# Patient Record
Sex: Male | Born: 1965 | Race: White | Hispanic: No
Health system: Southern US, Community
[De-identification: ages and names within clinical notes are randomized; demographics above are authoritative.]

## PROBLEM LIST (undated history)

## (undated) DIAGNOSIS — N179 Acute kidney failure, unspecified: Secondary | ICD-10-CM

## (undated) DIAGNOSIS — I10 Essential (primary) hypertension: Secondary | ICD-10-CM

## (undated) DIAGNOSIS — I639 Cerebral infarction, unspecified: Secondary | ICD-10-CM

---

## 1989-06-21 HISTORY — PX: BACK SURGERY: SHX140

## 1999-12-10 ENCOUNTER — Emergency Department (HOSPITAL_COMMUNITY): Admission: EM | Admit: 1999-12-10 | Discharge: 1999-12-10 | Payer: Self-pay | Admitting: Emergency Medicine

## 2000-10-31 ENCOUNTER — Emergency Department (HOSPITAL_COMMUNITY): Admission: EM | Admit: 2000-10-31 | Discharge: 2000-10-31 | Payer: Self-pay | Admitting: Emergency Medicine

## 2002-06-07 ENCOUNTER — Other Ambulatory Visit (HOSPITAL_COMMUNITY): Admission: RE | Admit: 2002-06-07 | Discharge: 2002-06-08 | Payer: Self-pay | Admitting: Psychiatry

## 2002-07-30 ENCOUNTER — Encounter: Payer: Self-pay | Admitting: Emergency Medicine

## 2002-07-30 ENCOUNTER — Emergency Department (HOSPITAL_COMMUNITY): Admission: EM | Admit: 2002-07-30 | Discharge: 2002-07-30 | Payer: Self-pay | Admitting: Emergency Medicine

## 2002-11-27 ENCOUNTER — Emergency Department (HOSPITAL_COMMUNITY): Admission: EM | Admit: 2002-11-27 | Discharge: 2002-11-27 | Payer: Self-pay | Admitting: Emergency Medicine

## 2002-11-27 ENCOUNTER — Encounter: Payer: Self-pay | Admitting: Emergency Medicine

## 2021-07-25 ENCOUNTER — Encounter (HOSPITAL_COMMUNITY): Payer: Self-pay | Admitting: Emergency Medicine

## 2021-07-25 ENCOUNTER — Emergency Department (HOSPITAL_COMMUNITY): Payer: Medicaid Other

## 2021-07-25 ENCOUNTER — Other Ambulatory Visit: Payer: Self-pay

## 2021-07-25 ENCOUNTER — Inpatient Hospital Stay (HOSPITAL_COMMUNITY)
Admission: EM | Admit: 2021-07-25 | Discharge: 2021-09-11 | DRG: 064 | Disposition: A | Discharge status: Skilled Nursing Facility | Payer: Medicaid Other | Attending: Internal Medicine | Admitting: Internal Medicine

## 2021-07-25 DIAGNOSIS — R0902 Hypoxemia: Secondary | ICD-10-CM | POA: Diagnosis not present

## 2021-07-25 DIAGNOSIS — I639 Cerebral infarction, unspecified: Principal | ICD-10-CM | POA: Diagnosis present

## 2021-07-25 DIAGNOSIS — F4323 Adjustment disorder with mixed anxiety and depressed mood: Secondary | ICD-10-CM | POA: Diagnosis not present

## 2021-07-25 DIAGNOSIS — E039 Hypothyroidism, unspecified: Secondary | ICD-10-CM

## 2021-07-25 DIAGNOSIS — E035 Myxedema coma: Secondary | ICD-10-CM | POA: Diagnosis present

## 2021-07-25 DIAGNOSIS — K219 Gastro-esophageal reflux disease without esophagitis: Secondary | ICD-10-CM | POA: Diagnosis present

## 2021-07-25 DIAGNOSIS — R471 Dysarthria and anarthria: Secondary | ICD-10-CM | POA: Diagnosis present

## 2021-07-25 DIAGNOSIS — G8194 Hemiplegia, unspecified affecting left nondominant side: Secondary | ICD-10-CM | POA: Diagnosis present

## 2021-07-25 DIAGNOSIS — I63411 Cerebral infarction due to embolism of right middle cerebral artery: Principal | ICD-10-CM | POA: Diagnosis present

## 2021-07-25 DIAGNOSIS — Z20822 Contact with and (suspected) exposure to covid-19: Secondary | ICD-10-CM | POA: Diagnosis present

## 2021-07-25 DIAGNOSIS — I10 Essential (primary) hypertension: Secondary | ICD-10-CM | POA: Diagnosis present

## 2021-07-25 DIAGNOSIS — E785 Hyperlipidemia, unspecified: Secondary | ICD-10-CM | POA: Diagnosis present

## 2021-07-25 DIAGNOSIS — M62838 Other muscle spasm: Secondary | ICD-10-CM | POA: Diagnosis not present

## 2021-07-25 DIAGNOSIS — I63511 Cerebral infarction due to unspecified occlusion or stenosis of right middle cerebral artery: Secondary | ICD-10-CM

## 2021-07-25 DIAGNOSIS — Z79899 Other long term (current) drug therapy: Secondary | ICD-10-CM

## 2021-07-25 DIAGNOSIS — R29706 NIHSS score 6: Secondary | ICD-10-CM | POA: Diagnosis present

## 2021-07-25 DIAGNOSIS — N179 Acute kidney failure, unspecified: Secondary | ICD-10-CM

## 2021-07-25 DIAGNOSIS — H04123 Dry eye syndrome of bilateral lacrimal glands: Secondary | ICD-10-CM | POA: Diagnosis not present

## 2021-07-25 DIAGNOSIS — R001 Bradycardia, unspecified: Secondary | ICD-10-CM | POA: Diagnosis not present

## 2021-07-25 DIAGNOSIS — Z91041 Radiographic dye allergy status: Secondary | ICD-10-CM

## 2021-07-25 DIAGNOSIS — F17210 Nicotine dependence, cigarettes, uncomplicated: Secondary | ICD-10-CM | POA: Diagnosis present

## 2021-07-25 DIAGNOSIS — R4182 Altered mental status, unspecified: Secondary | ICD-10-CM

## 2021-07-25 DIAGNOSIS — Z9114 Patient's other noncompliance with medication regimen: Secondary | ICD-10-CM

## 2021-07-25 DIAGNOSIS — K5900 Constipation, unspecified: Secondary | ICD-10-CM | POA: Diagnosis present

## 2021-07-25 DIAGNOSIS — M25512 Pain in left shoulder: Secondary | ICD-10-CM | POA: Diagnosis not present

## 2021-07-25 DIAGNOSIS — J309 Allergic rhinitis, unspecified: Secondary | ICD-10-CM | POA: Diagnosis present

## 2021-07-25 DIAGNOSIS — R339 Retention of urine, unspecified: Secondary | ICD-10-CM | POA: Diagnosis not present

## 2021-07-25 DIAGNOSIS — G4733 Obstructive sleep apnea (adult) (pediatric): Secondary | ICD-10-CM | POA: Diagnosis present

## 2021-07-25 DIAGNOSIS — R531 Weakness: Secondary | ICD-10-CM

## 2021-07-25 DIAGNOSIS — Z8249 Family history of ischemic heart disease and other diseases of the circulatory system: Secondary | ICD-10-CM

## 2021-07-25 DIAGNOSIS — Z8673 Personal history of transient ischemic attack (TIA), and cerebral infarction without residual deficits: Secondary | ICD-10-CM

## 2021-07-25 DIAGNOSIS — R29810 Facial weakness: Secondary | ICD-10-CM | POA: Diagnosis present

## 2021-07-25 DIAGNOSIS — T428X5A Adverse effect of antiparkinsonism drugs and other central muscle-tone depressants, initial encounter: Secondary | ICD-10-CM | POA: Diagnosis not present

## 2021-07-25 DIAGNOSIS — R0789 Other chest pain: Secondary | ICD-10-CM

## 2021-07-25 DIAGNOSIS — G928 Other toxic encephalopathy: Secondary | ICD-10-CM | POA: Diagnosis not present

## 2021-07-25 DIAGNOSIS — Q2112 Patent foramen ovale: Secondary | ICD-10-CM

## 2021-07-25 DIAGNOSIS — R4701 Aphasia: Secondary | ICD-10-CM | POA: Diagnosis present

## 2021-07-25 HISTORY — DX: Cerebral infarction, unspecified: I63.9

## 2021-07-25 HISTORY — DX: Essential (primary) hypertension: I10

## 2021-07-25 HISTORY — DX: Acute kidney failure, unspecified: N17.9

## 2021-07-25 LAB — COMPREHENSIVE METABOLIC PANEL
ALT: 27 U/L (ref 0–44)
AST: 32 U/L (ref 15–41)
Albumin: 4.2 g/dL (ref 3.5–5.0)
Alkaline Phosphatase: 62 U/L (ref 38–126)
Anion gap: 12 (ref 5–15)
BUN: 20 mg/dL (ref 6–20)
CO2: 25 mmol/L (ref 22–32)
Calcium: 9.3 mg/dL (ref 8.9–10.3)
Chloride: 103 mmol/L (ref 98–111)
Creatinine, Ser: 1.39 mg/dL — ABNORMAL HIGH (ref 0.61–1.24)
GFR, Estimated: 59 mL/min — ABNORMAL LOW (ref >60)
Glucose, Bld: 138 mg/dL — ABNORMAL HIGH (ref 70–99)
Potassium: 3.8 mmol/L (ref 3.5–5.1)
Sodium: 140 mmol/L (ref 135–145)
Total Bilirubin: 0.6 mg/dL (ref 0.3–1.2)
Total Protein: 7.4 g/dL (ref 6.5–8.1)

## 2021-07-25 LAB — I-STAT CHEM 8, ED
BUN: 24 mg/dL — ABNORMAL HIGH (ref 6–20)
Calcium, Ion: 0.94 mmol/L — ABNORMAL LOW (ref 1.15–1.40)
Chloride: 105 mmol/L (ref 98–111)
Creatinine, Ser: 1.2 mg/dL (ref 0.61–1.24)
Glucose, Bld: 141 mg/dL — ABNORMAL HIGH (ref 70–99)
HCT: 44 % (ref 39.0–52.0)
Hemoglobin: 15 g/dL (ref 13.0–17.0)
Potassium: 3.7 mmol/L (ref 3.5–5.1)
Sodium: 139 mmol/L (ref 135–145)
TCO2: 26 mmol/L (ref 22–32)

## 2021-07-25 LAB — CBC
HCT: 44.2 % (ref 39.0–52.0)
Hemoglobin: 14.2 g/dL (ref 13.0–17.0)
MCH: 31.3 pg (ref 26.0–34.0)
MCHC: 32.1 g/dL (ref 30.0–36.0)
MCV: 97.6 fL (ref 80.0–100.0)
Platelets: 219 10*3/uL (ref 150–400)
RBC: 4.53 MIL/uL (ref 4.22–5.81)
RDW: 14.4 % (ref 11.5–15.5)
WBC: 7.3 10*3/uL (ref 4.0–10.5)
nRBC: 0 % (ref 0.0–0.2)

## 2021-07-25 LAB — RAPID URINE DRUG SCREEN, HOSP PERFORMED
Amphetamines: POSITIVE — AB
Barbiturates: NOT DETECTED
Benzodiazepines: NOT DETECTED
Cocaine: NOT DETECTED
Opiates: NOT DETECTED
Tetrahydrocannabinol: NOT DETECTED

## 2021-07-25 LAB — RESP PANEL BY RT-PCR (FLU A&B, COVID) ARPGX2
Influenza A by PCR: NEGATIVE
Influenza B by PCR: NEGATIVE
SARS Coronavirus 2 by RT PCR: NEGATIVE

## 2021-07-25 LAB — DIFFERENTIAL
Abs Immature Granulocytes: 0.03 10*3/uL (ref 0.00–0.07)
Basophils Absolute: 0.1 10*3/uL (ref 0.0–0.1)
Basophils Relative: 1 %
Eosinophils Absolute: 0.1 10*3/uL (ref 0.0–0.5)
Eosinophils Relative: 2 %
Immature Granulocytes: 0 %
Lymphocytes Relative: 18 %
Lymphs Abs: 1.3 10*3/uL (ref 0.7–4.0)
Monocytes Absolute: 0.4 10*3/uL (ref 0.1–1.0)
Monocytes Relative: 6 %
Neutro Abs: 5.4 10*3/uL (ref 1.7–7.7)
Neutrophils Relative %: 73 %

## 2021-07-25 LAB — HEMOGLOBIN A1C
Hgb A1c MFr Bld: 5.9 % — ABNORMAL HIGH (ref 4.8–5.6)
Mean Plasma Glucose: 122.63 mg/dL

## 2021-07-25 LAB — LIPID PANEL
Cholesterol: 320 mg/dL — ABNORMAL HIGH (ref 0–200)
HDL: 50 mg/dL (ref >40)
LDL Cholesterol: 244 mg/dL — ABNORMAL HIGH (ref 0–99)
Total CHOL/HDL Ratio: 6.4 RATIO
Triglycerides: 132 mg/dL (ref <150)
VLDL: 26 mg/dL (ref 0–40)

## 2021-07-25 LAB — APTT: aPTT: 32 seconds (ref 24–36)

## 2021-07-25 LAB — PROTIME-INR
INR: 1 (ref 0.8–1.2)
Prothrombin Time: 12.8 seconds (ref 11.4–15.2)

## 2021-07-25 LAB — GLUCOSE, CAPILLARY: Glucose-Capillary: 107 mg/dL — ABNORMAL HIGH (ref 70–99)

## 2021-07-25 LAB — CBG MONITORING, ED: Glucose-Capillary: 134 mg/dL — ABNORMAL HIGH (ref 70–99)

## 2021-07-25 LAB — TROPONIN I (HIGH SENSITIVITY): Troponin I (High Sensitivity): 8 ng/L (ref <18)

## 2021-07-25 MED ORDER — ACETAMINOPHEN 650 MG RE SUPP: 650.0000 mg | Freq: Four times a day (QID) | RECTAL | Status: DC | PRN

## 2021-07-25 MED ORDER — ENOXAPARIN SODIUM 40 MG/0.4ML IJ SOSY
40.0000 mg | PREFILLED_SYRINGE | INTRAMUSCULAR | Status: DC
  Administered 2021-07-25: 40 mg via SUBCUTANEOUS
  Filled 2021-07-25: qty 0.4

## 2021-07-25 MED ORDER — ASPIRIN 81 MG PO CHEW: 81.0000 mg | CHEWABLE_TABLET | Freq: Every day | ORAL | Status: DC

## 2021-07-25 MED ORDER — ASPIRIN 81 MG PO CHEW
324.0000 mg | CHEWABLE_TABLET | Freq: Once | ORAL | Status: AC
  Administered 2021-07-25: 324 mg via ORAL

## 2021-07-25 MED ORDER — CLOPIDOGREL BISULFATE 300 MG PO TABS
300.0000 mg | ORAL_TABLET | Freq: Once | ORAL | Status: AC
  Administered 2021-07-25: 300 mg via ORAL
  Filled 2021-07-25: qty 1

## 2021-07-25 MED ORDER — ACETAMINOPHEN 325 MG PO TABS
650.0000 mg | ORAL_TABLET | Freq: Four times a day (QID) | ORAL | Status: DC | PRN
  Administered 2021-07-25 – 2021-08-30 (×24): 650 mg via ORAL
  Filled 2021-07-25 (×24): qty 2

## 2021-07-25 MED ORDER — GABAPENTIN 600 MG PO TABS
300.0000 mg | ORAL_TABLET | Freq: Three times a day (TID) | ORAL | Status: DC
  Filled 2021-07-25 (×2): qty 0.5

## 2021-07-25 MED ORDER — SODIUM CHLORIDE 0.9% FLUSH
3.0000 mL | Freq: Once | INTRAVENOUS | Status: AC
  Administered 2021-07-25: 3 mL via INTRAVENOUS

## 2021-07-25 MED ORDER — CYCLOBENZAPRINE HCL 10 MG PO TABS
5.0000 mg | ORAL_TABLET | Freq: Every day | ORAL | Status: DC | PRN
  Administered 2021-07-25 – 2021-07-27 (×3): 5 mg via ORAL
  Filled 2021-07-25 (×3): qty 1

## 2021-07-25 MED ORDER — ASPIRIN 81 MG PO CHEW
81.0000 mg | CHEWABLE_TABLET | Freq: Every day | ORAL | Status: DC
  Administered 2021-07-26 – 2021-09-11 (×48): 81 mg via ORAL
  Filled 2021-07-25 (×48): qty 1

## 2021-07-25 MED ORDER — CLOPIDOGREL BISULFATE 75 MG PO TABS
75.0000 mg | ORAL_TABLET | Freq: Every day | ORAL | Status: AC
  Administered 2021-07-26 – 2021-08-15 (×21): 75 mg via ORAL
  Filled 2021-07-25 (×21): qty 1

## 2021-07-25 MED ORDER — GABAPENTIN 300 MG PO CAPS
300.0000 mg | ORAL_CAPSULE | Freq: Three times a day (TID) | ORAL | Status: DC
  Administered 2021-07-25 – 2021-08-01 (×22): 300 mg via ORAL
  Filled 2021-07-25 (×22): qty 1

## 2021-07-25 MED ORDER — ATORVASTATIN CALCIUM 80 MG PO TABS
80.0000 mg | ORAL_TABLET | Freq: Every day | ORAL | Status: DC
  Administered 2021-07-26 – 2021-09-11 (×48): 80 mg via ORAL
  Filled 2021-07-25 (×48): qty 1

## 2021-07-25 MED ORDER — ASPIRIN 81 MG PO CHEW: 81.0000 mg | CHEWABLE_TABLET | Freq: Once | ORAL | Status: DC

## 2021-07-25 NOTE — ED Notes (Signed)
The pt just returned from [facility]  admitting doctors met the pt

## 2021-07-25 NOTE — Progress Notes (Signed)
Patient received to room [phone].  Is A&O x 4.  Introduced to Education officer, community and to routine.  Bed in low position.  Brakes on and high fall risk protocol initiated.  Pt states is incontinent of urine.  Male external catheter in place and functioning.

## 2021-07-25 NOTE — Hospital Course (Addendum)
Feels fine today. Still having muscle spasms in his legs.   Says PT thinks he is getting better, but he does not. 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  Patient noted onset of symptoms the evening prior to presentation however did not come to the hospital until left extremity weakness continued to progress.  On presentation neurology was consulted for code stroke.  Patient was outside the thrombolytic window, however was loaded with aspirin and Plavix.  MRI positive for scattered acute infarcts concerning for possible embolic process.  Multiple chronic infarcts and chronic ischemic disease also noted.  No evidence of large vessel occlusion on MRA.  Several areas of intracranial stenosis, most severe in branches of the right and left PCA.  Patient hypertensive on arrival, not treated given need for permissive hypertension.  Heart rate in the 50s to 60s.  EKG sinus rhythm.  On this morning's evaluation blood pressure has normalized without intervention.  Hemoglobin A1c in the prediabetic range at 5.9%.  Lipid panel with elevated cholesterol at 320, LDL elevated at 244, HDL 50.  TSH significantly elevated at 41 with free T4 undetectable.  Creatinine elevated at 1.39 on admission with GFR 59.  Not significantly changed on this morning's labs, however unclear patient's baseline due to limited prior records although suspect this may be patient's baseline given no significant reason for AKI noted.  UDS positive for amphetamines, patient reported taking Adderall several days ago. Started on high intensity statin for hyperlipidemia.  We will hold off on additional blood pressure medication given normal pressures and carefully lowering  pressure in the setting of stroke.  Neuro exam still notable for left facial droop, dysarthria, significant weakness of left upper and lower extremities this morning.  Able to minimally move left upper, no movement noted of the left lower extremity.  PT/OT/speech ordered.  - TEE revealing large PFO with R to L shunting; no LA/LAA thrombus or masses. ROPE score 4, not ideal candidate for closure. Plans for pt to f/u with Dr Excell Seltzer as an OP. Plan to 30 day loop recording.  Denied by [facility] d/t poor support at home.  Did have an incidence of altered mentation including repetitive speech and worsening aphasia from baseline about 1 week post stroke; Pt oriented to person and place but not time. Code stroke called. Repeat CT imaging negative for hemorrhage. MRI brain and MRA head and neck, and EEG ordered. MRI brain showed New small acute cortical infarct at the right temporal operculum, but stroke team not convinced this was cause of acute change. Believe that recent increase in baclofen could by cause of acute toxic encephalopathy given sudden onset of confusion, disorientation, hallucinations, and paranoia. Discontinued baclofen and gabapentin; labs reassuring and CXR negative, and then slowly resumed baclofen given ongoing muscle spasm.  - Aspirin 81mg  daily - Clopidogrel 75mg  daily for 3 weeks - Atorvastatin 80 mg  - PT/OT/SLP Pt discharged to SNF. ***  HTN Difficult to manage HTN after stroke that required up-titration of Lisinopril from 20 to 40, addition and up-titration of amlodipine from 5 to 10, and then addition of HCTZ 12.5 daily. HCTZ was discontinued d/t AKI after starting and substituted with Hydral 10 mg TID. HR did not allow for starting a BB.  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 Tx for possible MC with IV steroids and IV synthroid. Improved lethargy and bradycardia with IV synthroid. Next day AM cortisol elevated, D/C IV steroids.  Free T4 improved to 0.41 with IV Synthroid. Improved lethargy and bradycardia, and transitioned to PO synthroid today.  - PO synthroid 100 mcg starting tmrw  - Recheck TSH and free T4 in 4-6 weeks  Chronic pain  Muscle spasm  Complaining of lower leg muscle spasms since his stroke. Tried Baclofen and Flexeril with minimal improvement. Spoke with stroke team, advised to increase and baclofen dose and to schedule it. Given net onset confusion, baclofen and gabapentin discontinued.

## 2021-07-25 NOTE — ED Notes (Signed)
The pt is c/o an increase of the spasm of his lt leg  the pt reports that  the movement is getting more frequent and it hurts

## 2021-07-25 NOTE — Consult Note (Signed)
Encountered pt's daughter in hall, lost and looking for her father's room. Encouraged her and showed the way. Will ask day chaplain to F/U tomorrow.  Rev. Donnel Saxon Chaplain

## 2021-07-25 NOTE — ED Notes (Signed)
Patient transported to MRI 

## 2021-07-25 NOTE — ED Notes (Signed)
Pt still in MRI at this time

## 2021-07-25 NOTE — ED Notes (Signed)
I have paged x 2 in the past 20-30 minutes  for the pt who is almost crying with cramps in his lt leg and spasms  the pt needs med for this pain

## 2021-07-25 NOTE — Progress Notes (Addendum)
° °  Subjective Placed on 2L O2 by RN due to de-saturation 84-88% at times during apnea overnight. Oxygen saturation remained 92% or higher with 2L of supplemental oxygen. Found to be retaining urine, with bladder scan showing , refused in and out. Agreed to condom cath this AM.   Patient was seen at bedside during rounds today. He is sleepy but awakes to voice. He has noticed no improvement in weakness. He denies any confusion.   Pt is updated on the plan for today, and all questions and concerns are addressed.   Objective:  Vital signs in last 24 hours: Vitals:   07/26/21 0427 07/26/21 0430 07/26/21 0826 07/26/21 0900  BP: (!) 166/83 (!) 166/83 119/78   Pulse: (!) 54 (!) 54 (!) 51   Resp: 12 16 10    Temp:  98.2 F (36.8 C) 98.3 F (36.8 C)   TempSrc:  Axillary Axillary   SpO2: 99% 99% 100% 98%  Weight:      Height:       Constitutional: alert, well-appearing, in NAD  Cardiovascular: RRR, no m/r/g, non-edematous bilateral LE Pulmonary/Chest: normal work of breathing on room air, LCTAB Abdominal: soft, non-tender to palpation, non-distended MSK: normal bulk and tone Skin: warm and dry Neurological: A&O x 3, follows commands Left facial droop, tongue deviates slightly the left, slurred speech. Unable to move left upper and lower extremities and 5/5 in upper and lower right extremities. Good hand grip bilaterally. Sensation intact.  Assessment/Plan:  Principal Problem:   Left-sided weakness Active Problems:   Stroke [facility])   Multifocal ischemic brain infarcts Suspecting embolic stroke Suspect embolic etiology given MRI brain with acute infarcts within the right frontal lobe, right corona radiata/basal ganglia, left corona radiata, medial left temporal lobe/hippocampus and left aspect of the callosal genu. MRA head negative for large vessel occlusion. MRA neck without significant stenosis. DVT study negative. LDL 244 and A1c 5.9. Exam largely unchanged from yesterday. BP  119/78, will hold off on any antihypertensives at this time.  - Neurology following, appreciated assistance - Aspirin 81mg  daily - Clopidogrel 75mg  daily for 3 weeks - Atorvastatin 80 mg  - Allow for permissive HTN first 24 hrs  - Echo pending  - PT/OT/SLP   Uncontrolled hypothyroidism TSH 41, with Free T4 <0.25. Self reports that he previously had thyroid ablation d/t "cancer" and was taking "medication" after that until he could no longer afford it. Uncontrolled hypothyroidism can explain his elevated LDL.  - Started synthroid 75 mcg  - Recheck TSH in 4-6 weeks  Urinary retention Retained 400cc last night, but refused in and out. Pt agreeable to condom cath this AM. Denies abdominal pain.  - Condom cath  Chronic pain  Muscle spasm  - Continue gabapentin 300mg  tid  - Continue Flexeril 5mg  prn    Best Practice: Diet: NPO IVF: None,None VTE: Levenox 40 Code: Full   [facility], MD  Internal Medicine Resident, PGY-1 Pager: [phone] After 5pm on weekdays and 1pm on weekends: On Call pager [phone]

## 2021-07-25 NOTE — ED Triage Notes (Signed)
Patient BIB [facility] from home. Complaint of left facial droop, and left sided weakness that started 1700 last night. VSS. HX TIA, HTN.

## 2021-07-25 NOTE — ED Provider Notes (Signed)
[facility] Provider Note   CSN: [id number] Arrival date & time: 07/25/21  1155  An emergency department physician performed an initial assessment on this suspected stroke patient at 1158.  History  CC: Left sided paresthesias   Jason SaferCharles K Figueroa is a 56 y.o. male who reports a history of TIA, presenting from home by EMS for concern for left-sided paresthesias.  Patient reports around 5 PM yesterday he noted some tingling and numbness on the left half of his face.  It then extended to involve the left arm and the left leg, which he feels are weaker than normal.  His symptoms have persisted overnight.  He denies headache, denies lightheadedness, denies chest pressure.  He arrives as a code stroke  He states that he takes 81 mg of aspirin daily but no blood thinner  He does report iodine "allergies" with "my neck starts to feel hot and my blood pressure shoots up and I start to feel crazy".  HPI     Home Medications Prior to Admission medications   Medication Sig Start Date End Date Taking? Authorizing Provider  Aromatic Inhalants ([employer] NASAL DECONGESTANT IN) Inhale 1 spray into the lungs as needed (congestion).   Yes [provider]  gabapentin (NEURONTIN) 600 MG tablet Take 600 mg by mouth daily.   Yes [provider]  lisinopril (ZESTRIL) 20 MG tablet Take 20 mg by mouth daily.   Yes [provider]  lisinopril (ZESTRIL) 40 MG tablet Take 40 mg by mouth daily.   Yes [provider]      Allergies    Ivp dye [iodinated contrast media]    Review of Systems   Review of Systems  Physical Exam Updated Vital Signs BP (!) 179/96    Pulse (!) 50    Temp 98 F (36.7 C) (Oral)    Resp 11    Ht 5\' 9"  (1.753 m)    Wt 89.3 kg    SpO2 93%    BMI 29.07 kg/m  Physical Exam Constitutional:      General: He is not in acute distress. HENT:     Head: Normocephalic and atraumatic.  Eyes:     Conjunctiva/sclera:  Conjunctivae normal.     Pupils: Pupils are equal, round, and reactive to light.  Cardiovascular:     Rate and Rhythm: Normal rate and regular rhythm.  Pulmonary:     Effort: Pulmonary effort is normal. No respiratory distress.  Abdominal:     General: There is no distension.     Tenderness: There is no abdominal tenderness.  Skin:    General: Skin is warm and dry.  Neurological:     Mental Status: He is alert and oriented to person, place, and time.     Motor: No weakness.     Comments: Patient reporting left-sided face, left arm and leg paresthesia    ED Results / Procedures / Treatments   Labs (all labs ordered are listed, but only abnormal results are displayed) Labs Reviewed  COMPREHENSIVE METABOLIC PANEL - Abnormal; Notable for the following components:      Result Value   Glucose, Bld 138 (*)    Creatinine, Ser 1.39 (*)    GFR, Estimated 59 (*)    All other components within normal limits  I-STAT CHEM 8, ED - Abnormal; Notable for the following components:   BUN 24 (*)    Glucose, Bld 141 (*)    Calcium, Ion 0.94 (*)  All other components within normal limits  CBG MONITORING, ED - Abnormal; Notable for the following components:   Glucose-Capillary 134 (*)    All other components within normal limits  RESP PANEL BY RT-PCR (FLU A&B, COVID) ARPGX2  PROTIME-INR  APTT  CBC  DIFFERENTIAL  RAPID URINE DRUG SCREEN, HOSP PERFORMED  TROPONIN I (HIGH SENSITIVITY)    EKG EKG Interpretation  Date/Time:  Saturday July 25 2021 12:17:49 EST Ventricular Rate:  57 PR Interval:  167 QRS Duration: 103 QT Interval:  437 QTC Calculation: 426 R Axis:   105 Text Interpretation: Sinus rhythm No STEMI, no prior tracing on MUSE for comparison Confirmed by Alvester Chou [phone]) on 07/25/2021 12:35:19 PM  Radiology CT HEAD CODE STROKE WO CONTRAST  Result Date: 07/25/2021 CLINICAL DATA:  Code stroke. Provided history: Neuro deficit, acute, stroke suspected. EXAM: CT HEAD  WITHOUT CONTRAST TECHNIQUE: Contiguous axial images were obtained from the base of the skull through the vertex without intravenous contrast. RADIATION DOSE REDUCTION: This exam was performed according to the departmental dose-optimization program which includes automated exposure control, adjustment of the mA and/or kV according to patient size and/or use of iterative reconstruction technique. COMPARISON:  No pertinent prior exams available for comparison. FINDINGS: Brain: Cerebral volume is normal for age. Patchy and ill-defined hypoattenuation within the cerebral white matter, nonspecific but compatible with chronic small vessel ischemic disease. Small age-indeterminate lacunar infarcts within the bilateral basal ganglia, right thalamus and right pons. There is no acute intracranial hemorrhage. No demarcated cortical infarct. No extra-axial fluid collection. No evidence of an intracranial mass. No midline shift. Vascular: No hyperdense vessel.  Atherosclerotic calcifications. Skull: Normal. Negative for fracture or focal lesion. Sinuses/Orbits: Visualized orbits show no acute finding. Small volume frothy secretions within a posterior right ethmoid air cell. Trace thickening elsewhere within the bilateral ethmoid sinuses. Small volume frothy secretions within the left sphenoid sinus. ASPECTS ([place]berta Stroke Program Early CT Score) - Ganglionic level infarction (caudate, lentiform nuclei, internal capsule, insula, M1-M3 cortex): 6 - Supraganglionic infarction (M4-M6 cortex): 3 Total score (0-10 with 10 being normal): 9 These results were communicated to Dr. Thomasena Edis At 12:14 pmon 2/4/2023by text page via the [facility] messaging system. IMPRESSION: No acute intracranial hemorrhage or acute demarcated cortical infarction. Age-indeterminate lacunar infarcts within the bilateral basal ganglia, right thalamus and right pons. Chronic small vessel ischemic changes within the cerebral white matter. Electronically Signed   By:  Jackey Loge D.O.   On: 07/25/2021 12:14    Procedures Procedures    Medications Ordered in ED Medications  clopidogrel (PLAVIX) tablet 300 mg (has no administration in time range)  sodium chloride flush (NS) 0.9 % injection 3 mL (3 mLs Intravenous Given 07/25/21 1225)  aspirin chewable tablet 324 mg (324 mg Oral Given 07/25/21 1223)    ED Course/ Medical Decision Making/ A&P Clinical Course as of 07/25/21 1445  Sat Jul 25, 2021  1211 Accucheck wnl on arrival [MT]  1235 Neurology is recommending a Plavix on a dose of 200 mg as well as aspirin, admission to the hospitalist for MRI of the brain, MRA of the head and neck (ordered), and stroke workup [MT]  1251 Pt not a candidate for tpa with timing & onset of symptoms [MT]  1347 Will repage as unassigned - no PCP [MT]  1409 Admitted to IM service. [MT]  1410 Aspirin already given on arrival - I cancelled addition 81 mg ordered here [MT]    Clinical Course User Index [MT] Terald Sleeper,  MD                           Medical Decision Making Amount and/or Complexity of Data Reviewed Labs: ordered. Radiology: ordered.  Risk Prescription drug management. Decision regarding hospitalization.   Patient is here with acute left-sided paresthesias of the face, arm and leg.  Differential would include CVA versus TIA versus other.  He has no chest pain or pressure to suggest ACS or aortic dissection.  Neurology was consulted regarding this patient's presentation, recommend CT imaging initially, which did show likely acute infarct consistent with the patient's symptoms.  Neurologist subsequently recommended medical admission for MRI and MRI and further stroke work-up.  I reviewed interpreted the remainder the patient's labs.  COVID and flu are negative.  BMP and CBC largely unremarkable.  Creatinine 1.4.  EKG shows normal sinus rhythm without acute ischemic findings per my interpretation.  CT scan of the brain reviewed and interpreted and  agree with radiology interpretation, with age-indeterminate lacunar infarcts.  Patient was admitted to the medical team.  Transition of care discussed with the internal medicine service         Final Clinical Impression(s) / ED Diagnoses Final diagnoses:  Cerebrovascular accident (CVA), unspecified mechanism [facility])    Rx / DC Orders ED Discharge Orders     None         Chauncey Sciulli, Kermit Balo, MD 07/25/21 1445

## 2021-07-25 NOTE — ED Notes (Signed)
I received  a call back from the admitting doctor.. med given now  pts cramps have not subsided

## 2021-07-25 NOTE — Consult Note (Addendum)
Neurology Consult H&P  Jason Figueroa MR# [id number] 07/25/2021   CC: 56 year old male patient with history of HTN , previous stroke and recent TIA who was brought in to [facility] ED as a Code Stroke via EMS for complaint of tingling and numbness on left side of his face that progressed to left facial droop since yesterday at 5pm. These symptoms worsened over the night and this am he noted Left arm and leg weakness today. He had a TIA some months ago and unsure if these are similar symptoms. His leg weakness had resolved by the time he was in CT scan. He denies headache, denies lightheadedness, denies chest pressure  History is obtained from: Patient, ED physician, EMS and chart.  HPI: Jason Figueroa is a 56 y.o. male PMHx of HTN, previous stroke and recent TIA. I was unable to find any further hx of his stroke ( other than confirmed old lacunar strokes on today's [facility]) or TIA in care everywhere. Neurology is consulted for code stroke.   Met patient at ED doors with team. NIHSS- 6 with left sided deficits. Patient alert and oriented x3. BP 196/108 at that time. He says he took ASA 77m last night and not on AC.  Of note he says he has an iodinated contrast allergy or intolerance that caused him flushing of the neck, HTN and he "started to feel crazy".    LKW: 500pm 07/24/21 tNK given: No  IR Thrombectomy No, not indicated Modified Rankin Scale: 4-Needs assistance to walk and tend to bodily needs NIHSS: 6 Interval: Initial (02/04 1217) Level of Consciousness (1a.)   : Alert, keenly responsive (02/04 1217) LOC Questions (1b. )   +: Answers both questions correctly (02/04 1217) LOC Commands (1c. )   + : Performs both tasks correctly (02/04 1217) Best Gaze (2. )  +: Normal (02/04 1217) Visual (3. )  +: No visual loss (02/04 1217) Facial Palsy (4. )    : Minor paralysis (02/04 1217) Motor Arm, Left (5a. )   +: No effort against gravity (02/04 1217) Motor Arm, Right (5b. )   +: No drift (02/04  1217) Motor Leg, Left (6a. )   +: Some effort against gravity (02/04 1217) Motor Leg, Right (6b. )   +: No drift (02/04 1217) Limb Ataxia (7. ): Absent (02/04 1217) Sensory (8. )   +: Normal, no sensory loss (02/04 1217) Best Language (9. )   +: No aphasia (02/04 1217) Dysarthria (10. ): Normal (02/04 1217) Extinction/Inattention (11.)   +: No Abnormality (02/04 1217) Modified SS Total  +: 5 (02/04 1217) Complete NIHSS TOTAL: 6 (02/04 1217)  ROS: A complete ROS was performed and is negative except as noted in the HPI.    Past Medical History:  Diagnosis Date   Hypertension    Stroke ([facility]      No family history on file.  Social History:  reports that he has been smoking cigarettes. He has never used smokeless tobacco. No history on file for alcohol use and drug use.   Prior to Admission medications   Not on File    Exam: Current vital signs: Ht _0  (1.753 m)    Wt 89.3 kg    BMI 29.07 kg/m   Physical Exam  Constitutional: Appears well-developed and well-nourished.  Psych: Affect appropriate to situation Eyes: No scleral injection HENT: No OP obstruction. Head: Normocephalic.  Cardiovascular: Normal rate and regular rhythm.  Respiratory: Effort normal, symmetric excursions bilaterally,  no audible wheezing. GI: Soft.  No distension. There is no tenderness.  Skin: WDI  Neuro: Mental Status: Patient is awake, alert, oriented to person, place, month, year, and situation. Patient is able to give a clear and coherent history. Speech is fluent, intact comprehension and repetition. No signs of aphasia or neglect. Visual Fields are full. Pupils are equal, round, and reactive to light. EOMI without ptosis or diploplia.  Facial sensation is asymmetric to temperature Facial movement is asymmetric.  Hearing is intact to voice. Uvula midline and palate elevates symmetrically. Shoulder shrug is symmetric. Tongue is midline without atrophy or fasciculations.  Tone is  normal. Bulk is normal. 5/5 strength was present in all four extremities except left arm.  Sensation is symmetric to light touch and temperature in the arms and legs. Toes are downgoing bilaterally. FNF and HKS are intact bilaterally. Gait - Deferred  I have reviewed labs in epic and the pertinent results are: Creatinine 1.39, BUN 24, GFR 59.  I have reviewed the images obtained: NCT head showed No acute intracranial hemorrhage or acute demarcated cortical infarction.Age-indeterminate lacunar infarcts within the bilateral basal ganglia, right thalamus and right pons.Chronic small vessel ischemic changes within the cerebral white matter.  Assessment: Jason Figueroa is a 56 y.o. male PMHx of HTN, prior lacunar strokes and recent TIA, brought in to [facility] ED as a Code Stroke via EMS for complaint of tingling and numbness on left side of his face that progressed to left facial droop since yesterday at 5pm. These symptoms worsened over the night and this am he noted Left arm and leg weakness today. He had a TIA some months ago and unsure if these are similar symptoms. His leg weakness had resolved by the time he was in CT scan. He denied headache,lightheadedness or chest pressure.   Impression:  Right MCA territory stroke likely small vessel disease   Plan: - MRI brain without contrast. - Vascular imaging with MRA head and neck  - Recommend TTE. - Recommend labs: HbA1c, lipid panel, TSH. - Recommend Statin if LDL > 70 - Recommended load with aspirin 351m and Plavix 3091mnow. - Aspirin 8110maily. - Clopidogrel 87m79mily for 3 weeks. - Permissive hypertension first 24 h < 220/110.  - Telemetry monitoring for arrhythmia. - Recommend bedside Swallow screen. - Recommend Stroke education. - Recommend PT/OT/SLP consult.  This patient is critically ill and at significant risk of neurological worsening, death and care requires constant monitoring of vital signs, hemodynamics,respiratory and  cardiac monitoring, neurological assessment, discussion with family, other specialists and medical decision making of high complexity. I spent 70 minutes of neurocritical care time  in the care of  this patient. This was time spent independent of any time provided by nurse practitioner or PA.  Electronically signed by:  HuntLynnae Sandhoff Page: [id number]/2023, 12:17 PM NP co-rounder: JessParke Poisson  If 7pm- 7am, please page neurology on call as [place] [facility]

## 2021-07-25 NOTE — H&P (Addendum)
Date: 07/25/2021               Patient Name:  Jason Figueroa MRN: [id number]  DOB: 11/17/1965 Age / Sex: 56 y.o., male   PCP: Pcp, No         Medical Service: Internal Medicine Teaching Service         Attending Physician: Dr. Saverio Danker     First Contact: Lajean Manes, MD Pager: [phone]  Second Contact: Rick Duff, MD Pager: [phone]       After Hours (After 5p/  First Contact Pager: [phone]  weekends / holidays): Second Contact Pager: [phone]    Chief Complaint: Weakness   History of Present Illness:  Jason Figueroa is a 56 year old male patient with pertinent PMHx of HTN, previous stroke,  and recent TIA who was brought in to [facility] ED as a Code Stroke via EMS for complaint of tingling and numbness on left side of his face that progressed to left facial droop since yesterday at 5pm. He reports that these symptoms continued to progress throughout the night, and this morning he noticed left arm and leg weakness. Neurology was consulted for code stroke.   Initial ED workup with BP 196/108. CTH showing no acute intracranial hemorrhage or acute demarcated cortical infarction; age-indeterminate lacunar infarcts within the bilateral basal ganglia, right thalamus and right pons; chronic small vessel ischemic changes within the cerebral white matter. ASA and Plavix given. IMTS called for admission for further stroke workup.   He denies headaches, vision changes, lightheadedness, chest pain, SHOB, abdominal pain, nausea and vomiting.   He has a hx of previous strokes and TIA, but unable to find any further hx of his stroke or TIA via Care Everywhere, but CT imaging does confirm old lacunar strokes. Per chart review, after his TIA, he was instructed to continue taking ASA, but he has not been doing this.   Meds:  Poor medication compliance; reports taking his mother's Lisinopril 20 or 40 from "time to time"  No current facility-administered medications on file prior to encounter.    Current Outpatient Medications on File Prior to Encounter  Medication Sig Dispense Refill   Aromatic Inhalants ([employer] NASAL DECONGESTANT IN) Inhale 1 spray into the lungs as needed (congestion).     gabapentin (NEURONTIN) 600 MG tablet Take 600 mg by mouth daily.     lisinopril (ZESTRIL) 20 MG tablet Take 20 mg by mouth daily.     lisinopril (ZESTRIL) 40 MG tablet Take 40 mg by mouth daily.       Allergies: Allergies as of 07/25/2021 - Review Complete 07/25/2021  Allergen Reaction Noted   Ivp dye [iodinated contrast media] Other (See Comments) 07/25/2021   Past Medical History:  Diagnosis Date   Hypertension    Stroke [facility])     Family History:  History reviewed. No pertinent family history.  Mother: HTN Father: unknown  Social History:   Smokes 1ppd x 20 years  Denies alcohol use Reports taking adderall 3 days ago. Previously used cocaine, last use "years ago" IADLs/ADLs- can person independently at baseline   Review of Systems: A complete ROS was negative except as per HPI.    Physical Exam: Blood pressure (!) 183/117, pulse (!) 45, temperature 98 F (36.7 C), temperature source Oral, resp. rate (!) 9, height 5\' 9"  (1.753 m), weight 89.3 kg, SpO2 96 %.  Constitutional: alert, well-appearing, in NAD  Cardiovascular: RRR, no m/r/g, non-edematous bilateral LE Pulmonary/Chest: normal work of breathing  on room air, LCTAB Abdominal: soft, non-tender to palpation, non-distended MSK: normal bulk and tone Skin: warm and dry Neurological: A&O x 3, follows commands Pupils equal and reactive. Left facial droop, tongue deviates to the left, slurred speech. Unabel to move left upper and lower extremities, and 5/5 in upper and lower right extremities. Good hand grip bilaterally. Sensation intact.    EKG: SR  CTH: No acute intracranial hemorrhage or acute demarcated cortical infarction. Age-indeterminate lacunar infarcts within the bilateral basal ganglia, right thalamus and  right pons. Chronic small vessel ischemic changes within the cerebral white matter.  Assessment & Plan by Problem: Principal Problem:   Left-sided weakness Active Problems:   Stroke ([place])  Acute ischemic brain infarction   Hx of previous CVA Hx of HTN  Hx of medication non-adherence    Hx of HTN and prior stroke/TIA, and medication noncompliance. Reports initial left-sided facial tingling and numbness that progressed to left facial droop since 5 pm on 07/24/21, with noticeable left arm and leg weakness the next morning. UDS positive for amphetamines. CTH consistent with age-indeterminate lacunar infarcts within the bilateral basal ganglia, right thalamus and right pons. Outside the window for thrombolytics, and exam was not suggestive of large vessel occlusion. Suspect 2/2 medication non-compliance and drug use. Loaded with aspirin 325mg  and Plavix 300mg .  - Neurology following, appreciated assistance  - Allow for permissive HTN first 24 h < 220/110 - MRI brain without contrast ordered  - MRA head and neck ordered  - Echo pending  - A1c pending  - Lipid panel pending  - TSH pending  - Aspirin 81mg  daily - Clopidogrel 75mg  daily for 3 weeks - PT/OT/SLP   Best Practice: Diet: NPO IVF: None,None VTE: Levenox 40 Code: Full   Lajean Manes, MD  Internal Medicine Resident, PGY-1 Pager: [phone] 4:47 PM, 07/25/2021

## 2021-07-25 NOTE — ED Notes (Signed)
EDP, Trifan, and RN, Thurmond Butts, aware of pts heart rate.

## 2021-07-26 ENCOUNTER — Inpatient Hospital Stay (HOSPITAL_COMMUNITY): Payer: Medicaid Other

## 2021-07-26 ENCOUNTER — Observation Stay (HOSPITAL_COMMUNITY): Payer: Medicaid Other

## 2021-07-26 DIAGNOSIS — G928 Other toxic encephalopathy: Secondary | ICD-10-CM | POA: Diagnosis not present

## 2021-07-26 DIAGNOSIS — K219 Gastro-esophageal reflux disease without esophagitis: Secondary | ICD-10-CM | POA: Diagnosis present

## 2021-07-26 DIAGNOSIS — F17210 Nicotine dependence, cigarettes, uncomplicated: Secondary | ICD-10-CM | POA: Diagnosis not present

## 2021-07-26 DIAGNOSIS — J309 Allergic rhinitis, unspecified: Secondary | ICD-10-CM | POA: Diagnosis present

## 2021-07-26 DIAGNOSIS — K5900 Constipation, unspecified: Secondary | ICD-10-CM | POA: Diagnosis present

## 2021-07-26 DIAGNOSIS — R0902 Hypoxemia: Secondary | ICD-10-CM | POA: Diagnosis not present

## 2021-07-26 DIAGNOSIS — E7800 Pure hypercholesterolemia, unspecified: Secondary | ICD-10-CM

## 2021-07-26 DIAGNOSIS — N179 Acute kidney failure, unspecified: Secondary | ICD-10-CM | POA: Diagnosis not present

## 2021-07-26 DIAGNOSIS — I639 Cerebral infarction, unspecified: Secondary | ICD-10-CM | POA: Diagnosis present

## 2021-07-26 DIAGNOSIS — I10 Essential (primary) hypertension: Secondary | ICD-10-CM

## 2021-07-26 DIAGNOSIS — R4701 Aphasia: Secondary | ICD-10-CM | POA: Diagnosis not present

## 2021-07-26 DIAGNOSIS — T428X5A Adverse effect of antiparkinsonism drugs and other central muscle-tone depressants, initial encounter: Secondary | ICD-10-CM | POA: Diagnosis not present

## 2021-07-26 DIAGNOSIS — R339 Retention of urine, unspecified: Secondary | ICD-10-CM | POA: Diagnosis not present

## 2021-07-26 DIAGNOSIS — Z79899 Other long term (current) drug therapy: Secondary | ICD-10-CM | POA: Diagnosis not present

## 2021-07-26 DIAGNOSIS — Q2112 Patent foramen ovale: Secondary | ICD-10-CM | POA: Diagnosis not present

## 2021-07-26 DIAGNOSIS — I63411 Cerebral infarction due to embolism of right middle cerebral artery: Secondary | ICD-10-CM | POA: Diagnosis not present

## 2021-07-26 DIAGNOSIS — G4733 Obstructive sleep apnea (adult) (pediatric): Secondary | ICD-10-CM | POA: Diagnosis present

## 2021-07-26 DIAGNOSIS — R471 Dysarthria and anarthria: Secondary | ICD-10-CM | POA: Diagnosis present

## 2021-07-26 DIAGNOSIS — R29706 NIHSS score 6: Secondary | ICD-10-CM | POA: Diagnosis present

## 2021-07-26 DIAGNOSIS — E035 Myxedema coma: Secondary | ICD-10-CM | POA: Diagnosis not present

## 2021-07-26 DIAGNOSIS — F4323 Adjustment disorder with mixed anxiety and depressed mood: Secondary | ICD-10-CM | POA: Diagnosis not present

## 2021-07-26 DIAGNOSIS — I6340 Cerebral infarction due to embolism of unspecified cerebral artery: Secondary | ICD-10-CM

## 2021-07-26 DIAGNOSIS — Z91041 Radiographic dye allergy status: Secondary | ICD-10-CM | POA: Diagnosis not present

## 2021-07-26 DIAGNOSIS — Z20822 Contact with and (suspected) exposure to covid-19: Secondary | ICD-10-CM | POA: Diagnosis not present

## 2021-07-26 DIAGNOSIS — G8194 Hemiplegia, unspecified affecting left nondominant side: Secondary | ICD-10-CM | POA: Diagnosis not present

## 2021-07-26 DIAGNOSIS — E785 Hyperlipidemia, unspecified: Secondary | ICD-10-CM | POA: Diagnosis present

## 2021-07-26 DIAGNOSIS — E039 Hypothyroidism, unspecified: Secondary | ICD-10-CM | POA: Diagnosis not present

## 2021-07-26 DIAGNOSIS — I6389 Other cerebral infarction: Secondary | ICD-10-CM

## 2021-07-26 DIAGNOSIS — M62838 Other muscle spasm: Secondary | ICD-10-CM

## 2021-07-26 DIAGNOSIS — F17200 Nicotine dependence, unspecified, uncomplicated: Secondary | ICD-10-CM

## 2021-07-26 DIAGNOSIS — G8929 Other chronic pain: Secondary | ICD-10-CM

## 2021-07-26 LAB — ECHOCARDIOGRAM COMPLETE
AR max vel: 2.57 cm2
AV Area VTI: 2.26 cm2
AV Area mean vel: 2.33 cm2
AV Mean grad: 3 mmHg
AV Peak grad: 6.6 mmHg
Ao pk vel: 1.28 m/s
Area-P 1/2: 3.54 cm2
Height: 69 in
S' Lateral: 2.9 cm
Weight: 3111.13 oz

## 2021-07-26 LAB — BASIC METABOLIC PANEL
Anion gap: 10 (ref 5–15)
Anion gap: 7 (ref 5–15)
BUN: 19 mg/dL (ref 6–20)
BUN: 23 mg/dL — ABNORMAL HIGH (ref 6–20)
CO2: 27 mmol/L (ref 22–32)
CO2: 26 mmol/L (ref 22–32)
Calcium: 9.5 mg/dL (ref 8.9–10.3)
Calcium: 9.1 mg/dL (ref 8.9–10.3)
Chloride: 105 mmol/L (ref 98–111)
Chloride: 106 mmol/L (ref 98–111)
Creatinine, Ser: 1.38 mg/dL — ABNORMAL HIGH (ref 0.61–1.24)
Creatinine, Ser: 1.3 mg/dL — ABNORMAL HIGH (ref 0.61–1.24)
GFR, Estimated: 60 mL/min — ABNORMAL LOW (ref >60)
GFR, Estimated: >60 mL/min (ref >60)
Glucose, Bld: 103 mg/dL — ABNORMAL HIGH (ref 70–99)
Glucose, Bld: 107 mg/dL — ABNORMAL HIGH (ref 70–99)
Potassium: 3.7 mmol/L (ref 3.5–5.1)
Potassium: 4 mmol/L (ref 3.5–5.1)
Sodium: 142 mmol/L (ref 135–145)
Sodium: 139 mmol/L (ref 135–145)

## 2021-07-26 LAB — TSH: TSH: 41.179 u[IU]/mL — ABNORMAL HIGH (ref 0.350–4.500)

## 2021-07-26 LAB — T4, FREE: Free T4: <0.25 ng/dL — ABNORMAL LOW (ref 0.61–1.12)

## 2021-07-26 LAB — HIV ANTIBODY (ROUTINE TESTING W REFLEX): HIV Screen 4th Generation wRfx: NONREACTIVE

## 2021-07-26 MED ORDER — ENOXAPARIN SODIUM 40 MG/0.4ML IJ SOSY
40.0000 mg | PREFILLED_SYRINGE | INTRAMUSCULAR | Status: DC
  Administered 2021-07-26 – 2021-09-11 (×48): 40 mg via SUBCUTANEOUS
  Filled 2021-07-26 (×48): qty 0.4

## 2021-07-26 MED ORDER — LACTATED RINGERS IV BOLUS
500.0000 mL | Freq: Once | INTRAVENOUS | Status: AC
  Administered 2021-07-26: 500 mL via INTRAVENOUS

## 2021-07-26 MED ORDER — LEVOTHYROXINE SODIUM 75 MCG PO TABS
75.0000 ug | ORAL_TABLET | Freq: Every day | ORAL | Status: DC
  Administered 2021-07-27: 75 ug via ORAL
  Filled 2021-07-26: qty 1

## 2021-07-26 NOTE — Progress Notes (Addendum)
STROKE TEAM PROGRESS NOTE   SUBJECTIVE (INTERVAL HISTORY) He is seen alone at the bedside.  Overall his condition is unchanged. Patient reports that he is visiting from [facility], where he had a stroke 3 months ago (Seen at [facility]). He reports acute onset numbness and tingling of his L face 2-3 days ago which progressed to dysarthria and L sided weakness. This morning, he reports no improvements in his sx. CTH with no acute ischemic or hemorrhagic changes but age-indeterminate lacunar infarcts and chronic small vessel ischemic changes. MRI with acute infarcts of R frontal lobe, bilateral corona radiata, L temporal lobe/hippocampus, and L callosal genu. 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   OBJECTIVE CBC:  Recent Labs  Lab 07/25/21 1159 07/25/21 1205  WBC 7.3  --   NEUTROABS 5.4  --   HGB 14.2 15.0  HCT 44.2 44.0  MCV 97.6  --   PLT 219  --    Basic Metabolic Panel:  Recent Labs  Lab 07/25/21 1159 07/25/21 1205 07/26/21 0039  NA 140 139 142  K 3.8 3.7 3.7  CL 103 105 105  CO2 25  --  27  GLUCOSE 138* 141* 103*  BUN 20 24* 19  CREATININE 1.39* 1.20 1.38*  CALCIUM 9.3  --  9.5   Lipid Panel:  Recent Labs  Lab 07/25/21 1159  CHOL 320*  TRIG 132  HDL 50  CHOLHDL 6.4  VLDL 26  LDLCALC 340*   HgbA1c:  Recent Labs  Lab 07/25/21 1159  HGBA1C 5.9*   Urine Drug Screen:  Recent Labs  Lab 07/25/21 1456  LABOPIA NONE DETECTED  COCAINSCRNUR NONE DETECTED  LABBENZ NONE DETECTED  AMPHETMU POSITIVE*  THCU NONE DETECTED  LABBARB NONE DETECTED    Alcohol Level No results for input(s): ETH in the last 168 hours.  IMAGING past 24 hours MR ANGIO HEAD WO CONTRAST  Result Date: 07/25/2021 CLINICAL DATA:  Neuro deficit, acute, stroke suspected. EXAM: MRI HEAD WITHOUT CONTRAST MRA HEAD WITHOUT CONTRAST MRA NECK WITHOUT CONTRAST TECHNIQUE: Multiplanar, multi-echo pulse sequences of the brain and surrounding structures were acquired without  intravenous contrast. Angiographic images of the Circle of Willis were acquired using MRA technique without intravenous contrast. Angiographic images of the neck were acquired using MRA technique without intravenous contrast. Carotid stenosis measurements (when applicable) are obtained utilizing NASCET criteria, using the distal internal carotid diameter as the denominator. COMPARISON:  Noncontrast head CT performed earlier today 07/25/2021. FINDINGS: MRI HEAD FINDINGS Brain: Cerebral volume is normal. There is a small acute infarct within the right aspect of the callosal genu. There is a patchy acute infarct within the right corona radiata/basal ganglia, measuring up to 2.7 x 1.4 cm in transaxial dimensions. There is a 10 mm acute infarct within the left corona radiata. There is a punctate acute infarct within the high posterior right frontal lobe. There is a punctate acute infarct within the anteromedial left temporal lobe/hippocampus. Small chronic cortical/subcortical infarct within the mid left frontal lobe. Background moderate multifocal T2 FLAIR hyperintense signal abnormality within the cerebral white matter, nonspecific but compatible with chronic small vessel ischemic disease. This includes chronic lacunar infarcts within the bilateral cerebral hemispheric white matter. Chronic lacunar infarcts within the bilateral deep gray nuclei and right pons. A small chronic infarct is questioned within the inferior right cerebellar hemisphere. No evidence of an intracranial mass. No chronic intracranial blood products. No extra-axial fluid collection. No midline shift. Vascular: Reported below. Skull  and upper cervical spine: No focal suspicious marrow lesion. Sinuses/Orbits: Visualized orbits show no acute finding. Mild scattered paranasal sinus mucosal thickening. Small volume fluid within the left sphenoid sinus. MRA HEAD FINDINGS Anterior circulation: The intracranial internal carotid arteries are patent. The M1  middle cerebral arteries are patent. No M2 proximal branch occlusion or high-grade proximal stenosis is identified. The anterior cerebral arteries are patent. The right A1 segment is developmentally absent. No intracranial aneurysm is identified. Posterior circulation: The intracranial vertebral arteries are patent. The basilar artery is patent with mild atherosclerotic irregularity. The posterior cerebral arteries are patent. Apparent severe stenosis within the right PCA at the P2 segment level. Apparent moderate/severe stenosis within the left PCA P2/P3 junction. Anatomic variants: As described. MRA NECK FINDINGS Common origin of the innominate left common carotid arteries. The common carotid and internal carotid arteries are patent within the neck without hemodynamically significant stenosis (50% or greater). No more than mild atherosclerotic irregularity about the carotid bifurcations, bilaterally. Motion degradation and noncontrast technique limits evaluation of the origins of the vertebral arteries. Within this limitation, the vertebral arteries are patent within the neck without appreciable significant stenosis. IMPRESSION: MRI brain: 1. Acute infarcts within the high right frontal lobe, right corona radiata/basal ganglia, left corona radiata, anteromedial left temporal lobe/hippocampus and left aspect of the callosal genu. Involvement of multiple vascular territories raises the possibility of an embolic process. 2. Background generalized parenchymal atrophy, chronic ischemic disease and multiple chronic infarcts, as detailed. MRA head: 1. No intracranial large vessel occlusion is identified. 2. Intracranial atherosclerotic disease with multifocal stenoses, most notably as follows. 3. Apparent severe stenosis within the P2 right PCA. 4. Apparent moderate/severe stenosis within the left PCA at the P2/P3 junction. MRA neck: 1. The common carotid and internal carotid arteries are patent within the neck without  hemodynamically significant stenosis. No more than mild atherosclerotic irregularity about the carotid bifurcations. 2. Motion degradation and non-contrast technique limits evaluation of the origins of the vertebral arteries. Within this limitation, the vertebral arteries are patent within the neck without appreciable significant stenosis. Electronically Signed   By: Jackey Loge D.O.   On: 07/25/2021 16:52   MR ANGIO NECK WO CONTRAST  Result Date: 07/25/2021 CLINICAL DATA:  Neuro deficit, acute, stroke suspected. EXAM: MRI HEAD WITHOUT CONTRAST MRA HEAD WITHOUT CONTRAST MRA NECK WITHOUT CONTRAST TECHNIQUE: Multiplanar, multi-echo pulse sequences of the brain and surrounding structures were acquired without intravenous contrast. Angiographic images of the Circle of Willis were acquired using MRA technique without intravenous contrast. Angiographic images of the neck were acquired using MRA technique without intravenous contrast. Carotid stenosis measurements (when applicable) are obtained utilizing NASCET criteria, using the distal internal carotid diameter as the denominator. COMPARISON:  Noncontrast head CT performed earlier today 07/25/2021. FINDINGS: MRI HEAD FINDINGS Brain: Cerebral volume is normal. There is a small acute infarct within the right aspect of the callosal genu. There is a patchy acute infarct within the right corona radiata/basal ganglia, measuring up to 2.7 x 1.4 cm in transaxial dimensions. There is a 10 mm acute infarct within the left corona radiata. There is a punctate acute infarct within the high posterior right frontal lobe. There is a punctate acute infarct within the anteromedial left temporal lobe/hippocampus. Small chronic cortical/subcortical infarct within the mid left frontal lobe. Background moderate multifocal T2 FLAIR hyperintense signal abnormality within the cerebral white matter, nonspecific but compatible with chronic small vessel ischemic disease. This includes chronic  lacunar infarcts within the bilateral cerebral hemispheric white  matter. Chronic lacunar infarcts within the bilateral deep gray nuclei and right pons. A small chronic infarct is questioned within the inferior right cerebellar hemisphere. No evidence of an intracranial mass. No chronic intracranial blood products. No extra-axial fluid collection. No midline shift. Vascular: Reported below. Skull and upper cervical spine: No focal suspicious marrow lesion. Sinuses/Orbits: Visualized orbits show no acute finding. Mild scattered paranasal sinus mucosal thickening. Small volume fluid within the left sphenoid sinus. MRA HEAD FINDINGS Anterior circulation: The intracranial internal carotid arteries are patent. The M1 middle cerebral arteries are patent. No M2 proximal branch occlusion or high-grade proximal stenosis is identified. The anterior cerebral arteries are patent. The right A1 segment is developmentally absent. No intracranial aneurysm is identified. Posterior circulation: The intracranial vertebral arteries are patent. The basilar artery is patent with mild atherosclerotic irregularity. The posterior cerebral arteries are patent. Apparent severe stenosis within the right PCA at the P2 segment level. Apparent moderate/severe stenosis within the left PCA P2/P3 junction. Anatomic variants: As described. MRA NECK FINDINGS Common origin of the innominate left common carotid arteries. The common carotid and internal carotid arteries are patent within the neck without hemodynamically significant stenosis (50% or greater). No more than mild atherosclerotic irregularity about the carotid bifurcations, bilaterally. Motion degradation and noncontrast technique limits evaluation of the origins of the vertebral arteries. Within this limitation, the vertebral arteries are patent within the neck without appreciable significant stenosis. IMPRESSION: MRI brain: 1. Acute infarcts within the high right frontal lobe, right corona  radiata/basal ganglia, left corona radiata, anteromedial left temporal lobe/hippocampus and left aspect of the callosal genu. Involvement of multiple vascular territories raises the possibility of an embolic process. 2. Background generalized parenchymal atrophy, chronic ischemic disease and multiple chronic infarcts, as detailed. MRA head: 1. No intracranial large vessel occlusion is identified. 2. Intracranial atherosclerotic disease with multifocal stenoses, most notably as follows. 3. Apparent severe stenosis within the P2 right PCA. 4. Apparent moderate/severe stenosis within the left PCA at the P2/P3 junction. MRA neck: 1. The common carotid and internal carotid arteries are patent within the neck without hemodynamically significant stenosis. No more than mild atherosclerotic irregularity about the carotid bifurcations. 2. Motion degradation and non-contrast technique limits evaluation of the origins of the vertebral arteries. Within this limitation, the vertebral arteries are patent within the neck without appreciable significant stenosis. Electronically Signed   By: Jackey LogeKyle  Golden D.O.   On: 07/25/2021 16:52   MR BRAIN WO CONTRAST  Result Date: 07/25/2021 CLINICAL DATA:  Neuro deficit, acute, stroke suspected. EXAM: MRI HEAD WITHOUT CONTRAST MRA HEAD WITHOUT CONTRAST MRA NECK WITHOUT CONTRAST TECHNIQUE: Multiplanar, multi-echo pulse sequences of the brain and surrounding structures were acquired without intravenous contrast. Angiographic images of the Circle of Willis were acquired using MRA technique without intravenous contrast. Angiographic images of the neck were acquired using MRA technique without intravenous contrast. Carotid stenosis measurements (when applicable) are obtained utilizing NASCET criteria, using the distal internal carotid diameter as the denominator. COMPARISON:  Noncontrast head CT performed earlier today 07/25/2021. FINDINGS: MRI HEAD FINDINGS Brain: Cerebral volume is normal. There  is a small acute infarct within the right aspect of the callosal genu. There is a patchy acute infarct within the right corona radiata/basal ganglia, measuring up to 2.7 x 1.4 cm in transaxial dimensions. There is a 10 mm acute infarct within the left corona radiata. There is a punctate acute infarct within the high posterior right frontal lobe. There is a punctate acute infarct within the anteromedial left  temporal lobe/hippocampus. Small chronic cortical/subcortical infarct within the mid left frontal lobe. Background moderate multifocal T2 FLAIR hyperintense signal abnormality within the cerebral white matter, nonspecific but compatible with chronic small vessel ischemic disease. This includes chronic lacunar infarcts within the bilateral cerebral hemispheric white matter. Chronic lacunar infarcts within the bilateral deep gray nuclei and right pons. A small chronic infarct is questioned within the inferior right cerebellar hemisphere. No evidence of an intracranial mass. No chronic intracranial blood products. No extra-axial fluid collection. No midline shift. Vascular: Reported below. Skull and upper cervical spine: No focal suspicious marrow lesion. Sinuses/Orbits: Visualized orbits show no acute finding. Mild scattered paranasal sinus mucosal thickening. Small volume fluid within the left sphenoid sinus. MRA HEAD FINDINGS Anterior circulation: The intracranial internal carotid arteries are patent. The M1 middle cerebral arteries are patent. No M2 proximal branch occlusion or high-grade proximal stenosis is identified. The anterior cerebral arteries are patent. The right A1 segment is developmentally absent. No intracranial aneurysm is identified. Posterior circulation: The intracranial vertebral arteries are patent. The basilar artery is patent with mild atherosclerotic irregularity. The posterior cerebral arteries are patent. Apparent severe stenosis within the right PCA at the P2 segment level. Apparent  moderate/severe stenosis within the left PCA P2/P3 junction. Anatomic variants: As described. MRA NECK FINDINGS Common origin of the innominate left common carotid arteries. The common carotid and internal carotid arteries are patent within the neck without hemodynamically significant stenosis (50% or greater). No more than mild atherosclerotic irregularity about the carotid bifurcations, bilaterally. Motion degradation and noncontrast technique limits evaluation of the origins of the vertebral arteries. Within this limitation, the vertebral arteries are patent within the neck without appreciable significant stenosis. IMPRESSION: MRI brain: 1. Acute infarcts within the high right frontal lobe, right corona radiata/basal ganglia, left corona radiata, anteromedial left temporal lobe/hippocampus and left aspect of the callosal genu. Involvement of multiple vascular territories raises the possibility of an embolic process. 2. Background generalized parenchymal atrophy, chronic ischemic disease and multiple chronic infarcts, as detailed. MRA head: 1. No intracranial large vessel occlusion is identified. 2. Intracranial atherosclerotic disease with multifocal stenoses, most notably as follows. 3. Apparent severe stenosis within the P2 right PCA. 4. Apparent moderate/severe stenosis within the left PCA at the P2/P3 junction. MRA neck: 1. The common carotid and internal carotid arteries are patent within the neck without hemodynamically significant stenosis. No more than mild atherosclerotic irregularity about the carotid bifurcations. 2. Motion degradation and non-contrast technique limits evaluation of the origins of the vertebral arteries. Within this limitation, the vertebral arteries are patent within the neck without appreciable significant stenosis. Electronically Signed   By: Jackey Loge D.O.   On: 07/25/2021 16:52   CT HEAD CODE STROKE WO CONTRAST  Result Date: 07/25/2021 CLINICAL DATA:  Code stroke. Provided  history: Neuro deficit, acute, stroke suspected. EXAM: CT HEAD WITHOUT CONTRAST TECHNIQUE: Contiguous axial images were obtained from the base of the skull through the vertex without intravenous contrast. RADIATION DOSE REDUCTION: This exam was performed according to the departmental dose-optimization program which includes automated exposure control, adjustment of the mA and/or kV according to patient size and/or use of iterative reconstruction technique. COMPARISON:  No pertinent prior exams available for comparison. FINDINGS: Brain: Cerebral volume is normal for age. Patchy and ill-defined hypoattenuation within the cerebral white matter, nonspecific but compatible with chronic small vessel ischemic disease. Small age-indeterminate lacunar infarcts within the bilateral basal ganglia, right thalamus and right pons. There is no acute intracranial hemorrhage. No demarcated  cortical infarct. No extra-axial fluid collection. No evidence of an intracranial mass. No midline shift. Vascular: No hyperdense vessel.  Atherosclerotic calcifications. Skull: Normal. Negative for fracture or focal lesion. Sinuses/Orbits: Visualized orbits show no acute finding. Small volume frothy secretions within a posterior right ethmoid air cell. Trace thickening elsewhere within the bilateral ethmoid sinuses. Small volume frothy secretions within the left sphenoid sinus. ASPECTS ([place] Stroke Program Early CT Score) - Ganglionic level infarction (caudate, lentiform nuclei, internal capsule, insula, M1-M3 cortex): 6 - Supraganglionic infarction (M4-M6 cortex): 3 Total score (0-10 with 10 being normal): 9 These results were communicated to Dr. Thomasena Edisollins At 12:14 pmon 2/4/2023by text page via the [facility] messaging system. IMPRESSION: No acute intracranial hemorrhage or acute demarcated cortical infarction. Age-indeterminate lacunar infarcts within the bilateral basal ganglia, right thalamus and right pons. Chronic small vessel ischemic changes  within the cerebral white matter. Electronically Signed   By: Jackey LogeKyle  Golden D.O.   On: 07/25/2021 12:14     PHYSICAL EXAM Temp:  [97.8 F (36.6 C)-98.8 F (37.1 C)] 98.2 F (36.8 C) (02/05 0430) Pulse Rate:  [45-70] 54 (02/05 0430) Resp:  [9-20] 16 (02/05 0430) BP: (115-194)/(75-129) 166/83 (02/05 0430) SpO2:  [88 %-100 %] 99 % (02/05 0430) Weight:  [88.2 kg-89.3 kg] 88.2 kg (02/04 1832)  General - Well nourished, well developed, in no apparent distress.  Cardiovascular - Regular rhythm and rate.  Mental Status -  Level of arousal and orientation to time, place, and person were intact. Language including expression, naming, repetition, comprehension was assessed and found with dysarthria and increased latency of responses. Attention span and concentration were normal. Fund of Knowledge was assessed and was intact.  Cranial Nerves II - XII - II - Visual field intact OU. III, IV, VI - Extraocular movements intact with bilateral ptosis, R>L V - Facial sensation intact bilaterally. VII - Facial movement with L facial droop. But able to raise eyebrows symmetrically VIII - Hearing & vestibular intact bilaterally. XII - Tongue protrusion intact.  Motor Strength - The patients strength was 4/5 in RUE and RLE and pronator drift was absent. Strength was absent in LUE and appeared 3/5 in LLE (but patient reported those were muscle spasms and that he could not move spontaneously).  Bulk was normal and fasciculations were absent. Patient did not withdraw LUE to pain, but withdrew LLE. Motor Tone - Muscle tone was assessed at the neck and appendages and was normal except flaccid LUE.  Sensory - Light touch, pain were assessed and were symmetrical bilaterally.    Coordination - The patient had normal movements in the RUE and RLE with no ataxia or dysmetria.  Tremor was absent.  Gait and Station - deferred.    ASSESSMENT/Figueroa Jason Figueroa is a 56 y.o. male with history of prior  stroke, recent TIA, and HTN presenting as a Code Stroke with L facial numbness and tingling. Progressing to dysarthria and LUE and LLE weakness.  Stroke:  multifocal infarct, embolic pattern, secondary unclear source CT head no acute ischemic or hemorrhagic changes but age-indeterminate lacunar infarcts and chronic small vessel ischemic changes.  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 Bilateral LE Doppler: No evidence of DVT 2D Echo LVEF 60-65%, mild LVH, tricuspid aortic valve TEE pending; awaiting scheduling. Consider 30 day monitoring vs. Loop recorder if TEE unrevealing LDL 244 HgbA1c  5.9 VTE prophylaxis - SCDs No antithrombotic prior to admission, now on aspirin 81 mg daily and clopidogrel 75 mg daily.  Therapy recommendations:  Pending PT/OT  Disposition:   Pending PT/OT   Hypertension Home meds:  Lisinopril Stable Permissive hypertension (OK if < 220/110) but gradually normalize in 5-7 days Long-term BP goal normotensive  Hyperlipidemia Home meds:  N/A LDL 244, goal < 70 Continue Atorvastatin 80 mg  Continue statin at discharge  Tobacco abuse Current smoker Smoking cessation counseling provided Pt is willing to quit  Hx of substance abuse Pt admitted hx of cocaine use but not recently Admitted using THC intermittently Admitted using meth intermittently Takes adderall prescribed by his doctor in [facility] UDS positive for Amphetamines  Patient educated to stop using due to stroke risk.  Other Stroke Risk Factors Hx stroke/TIA - last year with syncope, was treated in [facility] Suspected OSA: Per primary team - "Placed on 2L O2 by RN due to de-saturation 84-88% at times during apnea overnight. Oxygen saturation remained 92% or higher with 2L of supplemental oxygen."- outpt sleep study recommended  Other Active Problems   Hospital  day # 0   Jason Sprinkles, MD Stroke Neurology- Neuro Psych Resident 07/26/2021 7:59 AM  ATTENDING NOTE: I reviewed above note and agree with the assessment and Figueroa. Pt was seen and examined.   55 year old male with history of hypertension, stroke in [facility] (no details), smoker, substance abuse admitted for left facial droop, left arm and leg weakness.  BP was high on presentation.  CT no acute abnormality but old lacunar infarct at the bilateral BG, right thalamus, right pontine.  MRI showed multifocal infarcts including right CR/BG, right ACA, left CR, left hippocampal region.  MRA right P2 and left P2/P3 stenosis.  DVT negative.  EF 60 to 65%.  LDL 244, A1c 5.9.  UDS positive for amphetamine.  Creatinine 1.20.  On exam, patient drowsy sleepy, but orientated to place, time and age.  No aphasia but moderate dysarthria, follows all simple commands, able to name and repeat.  Visual field testing not corporative but no clear hemianopsia.  No gaze palsy, left facial droop.  Tongue midline.  Left upper extremity flaccid, left lower extremity increased muscle tone, 2+/5 on pain stimulation.  Right upper and lower extremity 5/5.  Sensation subjectively symmetrical.  Right finger-to-nose intact.  Etiology for patient's stroke not quite clear, stroke pattern concerning for embolic, however patient does have multiple uncontrolled risk factors including hypertension, hyperlipidemia, smoker and substance abuse.  Now on aspirin 81 and Plavix 75 DAPT.  Lipitor 80.  Recommend TEE to rule out cardiac embolic source.  May consider 30-day cardiac event monitoring or loop recorder if TEE unrevealing.  PT/OT pending, aggressive risk factor modification.  Smoking and substance cessation education provided.  Will follow.  For detailed assessment and Figueroa, please refer to above as I have made changes wherever appropriate.   Jason Plan, MD PhD Stroke Neurology 07/26/2021 2:12 PM    To contact Stroke Continuity  provider, please refer to [email or web address]. After hours, contact General Neurology

## 2021-07-26 NOTE — Progress Notes (Signed)
Patient voided 450 mls of urine

## 2021-07-26 NOTE — Progress Notes (Signed)
VASCULAR LAB    Bilateral lower extremity venous duplex has been performed.  See CV proc for preliminary results.   Zohal Reny, RVT 07/26/2021, 8:28 AM

## 2021-07-26 NOTE — Progress Notes (Signed)
° °  Subjective Patient was seen at bedside during rounds today. He is sleepy but awakes to voice. He has noticed no improvement in weakness. He denies any confusion.   Pt is updated on the plan for today, and all questions and concerns are addressed.   Objective:  Vital signs in last 24 hours: Vitals:   07/26/21 0427 07/26/21 0430 07/26/21 0826 07/26/21 0900  BP: (!) 166/83 (!) 166/83 119/78   Pulse: (!) 54 (!) 54 (!) 51   Resp: 12 16 10    Temp:  98.2 F (36.8 C) 98.3 F (36.8 C)   TempSrc:  Axillary Axillary   SpO2: 99% 99% 100% 98%  Weight:      Height:       Constitutional: alert, well-appearing, in NAD  Cardiovascular: RRR, no m/r/g, non-edematous bilateral LE Pulmonary/Chest: normal work of breathing on room air, LCTAB Abdominal: soft, non-tender to palpation, non-distended MSK: normal bulk and tone Skin: warm and dry Neurological: A&O x 3, follows commands Left facial droop, tongue deviates slightly the left, slurred speech. Unable to move left upper and lower extremities and 5/5 in upper and lower right extremities. Good hand grip bilaterally R>L. Sensation intact.  Assessment/Plan:  Principal Problem:   Left-sided weakness Active Problems:   Stroke [facility])   Multifocal ischemic brain infarcts, embolic pattern; unclear source  TTE largely unremarkable (no bubble study done), TEE tmrw. Exam largely unchanged from admission. Normotensive BP, will hold off on antihypertensives at this time.  - Neurology following, appreciated assistance - Aspirin 81mg  daily - Clopidogrel 75mg  daily for 3 weeks - Atorvastatin 80 mg  - Allow for permissive HTN first 24 hrs - Tele monitoring  - PT recommending CIR; screened by CIR, is good candidate  - OT - Outpatient sleep study recommended   Uncontrolled hypothyroidism TSH 41, with Free T4 <0.25. Thyroid [facility] not concerning. Bradycardic, but remains HDS.  - Started synthroid 75 mcg  - Recheck TSH in 4-6 weeks  Urinary retention   Renal neg. Retained 325cc this AM, but not agreeable to in and out. Has condom cath, and produced >1L yesterday. Denies abdominal pain.   Chronic pain  Muscle spasm  - Continue gabapentin 300mg  tid  - Continue Flexeril 5mg  prn    Best Practice: Diet: NPO IVF: None,None VTE: Levenox 40 Code: Full   , MD  Internal Medicine Resident, PGY-1 Pager: [phone] After 5pm on weekdays and 1pm on weekends: On Call pager [phone]

## 2021-07-26 NOTE — Progress Notes (Signed)
°  Echocardiogram 2D Echocardiogram has been performed.  Jason Figueroa F 07/26/2021, 8:46 AM

## 2021-07-26 NOTE — TOC CAGE-AID Note (Addendum)
Transition of Care [facility]) - CAGE-AID Screening   Patient Details  Name: Jason Figueroa MRN: [id number] Date of Birth: 1965-12-14  Transition of Care [facility]) CM/SW Contact:    Ralene Bathe, LCSWA Phone Number: 07/26/2021, 11:58 AM   Clinical Narrative: CSW unable to complete CAGE aid as patient would only  wake for a few seconds and then fall back asleep.     CAGE-AID Screening: Substance Abuse Screening unable to be completed due to: : Patient unable to participate

## 2021-07-26 NOTE — Progress Notes (Signed)
PT Cancellation Note  Patient Details Name: Jason Figueroa MRN: [id number] DOB: March 07, 1966   Cancelled Treatment:    Reason Eval/Treat Not Completed: Fatigue/lethargy limiting ability to participate. Pt asleep upon arrival. Attempted to wake pt with him opening eyes and responding briefly, stating "you picked a wrong time" and requested PT come back tomorrow instead. Will plan to follow-up tomorrow as able.   Raymond Gurney, PT, DPT Acute Rehabilitation Services  Pager: [phone] Office: [phone]    Jewel Baize 07/26/2021, 4:26 PM

## 2021-07-26 NOTE — Progress Notes (Signed)
Patient and nurse called daughter and provided an update on her father. Daughter's questions addressed and all concerns as well. Father able to update mother and daughter while nurse present.

## 2021-07-26 NOTE — Progress Notes (Signed)
During sleep patient HR will decrease from 40-60. Patient has sleep apnea, so 2 L oxygen on to prevent desaturation. Patient denies pain. Patient alert and oriented. Patient able to be aroused. Patient denies needing anything at the moment.    07/26/21 2028  Assess: MEWS Score  BP (!) 164/91  Pulse Rate (!) 48  ECG Heart Rate (!) 49  Resp 10  SpO2 97 %  O2 Device Nasal Cannula  Patient Activity (if Appropriate) In bed  O2 Flow Rate (L/min) 2 L/min  Assess: MEWS Score  MEWS Temp 0  MEWS Systolic 0  MEWS Pulse 1  MEWS RR 1  MEWS LOC 0  MEWS Score 2  MEWS Score Color Yellow  Assess: if the MEWS score is Yellow or Red  Were vital signs taken at a resting state? Yes  Focused Assessment No change from prior assessment  Early Detection of Sepsis Score *See Row Information* Low  MEWS guidelines implemented *See Row Information* No, previously yellow, continue vital signs every 4 hours  Treat  MEWS Interventions Administered scheduled meds/treatments;Administered prn meds/treatments  Pain Scale 0-10  Pain Score 0  Take Vital Signs  Increase Vital Sign Frequency   (Yellow mews before, VS every 4 hours)  Escalate  MEWS: Escalate  (Yellow mews before, VS every 4 hours)  Document  Patient Outcome Other (Comment) (Stable no s/s of distress or complaints of any kind)  Progress note created (see row info) Yes

## 2021-07-26 NOTE — Progress Notes (Signed)
Patient has not urinated today. Bladder scan showed 433 mls of urine. Patient not experiencing any discomfort, but unable to urinate despite trying to. RN paged MD. Patient stated that he will not allow [place] to straight cath him.

## 2021-07-26 NOTE — Progress Notes (Signed)
Due to desaturation while sleeping and apnea, patient was placed on 2L of oxygen as his saturation was 84-88% at times during apnea. Patient showed no distress and slept soundly through the night. Oxygen saturation remained 92% or higher with 2L of supplemental oxygen.

## 2021-07-26 NOTE — Progress Notes (Signed)
Patient has slept soundly during the night as he was given Gabapentin prior to bed. Patient has not urinated and was bladder scanned this morning, containing . Patient in and out of sleep and refusing to be catheterized but is not trying to urinate naturally. Patient coached and advised to try his best to urinate. Will continue to monitor.

## 2021-07-27 DIAGNOSIS — E782 Mixed hyperlipidemia: Secondary | ICD-10-CM

## 2021-07-27 LAB — BASIC METABOLIC PANEL
Anion gap: 9 (ref 5–15)
BUN: 23 mg/dL — ABNORMAL HIGH (ref 6–20)
CO2: 27 mmol/L (ref 22–32)
Calcium: 9.4 mg/dL (ref 8.9–10.3)
Chloride: 103 mmol/L (ref 98–111)
Creatinine, Ser: 1.39 mg/dL — ABNORMAL HIGH (ref 0.61–1.24)
GFR, Estimated: 59 mL/min — ABNORMAL LOW (ref >60)
Glucose, Bld: 97 mg/dL (ref 70–99)
Potassium: 3.9 mmol/L (ref 3.5–5.1)
Sodium: 139 mmol/L (ref 135–145)

## 2021-07-27 LAB — CORTISOL-AM, BLOOD: Cortisol - AM: 9.7 ug/dL (ref 6.7–22.6)

## 2021-07-27 MED ORDER — LEVOTHYROXINE SODIUM 100 MCG/5ML IV SOLN
50.0000 ug | Freq: Every day | INTRAVENOUS | Status: DC
  Administered 2021-07-28: 50 ug via INTRAVENOUS
  Filled 2021-07-27 (×2): qty 5

## 2021-07-27 MED ORDER — HYDROCORTISONE SOD SUC (PF) 100 MG IJ SOLR
100.0000 mg | Freq: Three times a day (TID) | INTRAMUSCULAR | Status: DC
  Administered 2021-07-27 – 2021-07-28 (×3): 100 mg via INTRAVENOUS
  Filled 2021-07-27 (×3): qty 2

## 2021-07-27 MED ORDER — LEVOTHYROXINE SODIUM 100 MCG/5ML IV SOLN
300.0000 ug | Freq: Once | INTRAVENOUS | Status: AC
Start: 2021-07-27 — End: 2021-07-27
  Administered 2021-07-27: 300 ug via INTRAVENOUS
  Filled 2021-07-27: qty 15

## 2021-07-27 NOTE — Progress Notes (Signed)
Patient HR is 40-50s while asleep, rhythm is SB. Patient has sleep apnea as well, so he has periods of desaturation, but is wearing 2L of oxygen supplemental. Patient is not in distress and does not report needing assistance of any kind. Will continue to monitor.   07/27/21 0228  Assess: MEWS Score  BP (!) 159/71  Pulse Rate (!) 44  ECG Heart Rate (!) 44  Resp 12  SpO2 99 %  O2 Device Nasal Cannula  O2 Flow Rate (L/min) 2 L/min  Assess: MEWS Score  MEWS Temp 0  MEWS Systolic 0  MEWS Pulse 1  MEWS RR 1  MEWS LOC 0  MEWS Score 2  MEWS Score Color Yellow  Assess: if the MEWS score is Yellow or Red  Were vital signs taken at a resting state? Yes  Focused Assessment No change from prior assessment  Early Detection of Sepsis Score *See Row Information* Low  MEWS guidelines implemented *See Row Information* No, previously yellow, continue vital signs every 4 hours  Treat  MEWS Interventions Other (Comment) (assessed patient. no s/s of distress. SB and apnea when asleep)  Take Vital Signs  Increase Vital Sign Frequency   (VS every 4 hours as patient has had yellow mews in the past)  Escalate  MEWS: Escalate  (VS every 4 hours as patient has had yellow mews in the past)  Document  Patient Outcome Other (Comment) (stable, no s/s of disress or need for assistance noted)  Progress note created (see row info) Yes

## 2021-07-27 NOTE — Progress Notes (Signed)
No distress noted. VS every 4 hours. Sb and apnea during sleep  07/27/21 0400  Assess: if the MEWS score is Yellow or Red  Were vital signs taken at a resting state? Yes  Focused Assessment No change from prior assessment  Early Detection of Sepsis Score *See Row Information* Low  MEWS guidelines implemented *See Row Information* No, previously yellow, continue vital signs every 4 hours  Treat  MEWS Interventions Other (Comment) (assessed patient. no s/s of distress. SB and apnea when asleep)  Take Vital Signs  Increase Vital Sign Frequency   (assessed patient. no s/s of distress. SB and apnea when asleep. VS q4hr)  Escalate  MEWS: Escalate  (assessed patient. no s/s of distress. SB and apnea when asleep)  Document  Patient Outcome Other (Comment) (VS every 4, stable no s/s of distress)  Progress note created (see row info) Yes

## 2021-07-27 NOTE — Progress Notes (Addendum)
STROKE TEAM PROGRESS NOTE   SUBJECTIVE (INTERVAL HISTORY) He is seen alone at the bedside.  Overall his condition is unchanged. Patient clarifies that he is now living in [facility], no longer in [place]. This morning, he reports no improvements in his sx.   CTH with no acute ischemic or hemorrhagic changes but age-indeterminate lacunar infarcts and chronic small vessel ischemic changes. MRI with acute infarcts of R frontal lobe, bilateral corona radiata, L temporal lobe/hippocampus, and L callosal genu. 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   OBJECTIVE CBC:  Recent Labs  Lab 07/25/21 1159 07/25/21 1205  WBC 7.3  --   NEUTROABS 5.4  --   HGB 14.2 15.0  HCT 44.2 44.0  MCV 97.6  --   PLT 219  --     Basic Metabolic Panel:  Recent Labs  Lab 07/26/21 1141 07/27/21 0135  NA 139 139  K 4.0 3.9  CL 106 103  CO2 26 27  GLUCOSE 107* 97  BUN 23* 23*  CREATININE 1.30* 1.39*  CALCIUM 9.1 9.4    Lipid Panel:  Recent Labs  Lab 07/25/21 1159  CHOL 320*  TRIG 132  HDL 50  CHOLHDL 6.4  VLDL 26  LDLCALC 945*    HgbA1c:  Recent Labs  Lab 07/25/21 1159  HGBA1C 5.9*    Urine Drug Screen:  Recent Labs  Lab 07/25/21 1456  LABOPIA NONE DETECTED  COCAINSCRNUR NONE DETECTED  LABBENZ NONE DETECTED  AMPHETMU POSITIVE*  THCU NONE DETECTED  LABBARB NONE DETECTED     Alcohol Level No results for input(s): ETH in the last 168 hours.  IMAGING past 24 hours US RENAL  Result Date: 07/26/2021 CLINICAL DATA:  Acute kidney injury EXAM: RENAL / URINARY TRACT ULTRASOUND COMPLETE COMPARISON:  None. FINDINGS: Right Kidney: Renal measurements: 10.8 x 5.1 x 5.3 cm = volume: 150.94 mL. Echogenicity within normal limits. No mass or hydronephrosis visualized. Left Kidney: Renal measurements: 11.6 x 5.2 x 5.9 cm = volume: 185.03 mL. Echogenicity within normal limits. No mass or hydronephrosis visualized. Bladder: Appears normal for degree of bladder  distention. Other: None. IMPRESSION: 1. Normal renal ultrasound. Electronically Signed   By: Elige Ko M.D.   On: 07/26/2021 14:04   US THYROID  Result Date: 07/26/2021 CLINICAL DATA:  Hypothyroid. History of previous radioactive thyroid ablation. EXAM: THYROID ULTRASOUND TECHNIQUE: Ultrasound examination of the thyroid gland and adjacent soft tissues was performed. COMPARISON:  None. FINDINGS: Parenchymal Echotexture: Normal - no evidence of glandular hyperemia. Isthmus: No significant isthmic parenchyma is identified Right lobe: Atrophic measuring 3.0 x 0.9 x 0.8 cm Left lobe: Atrophic measuring 1.5 x 0.6 x 0.4 cm _________________________________________________________ Estimated total number of nodules >/= 1 cm: 0 Number of spongiform nodules >/=  2 cm not described below (TR1): 0 Number of mixed cystic and solid nodules >/= 1.5 cm not described below (TR2): 0 _________________________________________________________ No discrete nodules are seen within the thyroid gland. No regional cervical lymphadenopathy. IMPRESSION: Expectedly atrophic thyroid parenchyma following previous radioactive thyroid ablation without discrete thyroid nodule or mass. Electronically Signed   By: Simonne Come M.D.   On: 07/26/2021 14:15     PHYSICAL EXAM Temp:  [97.9 F (36.6 C)-98.2 F (36.8 C)] 97.9 F (36.6 C) (02/06 1123) Pulse Rate:  [44-73] 61 (02/06 1123) Resp:  [10-20] 20 (02/06 1123) BP: (121-186)/(71-91) 186/86 (02/06 1123) SpO2:  [92 %-99 %] 97 % (02/06 1123)  General - Well nourished, well  developed, in no apparent distress.  Cardiovascular - Regular rhythm and rate.  Mental Status -  Level of arousal and orientation to time, place, and person were intact. Language including expression, naming, repetition, comprehension was assessed and again found with dysarthria but less instances of increased latency of responses. Attention span and concentration were normal. Fund of Knowledge was assessed and  was intact.  Cranial Nerves II - XII - II - Visual field intact OU. III, IV, VI - Extraocular movements intact with bilateral ptosis, R>L V - Facial sensation intact bilaterally. VII - Facial movement with L facial droop. But able to raise eyebrows symmetrically VIII - Hearing & vestibular intact bilaterally. XII - Tongue protrusion intact.  Motor Strength - The patients strength was 4/5 in RUE and RLE and pronator drift was absent. Strength was 1-/5 LUE (Able to slightly move hand on bed) and appeared 2/5 in LLE (but patient reported those were muscle spasms and that he could not move spontaneously).  Bulk was normal and fasciculations were absent. Motor Tone - Muscle tone was assessed at the neck and appendages and was normal except flaccid LUE.  Sensory - Light touch was assessed and symmetrical bilaterally.    Coordination - The patient had normal movements in the RUE and RLE with no ataxia or dysmetria.  Tremor was absent.  Gait and Station - deferred.    ASSESSMENT/PLAN [name] is a 56 y.o. male with history of prior stroke, recent TIA, and HTN presenting as a Code Stroke with L facial numbness and tingling. Progressing to dysarthria and LUE and LLE weakness. Patient with minimal improvements today.   Stroke:  multifocal infarct, embolic pattern, secondary unclear source CT head no acute ischemic or hemorrhagic changes but age-indeterminate lacunar infarcts and chronic small vessel ischemic changes.  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 Bilateral LE Doppler: No evidence of DVT 2D Echo LVEF 60-65%, mild LVH, tricuspid aortic valve TEE pending; scheduled for tomorrow, 2/7. Patient to be NPO at midnight. Consider Loop recorder if TEE unrevealing LDL 244 HgbA1c 5.9 VTE prophylaxis - SCDs No antithrombotic prior to  admission, now on aspirin 81 mg daily and clopidogrel 75 mg daily for 3 weeks and then ASA alone.  Therapy recommendations:  CIR Disposition:   Pending CIR consultation  Hypertension Home meds:  Lisinopril Stable Gradually normalize BP in 2-3 days Long-term BP goal normotensive  Hyperlipidemia Home meds:  N/A LDL 244, goal < 70 Continue Atorvastatin 80 mg  Continue statin at discharge  Tobacco abuse Current smoker Smoking cessation counseling provided Pt is willing to quit  Hx of substance abuse Pt admitted hx of remote cocaine use but not recently Admitted using THC intermittently Takes adderall prescribed by his doctor in [facility] for concentration, UDS positive for Amphetamines   Other Stroke Risk Factors Hx stroke/TIA - last year with syncope, was treated in [facility] Suspected OSA: Per primary team - "Placed on 2L O2 by RN due to de-saturation 84-88% at times during apnea overnight. Oxygen saturation remained 92% or higher with 2L of supplemental oxygen."- outpt sleep study recommended  Other Active Problems   Hospital day # 1   Jason Sprinkles, MD Stroke Neurology- Neuro Psych Resident 07/27/2021 1:02 PM  ATTENDING NOTE: I reviewed above note and agree with the assessment and plan. Pt was seen and examined.  No family at bedside.  PT is working with him and he as well as left hemiplegia, PT recommend CIR.  Patient overnight no acute event, neuro stable, still has left facial droop, dysarthria and left hemiplegia and increased left lower extremity muscle tone.  Planning for TEE tomorrow and if unrevealing, may consider loop recorder placement.  Continue aspirin Plavix and Lipitor.  Aggressive risk factor modification and PT/OT therapy.  We will follow.  For detailed assessment and plan, please refer to above as I have made changes wherever appropriate.   Marvel Plan, MD PhD Stroke Neurology 07/27/2021 3:51 PM    To contact Stroke Continuity provider, please refer to  [email or web address]. After hours, contact General Neurology

## 2021-07-27 NOTE — Progress Notes (Signed)
° ° °  [facility] has been requested to perform a transesophageal echocardiogram on Jason Figueroa for CVA.  After careful review of history and examination, the risks and benefits of transesophageal echocardiogram have been explained including risks of esophageal damage, perforation (1:10,000 risk), bleeding, pharyngeal hematoma as well as other potential complications associated with conscious sedation including aspiration, arrhythmia, respiratory failure and death. Alternatives to treatment were discussed, questions were answered. Patient had altered mental status and was unable to fully participate in the conversation. Patient did state "do whatever you have to do." Discussed procedure, risks with mother, family at bedside who agreed to the procedure. Signed consent form in patient's paper chart at nursing station.   Jonita Albee, PA-C 07/27/2021 12:50 PM

## 2021-07-27 NOTE — Evaluation (Signed)
Physical Therapy Evaluation Patient Details Name: Jason Figueroa MRN: [id number] DOB: 06-18-1966 Today's Date: 07/27/2021  History of Present Illness  56 y.o. male presents to [facility] on 07/25/2021 with numbness and tingling on left side of face along with L sided weakness. 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 PMH inlcudes HTN, previous stroke,  and recent TIA.  Clinical Impression  Pt presents to PT with deficits in strength, power, endurance, tone, balance, functional mobility, and gait. Pt with profound L side weakness, flaccid LUE and minimal LLE activation noted at this time. Pt often losing balance to left side at this time in sitting. Pt will benefit from acute PT services to reduce falls risk and improve L sided strength.       Recommendations for follow up therapy are one component of a multi-disciplinary discharge planning process, led by the attending physician.  Recommendations may be updated based on patient status, additional functional criteria and insurance authorization.  Follow Up Recommendations Acute inpatient rehab (3hours/day)    Assistance Recommended at Discharge Frequent or constant Supervision/Assistance  Patient can return home with the following  A lot of help with walking and/or transfers;A lot of help with bathing/dressing/bathroom;Assistance with cooking/housework;Assistance with feeding;Direct supervision/assist for medications management;Direct supervision/assist for financial management;Assist for transportation;Help with stairs or ramp for entrance    Equipment Recommendations Wheelchair (measurements PT);Wheelchair cushion (measurements PT);Hospital bed  Recommendations for Other Services  Rehab consult    Functional Status Assessment Patient has had a recent decline in their functional status and demonstrates the ability to make significant improvements in  function in a reasonable and predictable amount of time.     Precautions / Restrictions Precautions Precautions: Fall Precaution Comments: L hemiparesis Restrictions Weight Bearing Restrictions: No      Mobility  Bed Mobility Overal bed mobility: Needs Assistance Bed Mobility: Supine to Sit     Supine to sit: Mod assist     General bed mobility comments: assist with LLE, hand hold to aide in pulling trunk into sitting    Transfers Overall transfer level: Needs assistance Equipment used: 1 person hand held assist Transfers: Sit to/from Stand, Bed to chair/wheelchair/BSC Sit to Stand: Mod assist Stand pivot transfers: Mod assist         General transfer comment: PT provides BUE support and L knee block, L knee buckling noted during pivot transfer    Ambulation/Gait Ambulation/Gait assistance:  (deferred 2/2 high falls risk)                Stairs            Wheelchair Mobility    Modified Rankin (Stroke Patients Only)       Balance Overall balance assessment: Needs assistance Sitting-balance support: Single extremity supported, Feet supported Sitting balance-[name] Scale: Poor Sitting balance - Comments: min-modA with UE support of railing Postural control: Left lateral lean                                   Pertinent Vitals/Pain Pain Assessment Pain Assessment: No/denies pain    Home Living Family/patient expects to be discharged to:: Private residence Living Arrangements: Parent Available Help at Discharge: Family;Available PRN/intermittently Type of Home: Mobile home Home Access: Ramped entrance       Home Layout: One level Home Equipment: Agricultural consultant (2 wheels)      Prior  Function Prior Level of Function : Independent/Modified Independent             Mobility Comments: pt reports ambulating without use of a device, recently moved from [facility]       Hand Dominance         Extremity/Trunk Assessment   Upper Extremity Assessment Upper Extremity Assessment: LUE deficits/detail LUE Deficits / Details: PROM WFL, flaccid    Lower Extremity Assessment Lower Extremity Assessment: LLE deficits/detail LLE Deficits / Details: PROM WFL, some hip flexion activation noted, pt with flexor spasticity in LLE. profound weakness       Communication   Communication: Other (comment) (dysarthria)  Cognition Arousal/Alertness: Awake/alert Behavior During Therapy: WFL for tasks assessed/performed Overall Cognitive Status: Within Functional Limits for tasks assessed                                          General Comments General comments (skin integrity, edema, etc.): VSS on RA    Exercises     Assessment/Plan    PT Assessment Patient needs continued PT services  PT Problem List Decreased strength;Decreased activity tolerance;Decreased balance;Decreased mobility;Decreased coordination;Impaired tone       PT Treatment Interventions DME instruction;Gait training;Functional mobility training;Therapeutic activities;Therapeutic exercise;Balance training;Neuromuscular re-education;Patient/family education    PT Goals (Current goals can be found in the Care Plan section)  Acute Rehab PT Goals Patient Stated Goal: to regain strength and independence PT Goal Formulation: With patient Time For Goal Achievement: 08/10/21 Potential to Achieve Goals: Good    Frequency Min 4X/week     Co-evaluation               AM-PAC PT "6 Clicks" Mobility  Outcome Measure Help needed turning from your back to your side while in a flat bed without using bedrails?: A Lot Help needed moving from lying on your back to sitting on the side of a flat bed without using bedrails?: A Lot Help needed moving to and from a bed to a chair (including a wheelchair)?: A Lot Help needed standing up from a chair using your arms (e.g., wheelchair or bedside chair)?: A  Lot Help needed to walk in hospital room?: Total Help needed climbing 3-5 steps with a railing? : Total 6 Click Score: 10    End of Session   Activity Tolerance: Patient tolerated treatment well Patient left: in chair;with call bell/phone within reach;with chair alarm set Nurse Communication: Mobility status;Need for lift equipment (STEDY with +2 assist) PT Visit Diagnosis: Other abnormalities of gait and mobility (R26.89);Muscle weakness (generalized) (M62.81);Other symptoms and signs involving the nervous system (U88.916)    Time: 9450-3888 PT Time Calculation (min) (ACUTE ONLY): 24 min   Charges:   PT Evaluation $PT Eval Moderate Complexity: 1 Mod          Arlyss Gandy, PT, DPT Acute Rehabilitation Pager: [phone] Office [phone]   Arlyss Gandy 07/27/2021, 9:12 AM

## 2021-07-27 NOTE — Progress Notes (Addendum)
Patient has slept soundly during the night as he was given Gabapentin prior to bed. Patient has not urinated and was bladder scanned this morning, containing . Patient alert and states "give me a chance" when nurse in room coaching him to urinate and the importance of urinating. Patient continued to drift off to sleep and refusing to be catheterized and states "that doctor is not giving me a catheter". Patient coached and advised to try his best to urinate. Will continue to monitor.

## 2021-07-27 NOTE — Evaluation (Signed)
Occupational Therapy Evaluation Patient Details Name: Jason Figueroa MRN: [id number] DOB: August 11, 1965 Today's Date: 07/27/2021   History of Present Illness 56 y.o. male presents to [facility] hospital on 07/25/2021 with numbness and tingling on left side of face along with L sided weakness. 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 PMH inlcudes HTN, previous stroke,  and recent TIA.   Clinical Impression    [name] was evaluated s/p the above CVA, he recently moved from [facility] to [place] to live with is mother and assist her as needed. They live in a 1 level home, ramped entrance. Upon evaluation pt required max A for transfers and up to max A +2 for ADLs. Pt limited by lethargy, L extremity weakness/flaccidity and poor balance. He will benefit from OT acutely to address the deficits listed below. Recommend intensive therapies at the CIR level.       Recommendations for follow up therapy are one component of a multi-disciplinary discharge planning process, led by the attending physician.  Recommendations may be updated based on patient status, additional functional criteria and insurance authorization.   Follow Up Recommendations  Acute inpatient rehab (3hours/day)    Assistance Recommended at Discharge Frequent or constant Supervision/Assistance  Patient can return home with the following A lot of help with walking and/or transfers;A lot of help with bathing/dressing/bathroom;Assistance with cooking/housework;Direct supervision/assist for medications management;Assist for transportation;Help with stairs or ramp for entrance    Functional Status Assessment  Patient has had a recent decline in their functional status and demonstrates the ability to make significant improvements in function in a reasonable and predictable amount of time.  Equipment Recommendations  BSC/3in1;Wheelchair cushion (measurements OT);Wheelchair  (measurements OT);Tub/shower bench    Recommendations for Other Services Rehab consult     Precautions / Restrictions Precautions Precautions: Fall Precaution Comments: L hemiparesis Restrictions Weight Bearing Restrictions: No      Mobility Bed Mobility Overal bed mobility: Needs Assistance Bed Mobility: Sit to Supine       Sit to supine: Mod assist   General bed mobility comments: assist with LLE, verbal cues for positioning    Transfers Overall transfer level: Needs assistance Equipment used: 1 person hand held assist Transfers: Sit to/from Stand, Bed to chair/wheelchair/BSC Sit to Stand: Max assist Stand pivot transfers: Max assist         General transfer comment: Pt fatigued and lethargic this session and required incrased assist. verbal cues for R sided positioning, to power into standing, block LLE and manage L extremeties.      Balance Overall balance assessment: Needs assistance Sitting-balance support: Single extremity supported, Feet supported Sitting balance-[name] Scale: Poor Sitting balance - Comments: min-modA with UE support of railing Postural control: Left lateral lean                                 ADL either performed or assessed with clinical judgement   ADL Overall ADL's : Needs assistance/impaired Eating/Feeding: NPO   Grooming: Moderate assistance;Sitting   Upper Body Bathing: Moderate assistance;Sitting   Lower Body Bathing: Maximal assistance;+2 for physical assistance;+2 for safety/equipment;Sit to/from stand   Upper Body Dressing : Moderate assistance;Sitting   Lower Body Dressing: +2 for physical assistance;+2 for safety/equipment;Sit to/from stand   Toilet Transfer: Maximal assistance;+2 for physical assistance;+2 for safety/equipment;Stand-pivot;BSC/3in1   Toileting- Clothing Manipulation and Hygiene: Maximal assistance;+2 for physical assistance;+2 for safety/equipment;Sit to/from  stand       Functional  mobility during ADLs: Maximal assistance General ADL Comments: L hemi, lethargy, poor sitting balance     Vision Baseline Vision/History: 1 Wears glasses Ability to See in Adequate Light: 0 Adequate Patient Visual Report: No change from baseline Vision Assessment?: No apparent visual deficits Additional Comments: limited assessment due to lethargy     Perception     Praxis      Pertinent Vitals/Pain Pain Assessment Pain Assessment: No/denies pain     Hand Dominance Right   Extremity/Trunk Assessment Upper Extremity Assessment Upper Extremity Assessment: LUE deficits/detail LUE Deficits / Details: PROM WFL, flaccid - small trace movement at hand. Able to adduct extremety slightly when supine LUE Sensation: WNL;decreased proprioception LUE Coordination: decreased fine motor;decreased gross motor   Lower Extremity Assessment Lower Extremity Assessment: Defer to PT evaluation LLE Deficits / Details: PROM WFL, some hip flexion activation noted, pt with flexor spasticity in LLE. profound weakness   Cervical / Trunk Assessment Cervical / Trunk Assessment: Normal   Communication Communication Communication: Expressive difficulties (slurred speech)   Cognition Arousal/Alertness: Lethargic Behavior During Therapy: WFL for tasks assessed/performed Overall Cognitive Status: Within Functional Limits for tasks assessed                                 General Comments: follows commands with increased time. lethargic throughout with eyes closed 50% of the time.     General Comments  VSS on RA    Exercises     Shoulder Instructions      Home Living Family/patient expects to be discharged to:: Private residence Living Arrangements: Parent Available Help at Discharge: Family;Available PRN/intermittently Type of Home: Mobile home Home Access: Ramped entrance     Home Layout: One level     Bathroom Shower/Tub: Chief Strategy Officer: Standard      Home Equipment: Agricultural consultant (2 wheels)   Additional Comments: pt moved back to [place] to assist his mother, she unable to assist him      Prior Functioning/Environment Prior Level of Function : Independent/Modified Independent             Mobility Comments: pt reports ambulating without use of a device, recently moved from [facility] ADLs Comments: indep, drives, does not work        OT Problem List: Decreased range of motion;Decreased strength;Decreased activity tolerance;Impaired balance (sitting and/or standing);Decreased cognition;Decreased safety awareness;Decreased knowledge of use of DME or AE;Decreased knowledge of precautions;Impaired UE functional use      OT Treatment/Interventions: Self-care/ADL training;Therapeutic exercise;Balance training;Patient/family education;Therapeutic activities;DME and/or AE instruction    OT Goals(Current goals can be found in the care plan section) Acute Rehab OT Goals Patient Stated Goal: to eat OT Goal Formulation: With patient Time For Goal Achievement: 08/10/21 Potential to Achieve Goals: Good ADL Goals Pt Will Perform Grooming: with set-up;sitting Pt Will Perform Upper Body Dressing: with set-up;sitting Pt Will Perform Lower Body Dressing: with min guard assist;sit to/from stand Pt Will Transfer to Toilet: with min guard assist;stand pivot transfer;bedside commode Additional ADL Goal #1: pt will independently complete bed mobility as a precursor to ADLs  OT Frequency: Min 2X/week    Co-evaluation              AM-PAC OT "6 Clicks" Daily Activity     Outcome Measure Help from another person eating meals?: Total Help from another person taking care of  personal grooming?: A Little Help from another person toileting, which includes using toliet, bedpan, or urinal?: A Lot Help from another person bathing (including washing, rinsing, drying)?: A Lot Help from another person to put on and taking off regular upper  body clothing?: A Lot Help from another person to put on and taking off regular lower body clothing?: A Lot 6 Click Score: 12   End of Session Equipment Utilized During Treatment: Gait belt Nurse Communication: Mobility status  Activity Tolerance: Patient limited by lethargy Patient left: in bed;with bed alarm set;with call bell/phone within reach  OT Visit Diagnosis: Unsteadiness on feet (R26.81);Other abnormalities of gait and mobility (R26.89);Muscle weakness (generalized) (M62.81);Pain;Hemiplegia and hemiparesis;Feeding difficulties (R63.3) Hemiplegia - Right/Left: Left Hemiplegia - dominant/non-dominant: Non-Dominant Hemiplegia - caused by: Cerebral infarction                Time: 9379-0240 OT Time Calculation (min): 23 min Charges:  OT General Charges $OT Visit: 1 Visit OT Evaluation $OT Eval Moderate Complexity: 1 Mod OT Treatments $Therapeutic Activity: 8-22 mins   Areeba Sulser A Shailynn Fong 07/27/2021, 12:35 PM

## 2021-07-27 NOTE — Progress Notes (Signed)
° °  Subjective Patient was seen at bedside after TEE procedure. He is sleepy but awakes to voice. He has noticed no improvement in weakness. He denies any confusion.   Pt is updated on the plan for today, and all questions and concerns are addressed.   Objective:  Vital signs in last 24 hours: Vitals:   07/27/21 0337 07/27/21 0400 07/27/21 0737 07/27/21 1123  BP: 121/81  (!) 141/91 (!) 186/86  Pulse: (!) 51 (!) 45 73 61  Resp: 10 11 15 20   Temp: 98.2 F (36.8 C)  98.2 F (36.8 C) 97.9 F (36.6 C)  TempSrc: Axillary  Oral Axillary  SpO2: 99% 92% 98% 97%  Weight:      Height:       Constitutional: alert, well-appearing, in NAD  Cardiovascular: RRR, no m/r/g, non-edematous bilateral LE Pulmonary/Chest: normal work of breathing on room air, LCTAB Abdominal: soft, non-tender to palpation, non-distended MSK: normal bulk and tone Skin: warm and dry Neurological: A&O x 3, follows commands. Ongoing left facial droop, dysarthria and left hemiplegia, with some improved L leg weakness.   Assessment/Plan:  Principal Problem:   Left-sided weakness Active Problems:   Stroke [facility])   Multifocal ischemic brain infarcts, embolic pattern; unclear source  TEE revealing large PFO with R to L shunting; no LA/LAA thrombus or masses. ROPE score 4, not ideal candidate for closure. Plans for pt to f/u with [facility] as an OP. Plan to 30 day loop recording. Some improvement in left LE strength today. BP elevated today, will start home Lisinopril 40.   - Neurology following, appreciated assistance - Not candidate for PFO closure; OP follow up w/ Dr Excell Seltzer.  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 Improved lethargy and bradycardia with IV synthroid. AM cortisol elevated, will D/C IV steroids.  - Continue IV synthroid 50 mcg daily  - Discontinue IV steroids  - Follow up free T4/T3 tmrw  - Recheck TSH in 4-6 weeks  Urinary retention  Renal Excell Seltzer neg. Continues to produce urine.   Chronic pain  Muscle spasm  - Continue gabapentin 300mg  tid  - Spasm note controlled with Flexeril 5mg  prn, will change to Baclofen    Best Practice: Diet: NPO IVF: None,None VTE: Levenox 40 Code: Full   , MD  Internal Medicine Resident, PGY-1 Pager: [phone] After 5pm on weekdays and 1pm on weekends: On Call pager [phone]

## 2021-07-27 NOTE — Progress Notes (Signed)
°  Transition of Care [facility]) Screening Note   Patient Details  Name: Jason Figueroa Date of Birth: Jan 12, 1966   Transition of Care [facility]) CM/SW Contact:    Kermit Balo, RN Phone Number: 07/27/2021, 2:11 PM    Transition of Care Department [facility]) has reviewed patient. We will continue to monitor patient advancement through interdisciplinary progression rounds. If new patient transition needs arise, please place a TOC consult.

## 2021-07-27 NOTE — Progress Notes (Signed)
Inpatient Rehab Admissions Coordinator:   Attempted to meet with patient at the bedside to discuss CIR.  He requested I return later this afternoon since he was eating.    Estill Dooms, PT, DPT Admissions Coordinator [phone] 07/27/21  11:55 AM

## 2021-07-27 NOTE — Progress Notes (Signed)
Inpatient Rehab Admissions Coordinator:  ? ?Per therapy recommendations,  patient was screened for CIR candidacy by John Vasconcelos, MS, CCC-SLP. At this time, Pt. Appears to be a a potential candidate for CIR. I will place   order for rehab consult per protocol for full assessment. Please contact me any with questions. ? ?Braydon Kullman, MS, CCC-SLP ?Rehab Admissions Coordinator  ?[phone] (celll) ?[phone] (office) ? ?

## 2021-07-28 ENCOUNTER — Encounter (HOSPITAL_COMMUNITY)
Admission: EM | Disposition: A | Discharge status: Skilled Nursing Facility | Payer: Self-pay | Source: Home / Self Care | Attending: Internal Medicine

## 2021-07-28 ENCOUNTER — Inpatient Hospital Stay (HOSPITAL_COMMUNITY): Payer: Medicaid Other | Admitting: Certified Registered"

## 2021-07-28 ENCOUNTER — Encounter (HOSPITAL_COMMUNITY): Payer: Self-pay | Admitting: Internal Medicine

## 2021-07-28 ENCOUNTER — Inpatient Hospital Stay (HOSPITAL_COMMUNITY): Payer: Medicaid Other

## 2021-07-28 DIAGNOSIS — Q2112 Patent foramen ovale: Secondary | ICD-10-CM

## 2021-07-28 DIAGNOSIS — I639 Cerebral infarction, unspecified: Secondary | ICD-10-CM

## 2021-07-28 DIAGNOSIS — E039 Hypothyroidism, unspecified: Secondary | ICD-10-CM

## 2021-07-28 HISTORY — PX: TEE WITHOUT CARDIOVERSION: SHX5443

## 2021-07-28 HISTORY — PX: BUBBLE STUDY: SHX6837

## 2021-07-28 LAB — BASIC METABOLIC PANEL
Anion gap: 9 (ref 5–15)
BUN: 27 mg/dL — ABNORMAL HIGH (ref 6–20)
CO2: 27 mmol/L (ref 22–32)
Calcium: 9.5 mg/dL (ref 8.9–10.3)
Chloride: 103 mmol/L (ref 98–111)
Creatinine, Ser: 1.31 mg/dL — ABNORMAL HIGH (ref 0.61–1.24)
GFR, Estimated: >60 mL/min (ref >60)
Glucose, Bld: 141 mg/dL — ABNORMAL HIGH (ref 70–99)
Potassium: 3.8 mmol/L (ref 3.5–5.1)
Sodium: 139 mmol/L (ref 135–145)

## 2021-07-28 LAB — CORTISOL-AM, BLOOD: Cortisol - AM: 78.7 ug/dL — ABNORMAL HIGH (ref 6.7–22.6)

## 2021-07-28 LAB — THYROID PEROXIDASE ANTIBODY: Thyroperoxidase Ab SerPl-aCnc: 13 IU/mL (ref 0–34)

## 2021-07-28 SURGERY — ECHOCARDIOGRAM, TRANSESOPHAGEAL
Anesthesia: Monitor Anesthesia Care

## 2021-07-28 MED ORDER — SODIUM CHLORIDE 0.9 % IV SOLN: INTRAVENOUS | Status: DC

## 2021-07-28 MED ORDER — LISINOPRIL 20 MG PO TABS
20.0000 mg | ORAL_TABLET | Freq: Every day | ORAL | Status: DC
  Administered 2021-07-28: 20 mg via ORAL
  Filled 2021-07-28: qty 1

## 2021-07-28 MED ORDER — PROPOFOL 500 MG/50ML IV EMUL
INTRAVENOUS | Status: DC | PRN
  Administered 2021-07-28: 125 ug/kg/min via INTRAVENOUS

## 2021-07-28 MED ORDER — BACLOFEN 10 MG PO TABS
20.0000 mg | ORAL_TABLET | Freq: Three times a day (TID) | ORAL | Status: DC | PRN
  Administered 2021-07-28 – 2021-07-30 (×4): 20 mg via ORAL
  Filled 2021-07-28 (×4): qty 2

## 2021-07-28 MED ORDER — LISINOPRIL 20 MG PO TABS
20.0000 mg | ORAL_TABLET | Freq: Once | ORAL | Status: AC
  Administered 2021-07-28: 20 mg via ORAL
  Filled 2021-07-28: qty 1

## 2021-07-28 MED ORDER — LISINOPRIL 20 MG PO TABS
40.0000 mg | ORAL_TABLET | Freq: Every day | ORAL | Status: DC
  Administered 2021-07-29 – 2021-09-11 (×45): 40 mg via ORAL
  Filled 2021-07-28 (×46): qty 2

## 2021-07-28 NOTE — Progress Notes (Addendum)
STROKE TEAM PROGRESS NOTE   SUBJECTIVE (INTERVAL HISTORY) He is seen alone at the bedside.  Overall his condition is unchanged. NAEON. This morning, he reports no improvements nor worsening of his sx. Still with L facial droop, L hemiplegia.   CTH with no acute ischemic or hemorrhagic changes but age-indeterminate lacunar infarcts and chronic small vessel ischemic changes. MRI with acute infarcts of R frontal lobe, bilateral corona radiata, L temporal lobe/hippocampus, and L callosal genu. 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   OBJECTIVE CBC:  Recent Labs  Lab 07/25/21 1159 07/25/21 1205  WBC 7.3  --   NEUTROABS 5.4  --   HGB 14.2 15.0  HCT 44.2 44.0  MCV 97.6  --   PLT 219  --     Basic Metabolic Panel:  Recent Labs  Lab 07/27/21 0135 07/28/21 0158  NA 139 139  K 3.9 3.8  CL 103 103  CO2 27 27  GLUCOSE 97 141*  BUN 23* 27*  CREATININE 1.39* 1.31*  CALCIUM 9.4 9.5    Lipid Panel:  Recent Labs  Lab 07/25/21 1159  CHOL 320*  TRIG 132  HDL 50  CHOLHDL 6.4  VLDL 26  LDLCALC 244*    HgbA1c:  Recent Labs  Lab 07/25/21 1159  HGBA1C 5.9*    Urine Drug Screen:  Recent Labs  Lab 07/25/21 1456  LABOPIA NONE DETECTED  COCAINSCRNUR NONE DETECTED  LABBENZ NONE DETECTED  AMPHETMU POSITIVE*  THCU NONE DETECTED  LABBARB NONE DETECTED     Alcohol Level No results for input(s): ETH in the last 168 hours.  IMAGING past 24 hours No results found.   PHYSICAL EXAM Temp:  [97.7 F (36.5 C)-98.4 F (36.9 C)] 98.4 F (36.9 C) (02/07 1143) Pulse Rate:  [52-76] 65 (02/07 1203) Resp:  [10-20] 15 (02/07 1203) BP: (133-208)/(76-123) 183/80 (02/07 1203) SpO2:  [95 %-100 %] 96 % (02/07 1203) FiO2 (%):  [21 %] 21 % (02/07 0700) Weight:  [88.2 kg] 88.2 kg (02/07 1052)  General - Well nourished, well developed, male, in no apparent distress.  Cardiovascular - Regular rhythm and rate.  Mental Status -  Level of arousal and  orientation to time, place, and person were intact. Language including expression, naming, repetition, comprehension was assessed and again found with mild improvements to dysarthria Attention span and concentration were normal. Fund of Knowledge was assessed and was intact.  Cranial Nerves II - XII - II - Visual field intact OU. III, IV, VI - Extraocular movements intact with bilateral ptosis, R>L VII - Facial movement with L facial droop. VIII - Hearing & vestibular intact bilaterally. XII - Tongue protrusion intact.  Motor Strength - The patients strength was 4/5 in RUE and RLE and pronator drift was absent. Strength was 0/5 LUE and appeared 1/5 in LLE (patient barely moves).  Bulk was normal and fasciculations were absent. Motor Tone - Muscle tone was assessed at the neck and appendages and was normal except flaccid LUE.  Coordination - The patient had normal movements in the RUE and RLE with no ataxia or dysmetria.  Tremor was absent.  Gait and Station - deferred.    ASSESSMENT/PLAN [name] is a 56 y.o. male with history of prior stroke, recent TIA, and HTN presenting as a Code Stroke with L facial numbness and tingling. Progressing to dysarthria and LUE and LLE weakness. Patient with minimal improvements today.   Stroke:  multifocal infarct,  embolic pattern, secondary unclear source CT head no acute ischemic or hemorrhagic changes but age-indeterminate lacunar infarcts and chronic small vessel ischemic changes.  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 Bilateral LE Doppler: No evidence of DVT 2D Echo LVEF 60-65%, mild LVH, tricuspid aortic valve TEE Large PFO w/ R to L shunting Recommend 30-day cardiac event monitoring as outpatient to rule out A-fib LDL 244 HgbA1c 5.9 VTE prophylaxis - Lovenox No antithrombotic prior  to admission, now on aspirin 81 mg daily and clopidogrel 75 mg daily for 3 weeks and then ASA alone.  Therapy recommendations:  CIR Disposition:   CIR  PFO TEE showed large PFO with right-to-left shunting ROPE score 4, patient not ideal candidate for PFOclosure. will follow-up outpatient with Dr. Burt Knack  Hypertension Home meds:  Lisinopril Stable Gradually normalize BP in 2-3 days Long-term BP goal normotensive  Hyperlipidemia Home meds:  N/A LDL 244, goal < 70 Continue Atorvastatin 80 mg  Continue statin at discharge  Tobacco abuse Current smoker Smoking cessation counseling provided Pt is willing to quit  Hx of substance abuse Pt admitted hx of remote cocaine use but not recently Admitted using THC intermittently Takes adderall prescribed by his doctor in [facility] for concentration, UDS positive for Amphetamines   Other Stroke Risk Factors Hx stroke/TIA - last year with syncope, was treated in [facility] Suspected OSA: Per primary team - "Placed on 2L O2 by RN due to de-saturation 84-88% at times during apnea overnight. Oxygen saturation remained 92% or higher with 2L of supplemental oxygen."- outpt sleep study recommended  Other [facility] day # 2   Rosezetta Schlatter, MD Stroke Neurology- Neuro Psych Resident 07/28/2021 12:14 PM  ATTENDING NOTE: I reviewed above note and agree with the assessment and plan.    No acute event overnight, no significant neuro changes, still has left facial droop and left hemiplegia.  TEE showed large PFO, no LV thrombus or vegetation.  Given patient risk factors and history of stroke, ROPE score = 4, not a good candidate for PFO closure.  However will refer to Dr. Burt Knack to follow-up as outpatient.  Recommend 30-day CardioNet monitoring as outpatient to rule out A-fib.  PT/OT recommended CIR.  Continue aspirin and Plavix 75 DAPT for 3 weeks and then aspirin alone.  Continue Lipitor 80.  Referred to outpatient sleep study and quit  smoking.  Aggressive risk factor modification.  For detailed assessment and plan, please refer to above as I have made changes wherever appropriate.   Neurology will sign off. Please call with questions. Pt will follow up with stroke clinic Dr. Leonie Man at [facility] in about 4 weeks. Thanks for the consult.   Rosalin Hawking, MD PhD Stroke Neurology 07/28/2021 6:32 PM   To contact Stroke Continuity provider, please refer to [email or web address] After hours, contact General Neurology

## 2021-07-28 NOTE — Progress Notes (Addendum)
° °  Subjective ON: SBP elevated 180. Amlodipine 5 mg given with better control of pressure.   Patient was seen at bedside during rounds. He is sitting in bed enjoying his breakfast. He has noticed minimal improvement in weakness.   Pt is updated on the plan for today, and all questions and concerns are addressed.   Objective:  Vital signs in last 24 hours: Vitals:   07/28/21 1143 07/28/21 1153 07/28/21 1203 07/28/21 1233  BP: (!) 168/80 (!) 176/91 (!) 183/80 (!) 186/79  Pulse: 76 75 65 61  Resp: 11 11 15 20   Temp: 98.4 F (36.9 C)   (!) 97.4 F (36.3 C)  TempSrc: Temporal   Oral  SpO2: 100% 100% 96% 98%  Weight:      Height:       Constitutional: alert, well-appearing, in NAD  Cardiovascular: RRR, no m/r/g, non-edematous bilateral LE Pulmonary/Chest: normal work of breathing on room air, LCTAB Abdominal: soft, non-tender to palpation, non-distended MSK: normal bulk and tone Skin: warm and dry Neurological: A&O x 3, follows commands. Ongoing left facial droop, dysarthria and left hemiplegia, with some improved L leg weakness.   Assessment/Plan:  Principal Problem:   Left-sided weakness Active Problems:   Stroke [facility])   Multifocal embolic ischemic brain infarcts 2/2 large PFO  ROPE score 4, not ideal candidate for closure; pan to f/u with [facility] as an OP. 30 day cardiac event monitoring at D/C. Some improvement in left LE strength today. BP elevated last night with SBP 180, will add amlodipine.  - Neurology following, appreciated assistance - Not candidate for PFO closure; OP follow up w/ Dr Excell Seltzer.  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 Free T4 improved to 0.41 2 days post IV Synthroid. Improved lethargy and bradycardia. Will transition to PO synthroid starting tmrw.  - Continue IV synthroid 50 mcg today  - PO synthroid 100 mcg starting tmrw  - Recheck TSH and free T4 in 4-6 weeks  Urinary retention  Renal Excell Seltzer neg. Continues to produce >1L of urine.   Chronic pain  Muscle spasm  - Continue gabapentin 300mg  tid  - Continue Baclofen    Best Practice: Diet: NPO IVF: None,None VTE: Levenox 40 Code: Full   , MD  Internal Medicine Resident, PGY-1 Pager: [phone] After 5pm on weekdays and 1pm on weekends: On Call pager [phone]

## 2021-07-28 NOTE — Anesthesia Preprocedure Evaluation (Addendum)
Anesthesia Evaluation  Patient identified by MRN, date of birth, ID band Patient awake    Reviewed: Allergy & Precautions, NPO status , Patient's Chart, lab work & pertinent test results  History of Anesthesia Complications Negative for: history of anesthetic complications  Airway Mallampati: III  TM Distance: >3 FB Neck ROM: Full    Dental  (+) Poor Dentition, Dental Advisory Given   Pulmonary Current Smoker and Patient abstained from smoking.,    Pulmonary exam normal        Cardiovascular hypertension, Pt. on medications  Rhythm:Regular Rate:Normal  Noncompliant  IMPRESSIONS    1. Left ventricular ejection fraction, by estimation, is 60 to 65%. The  left ventricle has normal function. The left ventricle has no regional  wall motion abnormalities. There is mild left ventricular hypertrophy.  Left ventricular diastolic parameters  were normal.  2. Right ventricular systolic function is normal. The right ventricular  size is normal.  3. The mitral valve is normal in structure. No evidence of mitral valve  regurgitation. No evidence of mitral stenosis.  4. The aortic valve is tricuspid. Aortic valve regurgitation is not  visualized. No aortic stenosis is present.  5. The inferior vena cava is normal in size with greater than 50%  respiratory variability, suggesting right atrial pressure of 3 mmHg.    Neuro/Psych TIACVA    GI/Hepatic negative GI ROS, Neg liver ROS,   Endo/Other  negative endocrine ROS  Renal/GU negative Renal ROS     Musculoskeletal negative musculoskeletal ROS (+)   Abdominal   Peds  Hematology negative hematology ROS (+)   Anesthesia Other Findings   Reproductive/Obstetrics                            Anesthesia Physical Anesthesia Plan  ASA: 3  Anesthesia Plan: MAC   Post-op Pain Management:    Induction:   PONV Risk Score and Plan: Propofol  infusion  Airway Management Planned: Natural Airway  Additional Equipment:   Intra-op Plan:   Post-operative Plan:   Informed Consent: I have reviewed the patients History and Physical, chart, labs and discussed the procedure including the risks, benefits and alternatives for the proposed anesthesia with the patient or authorized representative who has indicated his/her understanding and acceptance.     Dental advisory given  Plan Discussed with: Anesthesiologist and CRNA  Anesthesia Plan Comments:        Anesthesia Quick Evaluation

## 2021-07-28 NOTE — PMR Pre-admission (Shared)
PMR Admission Coordinator Pre-Admission Assessment  Patient: Jason Figueroa is an 56 y.o., male MRN: [id number] DOB: [birth year] Height: _0  (175.3 cm) Weight: 88.2 kg  Insurance Information HMO:     PPO:      PCP:      IPA:      80/20:      OTHER:  PRIMARY: uninsured. Provided estimate verbally 2/7 and on paper at admit.       Policy#:       Subscriber:  CM Name:       Phone#:      Fax#:  Pre-Cert#:       Employer:  Benefits:  Phone #:      Name:  Eff. Date:      Deduct:       Out of Pocket Max:       Life Max:  CIR:       SNF:  Outpatient:      Co-Pay:  Home Health:       Co-Pay:  DME:      Co-Pay:  Providers:  SECONDARY:       Policy#:      Phone#:   Development worker, community:       Phone#:   The Actuary for patients in Inpatient Rehabilitation Facilities with attached Privacy Act [place] Records was provided and verbally reviewed with: n/a  Emergency Contact Information Contact Information     Name Relation Home Work Mobile   Clarissa Mother   [phone]   [facility] Brother [phone]     Clayton,Heather Daughter   [phone]       Current Medical History  Patient Admitting Diagnosis: CVA multifocal embolic pattern  History of Present Illness: Pt is a 56 y/o male with PMH Of CVA, recent TIA, and HTN presented to [facility] on 2/4 as a Code Stroke with L facial numbness and tingling as well as falling with L hemiparesis.  Pt outside tPA window but was laoded with aspirin and plavix.  MRI showed scattered acute infarcts concerning for embolic process.  Multiple chronic infarcts also visualized and chronic ischemic disease noted.  NO evidence of large vessel occlusion on MRA.  Several areas of intracranial stenosis, most severe in branches of the right and left PCA.  Pt was HTN on arrival, but allowing permissive HTN.  EKG normal.  Hbg A1C 5.9.  Lipids 320, LDL 244, HDL 50.  UDS positive for amphetamines, reports taking  Adderall several days prior to admit.  Pt started on high intensity statin.  TEE on 2/7 showed ***.  Therapy evaluations were completed and pt was recommended for CIR.   Complete NIHSS TOTAL: 10  [facility]'s [facility] record [facility] has been reviewed by the rehabilitation admission coordinator and physician.  Past Medical History  Past Medical History:  Diagnosis Date   Hypertension    Stroke [facility])     Has the patient had major surgery during 100 days prior to admission? ***  Family History   family history is not on file.  Current Medications  Current Facility-Administered Medications:    acetaminophen (TYLENOL) tablet 650 mg, 650 mg, Oral, Q6H PRN, 650 mg at 07/25/21 1702 **OR** acetaminophen (TYLENOL) suppository 650 mg, 650 mg, Rectal, Q6H PRN, Skeet Latch, MD   aspirin chewable tablet 81 mg, 81 mg, Oral, Daily, Skeet Latch, MD, 81 mg at 07/28/21 0954   atorvastatin (LIPITOR) tablet 80 mg, 80 mg, Oral, Daily, Skeet Latch, MD, 80 mg at 07/28/21  0951   clopidogrel (PLAVIX) tablet 75 mg, 75 mg, Oral, Daily, Skeet Latch, MD, 75 mg at 07/28/21 0951   cyclobenzaprine (FLEXERIL) tablet 5 mg, 5 mg, Oral, Daily PRN, Skeet Latch, MD, 5 mg at 07/27/21 2227   enoxaparin (LOVENOX) injection 40 mg, 40 mg, Subcutaneous, Q24H, Skeet Latch, MD, 40 mg at 07/27/21 1504   gabapentin (NEURONTIN) capsule 300 mg, 300 mg, Oral, TID, Skeet Latch, MD, 300 mg at 07/28/21 0950   hydrocortisone sodium succinate (SOLU-CORTEF) 100 MG injection 100 mg, 100 mg, Intravenous, Q8H, Skeet Latch, MD, 100 mg at 07/28/21 0531   levothyroxine (SYNTHROID, LEVOTHROID) injection 50 mcg, 50 mcg, Intravenous, Daily, Skeet Latch, MD, 50 mcg at 07/28/21 0949   lisinopril (ZESTRIL) tablet 20 mg, 20 mg, Oral, Once, Lajean Manes, MD   Derrill Memo ON 07/29/2021] lisinopril (ZESTRIL) tablet 40 mg, 40 mg, Oral, Daily, Lajean Manes, MD  Patients Current Diet:  Diet Order              Diet regular Room service appropriate? Yes; Fluid consistency: Thin  Diet effective now                   Precautions / Restrictions Precautions Precautions: Fall Precaution Comments: L hemiparesis Restrictions Weight Bearing Restrictions: No   Has the patient had 2 or more falls or a fall with injury in the past year? Yes  Prior Activity Level Community (5-7x/wk): caring for his mother, driving to all her appointments, no DME used  Prior Functional Level Self Care: Did the patient need help bathing, dressing, using the toilet or eating? Independent  Indoor Mobility: Did the patient need assistance with walking from room to room (with or without device)? Independent  Stairs: Did the patient need assistance with internal or external stairs (with or without device)? Independent  Functional Cognition: Did the patient need help planning regular tasks such as shopping or remembering to take medications? Independent  Patient Information Are you of Hispanic, Latino/a,or Spanish origin?: A. No, not of Hispanic, Latino/a, or Spanish origin What is your race?: A. White Do you need or want an interpreter to communicate with a doctor or health care staff?: 0. No  Patient's Response To:  Health Literacy and Transportation Is the patient able to respond to health literacy and transportation needs?: Yes Health Literacy - How often do you need to have someone help you when you read instructions, pamphlets, or other written material from your doctor or pharmacy?: Never In the past 12 months, has lack of transportation kept you from medical appointments or from getting medications?: No In the past 12 months, has lack of transportation kept you from meetings, work, or from getting things needed for daily living?: No  [place] / [place] Devices/Equipment: Blood pressure cuff, Eyeglasses, Environmental consultant (specify type), Wheelchair, Radio producer (specify quad or straight) Home  Equipment: Conservation officer, nature (2 wheels)  Prior Device Use: Indicate devices/aids used by the patient prior to current illness, exacerbation or injury? None of the above  Current Functional Level Cognition  Overall Cognitive Status: Within Functional Limits for tasks assessed Orientation Level: Oriented X4 General Comments: follows commands with increased time. lethargic throughout with eyes closed 50% of the time.    Extremity Assessment (includes Sensation/Coordination)  Upper Extremity Assessment: LUE deficits/detail LUE Deficits / Details: PROM WFL, flaccid - small trace movement at hand. Able to adduct extremety slightly when supine LUE Sensation: WNL, decreased proprioception LUE Coordination: decreased fine motor, decreased gross motor  Lower Extremity  Assessment: Defer to PT evaluation LLE Deficits / Details: PROM WFL, some hip flexion activation noted, pt with flexor spasticity in LLE. profound weakness    ADLs  Overall ADL's : Needs assistance/impaired Eating/Feeding: NPO Grooming: Moderate assistance, Sitting Upper Body Bathing: Moderate assistance, Sitting Lower Body Bathing: Maximal assistance, +2 for physical assistance, +2 for safety/equipment, Sit to/from stand Upper Body Dressing : Moderate assistance, Sitting Lower Body Dressing: +2 for physical assistance, +2 for safety/equipment, Sit to/from stand Toilet Transfer: Maximal assistance, +2 for physical assistance, +2 for safety/equipment, Stand-pivot, BSC/3in1 Toileting- Clothing Manipulation and Hygiene: Maximal assistance, +2 for physical assistance, +2 for safety/equipment, Sit to/from stand Functional mobility during ADLs: Maximal assistance General ADL Comments: L hemi, lethargy, poor sitting balance    Mobility  Overal bed mobility: Needs Assistance Bed Mobility: Sit to Supine Supine to sit: Mod assist Sit to supine: Mod assist General bed mobility comments: assist with LLE, verbal cues for positioning     Transfers  Overall transfer level: Needs assistance Equipment used: 1 person hand held assist Transfers: Sit to/from Stand, Bed to chair/wheelchair/BSC Sit to Stand: Max assist Bed to/from chair/wheelchair/BSC transfer type:: Stand pivot Stand pivot transfers: Max assist General transfer comment: Pt fatigued and lethargic this session and required incrased assist. verbal cues for R sided positioning, to power into standing, block LLE and manage L extremeties.    Ambulation / Gait / Stairs / Wheelchair Mobility  Ambulation/Gait Ambulation/Gait assistance:  (deferred 2/2 high falls risk)    Posture / Balance Dynamic Sitting Balance Sitting balance - Comments: min-modA with UE support of railing Balance Overall balance assessment: Needs assistance Sitting-balance support: Single extremity supported, Feet supported Sitting balance-[name] Scale: Poor Sitting balance - Comments: min-modA with UE support of railing Postural control: Left lateral lean    Special needs/care consideration Diabetic management yes, prediabetic   Previous Home Environment (from acute therapy documentation) Living Arrangements: Parent Available Help at Discharge: Family, Available PRN/intermittently Type of Home: Mobile home Home Layout: One level Home Access: Ramped entrance Bathroom Shower/Tub: Chiropodist: Standard Home Care Services: No Additional Comments: pt moved back to [place] to assist his mother, she unable to assist him  Discharge Living Setting Plans for Discharge Living Setting: Lives with (comment) (mother) Type of Home at Discharge: Mobile home Discharge Home Layout: One level Discharge Home Access: Ramped entrance Discharge Bathroom Shower/Tub: Tub/shower unit, Walk-in shower Discharge Bathroom Toilet: Standard Discharge Bathroom Accessibility: No Does the patient have any problems obtaining your medications?: Yes (Describe) (uninsured)  Social/Family/Support  Systems Patient Roles: Caregiver Anticipated Caregiver: daughter, Tyreece Gelles Anticipated Caregiver's Contact Information: [phone] Ability/Limitations of Caregiver: *** Caregiver Availability: 24/7 Does Caregiver/Family have Issues with Lodging/Transportation while Pt is in Rehab?: No  Goals Patient/Family Goal for Rehab: PT/OT supervision, SLP mod I Expected length of stay: 12-14 days Pt/Family Agrees to Admission and willing to participate: Yes Program Orientation Provided & Reviewed with Pt/Caregiver Including Roles  & Responsibilities: Yes  Barriers to Discharge: Decreased caregiver support, Insurance for SNF coverage, Home environment access/layout  Decrease burden of Care through IP rehab admission: n/a  Possible need for SNF placement upon discharge: not anticipated.   Patient Condition: I have reviewed medical records from [facility], spoken with CM, and patient. I met with patient at the bedside for inpatient rehabilitation assessment, and discussed with daughter over the phone.  Patient will benefit from ongoing PT, OT, and SLP, can actively participate in 3 hours of therapy a day 5 days of the week,  and can make measurable gains during the admission.  Patient will also benefit from the coordinated team approach during an Inpatient Acute Rehabilitation admission.  The patient will receive intensive therapy as well as Rehabilitation physician, nursing, social worker, and care management interventions.  Due to bladder management, bowel management, safety, skin/wound care, disease management, medication administration, pain management, and patient education the patient requires 24 hour a day rehabilitation nursing.  The patient is currently mod assist with mobility and basic ADLs.  Discharge setting and therapy post discharge at  home  is anticipated.  Patient has agreed to participate in the Acute Inpatient Rehabilitation Program and will admit ***.  Preadmission Screen  Completed By: Shann Medal, PT, DPT and updates by Michel Santee, 07/28/2021 1:45 PM ______________________________________________________________________   Discussed status with Dr. Marland Kitchen on *** at *** and received approval for admission today.  Admission Coordinator: Shann Medal, PT, DPT;  Michel Santee, PT, time Marland KitchenSudie Grumbling ***   Assessment/Plan: Diagnosis: Does the need for close, 24 hr/day Medical supervision in concert with the patient's rehab needs make it unreasonable for this patient to be served in a less intensive setting? {yes_no_potentially:3041433} Co-Morbidities requiring supervision/potential complications: *** Due to {due VI:1537943}, does the patient require 24 hr/day rehab nursing? {yes_no_potentially:3041433} Does the patient require coordinated care of a physician, rehab nurse, PT, OT, and SLP to address physical and functional deficits in the context of the above medical diagnosis(es)? {yes_no_potentially:3041433} Addressing deficits in the following areas: {deficits:3041436} Can the patient actively participate in an intensive therapy program of at least 3 hrs of therapy 5 days a week? {yes_no_potentially:3041433} The potential for patient to make measurable gains while on inpatient rehab is {potential:3041437} Anticipated functional outcomes upon discharge from inpatient rehab: {functional outcomes:304600100} PT, {functional outcomes:304600100} OT, {functional outcomes:304600100} SLP Estimated rehab length of stay to reach the above functional goals is: *** Anticipated discharge destination: {anticipated dc setting:21604} 10. Overall Rehab/Functional Prognosis: {potential:3041437}   MD Signature: ***

## 2021-07-28 NOTE — Anesthesia Procedure Notes (Signed)
Procedure Name: MAC Date/Time: 07/28/2021 11:15 AM Performed by: Griffin Dakin, CRNA Pre-anesthesia Checklist: Patient identified, Emergency Drugs available, Suction available, Patient being monitored and Timeout performed Patient Re-evaluated:Patient Re-evaluated prior to induction Oxygen Delivery Method: Simple face mask Induction Type: IV induction Airway Equipment and Method: Patient positioned with wedge pillow Placement Confirmation: positive ETCO2 and breath sounds checked- equal and bilateral Dental Injury: Teeth and Oropharynx as per pre-operative assessment

## 2021-07-28 NOTE — Progress Notes (Signed)
Physical Therapy Treatment Patient Details Name: Jason Figueroa MRN: [id number] DOB: [birth year] Today's Date: 07/28/2021   History of Present Illness 56 y.o. male presents to [facility] hospital on 07/25/2021 with numbness and tingling on left side of face along with L sided weakness. 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 PMH inlcudes HTN, previous stroke,  and recent TIA.    PT Comments    Focus of session was bed mobility and transfers, pt overall requiring mod A x2 Pt. Requires trunk support and help managing hemiparetic side. He was unable to balance without trunk support and L LE blocking during stance and pre-gait weight shifting was not tolerated well because of the L LE paresis. Pt perfromed squat pivot transfer to chair mod A x2 with min difficulty. He followed simple one step commands throughout the session but remained impulsive. Plan to continue to address functional mobility, standing, and gait as tolerated to maximize function. PT to follow up acutely as able.    Recommendations for follow up therapy are one component of a multi-disciplinary discharge planning process, led by the attending physician.  Recommendations may be updated based on patient status, additional functional criteria and insurance authorization.  Follow Up Recommendations  Acute inpatient rehab (3hours/day)     Assistance Recommended at Discharge Frequent or constant Supervision/Assistance  Patient can return home with the following A lot of help with walking and/or transfers;A lot of help with bathing/dressing/bathroom;Assistance with cooking/housework;Assistance with feeding;Direct supervision/assist for medications management;Direct supervision/assist for financial management;Assist for transportation;Help with stairs or ramp for entrance   Equipment Recommendations  Wheelchair (measurements PT);Wheelchair cushion  (measurements PT);Hospital bed    Recommendations for Other Services Rehab consult     Precautions / Restrictions Precautions Precautions: Fall Precaution Comments: L hemiparesis     Mobility  Bed Mobility Overal bed mobility: Needs Assistance Bed Mobility: Sit to Supine     Supine to sit: Mod assist, +2 for physical assistance     General bed mobility comments: assist with LLE, verbal cues for directions, trunk support, LE management    Transfers Overall transfer level: Needs assistance Equipment used: 1 person hand held assist Transfers: Sit to/from Stand, Bed to chair/wheelchair/BSC Sit to Stand: Mod assist, +2 physical assistance     Squat pivot transfers: Mod assist, +2 physical assistance     General transfer comment: Pt. impulsive and only follows one step commands with increased time to process. Cuing for positioning and support for power up. Blocked LLE during stand and support from the right side.    Ambulation/Gait             Pre-gait activities: Attempted to use LLE pt. unable to lift or lock knee suring stance or weight shifting     Stairs             Wheelchair Mobility    Modified Rankin (Stroke Patients Only) Modified Rankin (Stroke Patients Only) Pre-Morbid Rankin Score: No symptoms Modified Rankin: Severe disability     Balance Overall balance assessment: Needs assistance Sitting-balance support: Single extremity supported, Feet supported Sitting balance-[name] Scale: Poor Sitting balance - Comments: mod A with UE support Postural control: Left lateral lean Standing balance support: Bilateral upper extremity supported, Reliant on assistive device for balance   Standing balance comment: Able to come to stand with mod A x2, requires LLE blocking, trunk and R UE support needed  Cognition Arousal/Alertness:  (Labile) Behavior During Therapy: WFL for tasks assessed/performed Overall  Cognitive Status: Impaired/Different from baseline Area of Impairment: Following commands                       Following Commands: Follows one step commands with increased time       General Comments: follows simple commands, impulsive, increased time to process        Exercises      General Comments        Pertinent Vitals/Pain      Home Living                          Prior Function            PT Goals (current goals can now be found in the care plan section) Acute Rehab PT Goals Patient Stated Goal: to regain strength and independence PT Goal Formulation: With patient Time For Goal Achievement: 08/10/21 Potential to Achieve Goals: Good Progress towards PT goals: Progressing toward goals    Frequency    Min 4X/week      PT Plan      Co-evaluation              AM-PAC PT "6 Clicks" Mobility   Outcome Measure  Help needed turning from your back to your side while in a flat bed without using bedrails?: A Lot Help needed moving from lying on your back to sitting on the side of a flat bed without using bedrails?: A Lot Help needed moving to and from a bed to a chair (including a wheelchair)?: A Lot Help needed standing up from a chair using your arms (e.g., wheelchair or bedside chair)?: A Lot Help needed to walk in hospital room?: Total Help needed climbing 3-5 steps with a railing? : Total 6 Click Score: 10    End of Session   Activity Tolerance: Patient tolerated treatment well Patient left: in chair;with call bell/phone within reach;with chair alarm set Nurse Communication: Mobility status;Need for lift equipment PT Visit Diagnosis: Other abnormalities of gait and mobility (R26.89);Muscle weakness (generalized) (M62.81);Other symptoms and signs involving the nervous system (R29.898)     Time: 4270-6237 PT Time Calculation (min) (ACUTE ONLY): 19 min  Charges:  $Therapeutic Activity: 8-22 mins                      Lorie Apley, SPT Acute Rehab Services    Lorie Apley 07/28/2021, 4:18 PM

## 2021-07-28 NOTE — Anesthesia Postprocedure Evaluation (Signed)
Anesthesia Post Note  Patient: Jason Figueroa  Procedure(s) Performed: TRANSESOPHAGEAL ECHOCARDIOGRAM (TEE) BUBBLE STUDY     Patient location during evaluation: Endoscopy Anesthesia Type: MAC Level of consciousness: awake and alert Pain management: pain level controlled Vital Signs Assessment: post-procedure vital signs reviewed and stable Respiratory status: spontaneous breathing and respiratory function stable Cardiovascular status: stable Postop Assessment: no apparent nausea or vomiting Anesthetic complications: no   No notable events documented.  Last Vitals:  Vitals:   07/28/21 1153 07/28/21 1203  BP: (!) 176/91 (!) 183/80  Pulse: 75 65  Resp: 11 15  Temp:    SpO2: 100% 96%    Last Pain:  Vitals:   07/28/21 1203  TempSrc:   PainSc: 0-No pain                 Senica Crall DANIEL

## 2021-07-28 NOTE — Progress Notes (Signed)
Inpatient Rehab Admissions Coordinator:   Met with patient at the bedside to discuss CIR recommendations and goals/expectations of CIR stay.  Reviewed 3 hrs/day of therapy, average length of stay 2 weeks, and physician follow up.  He states he lives with his mother and his daughter can stay with him at discharge.  I left her a message to confirm support and answer her questions.  I reviewed cost of care and will ask to be screened for medicaid.  He is agreeable to CIR.  Will follow.   Shann Medal, PT, DPT Admissions Coordinator [phone] 07/28/21  1:42 PM

## 2021-07-28 NOTE — CV Procedure (Signed)
Brief TEE Note  LVEF 60-65% Trivial MR, trivial PR, trivial TR No LA/LAA thrombus or masses +Large PFO with right to left shunting  For additional details see full report.  Kaysee Hergert C. Duke Salvia, MD, [facility] 07/28/2021 11:37 AM

## 2021-07-28 NOTE — Transfer of Care (Signed)
Immediate Anesthesia Transfer of Care Note  Patient: JAREB RADONCIC  Procedure(s) Performed: TRANSESOPHAGEAL ECHOCARDIOGRAM (TEE) BUBBLE STUDY  Patient Location: Endoscopy Unit  Anesthesia Type:MAC  Level of Consciousness: awake, alert  and oriented  Airway & Oxygen Therapy: Patient Spontanous Breathing and Patient connected to face mask oxygen  Post-op Assessment: Report given to RN and Post -op Vital signs reviewed and stable  Post vital signs: Reviewed and stable  Last Vitals:  Vitals Value Taken Time  BP 176/91 07/28/21 1151  Temp 36.9 C 07/28/21 1143  Pulse 73 07/28/21 1153  Resp 16 07/28/21 1153  SpO2 100 % 07/28/21 1153  Vitals shown include unvalidated device data.  Last Pain:  Vitals:   07/28/21 1143  TempSrc: Temporal  PainSc: Asleep      Patients Stated Pain Goal: 0 (31/49/70 2637)  Complications: No notable events documented.

## 2021-07-28 NOTE — H&P (Signed)
°  Jason Figueroa is a 56 y.o. male who has presented today for surgery, with the diagnosis of stroke.  The various methods of treatment have been discussed with the patient and family. After consideration of risks, benefits and other options for treatment, the patient has consented to  Procedure(s): TRANSESOPHAGEAL ECHOCARDIOGRAM (TEE) (N/A) as a surgical intervention .  The patient's history has been reviewed, patient examined, no change in status, stable for surgery.  I have reviewed the patient's chart and labs.  Questions were answered to the patient's satisfaction.    Victorious Cosio C. Duke Salvia, MD, [facility]  07/28/2021 11:04 AM

## 2021-07-29 ENCOUNTER — Encounter (HOSPITAL_COMMUNITY): Payer: Self-pay | Admitting: Internal Medicine

## 2021-07-29 DIAGNOSIS — R531 Weakness: Secondary | ICD-10-CM

## 2021-07-29 LAB — BASIC METABOLIC PANEL
Anion gap: 9 (ref 5–15)
BUN: 24 mg/dL — ABNORMAL HIGH (ref 6–20)
CO2: 25 mmol/L (ref 22–32)
Calcium: 8.7 mg/dL — ABNORMAL LOW (ref 8.9–10.3)
Chloride: 107 mmol/L (ref 98–111)
Creatinine, Ser: 1.19 mg/dL (ref 0.61–1.24)
GFR, Estimated: >60 mL/min (ref >60)
Glucose, Bld: 107 mg/dL — ABNORMAL HIGH (ref 70–99)
Potassium: 3.8 mmol/L (ref 3.5–5.1)
Sodium: 141 mmol/L (ref 135–145)

## 2021-07-29 LAB — T4, FREE: Free T4: 0.41 ng/dL — ABNORMAL LOW (ref 0.61–1.12)

## 2021-07-29 MED ORDER — CYCLOBENZAPRINE HCL 10 MG PO TABS
5.0000 mg | ORAL_TABLET | Freq: Once | ORAL | Status: AC
  Administered 2021-07-29: 5 mg via ORAL
  Filled 2021-07-29: qty 1

## 2021-07-29 MED ORDER — POLYETHYLENE GLYCOL 3350 17 G PO PACK
17.0000 g | PACK | Freq: Every day | ORAL | Status: DC
  Administered 2021-07-29 – 2021-09-10 (×38): 17 g via ORAL
  Filled 2021-07-29 (×43): qty 1

## 2021-07-29 MED ORDER — LEVOTHYROXINE SODIUM 100 MCG/5ML IV SOLN
50.0000 ug | Freq: Once | INTRAVENOUS | Status: AC
  Administered 2021-07-29: 50 ug via INTRAVENOUS
  Filled 2021-07-29: qty 5

## 2021-07-29 MED ORDER — LEVOTHYROXINE SODIUM 100 MCG PO TABS
100.0000 ug | ORAL_TABLET | Freq: Every day | ORAL | Status: DC
  Administered 2021-07-30 – 2021-09-11 (×42): 100 ug via ORAL
  Filled 2021-07-29 (×45): qty 1

## 2021-07-29 MED ORDER — SENNOSIDES-DOCUSATE SODIUM 8.6-50 MG PO TABS
1.0000 | ORAL_TABLET | Freq: Every evening | ORAL | Status: DC | PRN
  Administered 2021-07-29 – 2021-08-12 (×3): 1 via ORAL
  Filled 2021-07-29 (×3): qty 1

## 2021-07-29 MED ORDER — AMLODIPINE BESYLATE 5 MG PO TABS
5.0000 mg | ORAL_TABLET | Freq: Every day | ORAL | Status: DC
  Administered 2021-07-29: 5 mg via ORAL
  Filled 2021-07-29: qty 1

## 2021-07-29 NOTE — H&P (Incomplete)
Physical Medicine and Rehabilitation Admission H&P    Chief Complaint  Patient presents with   Functional deficits due to stroke.     HPI: Jason Figueroa is a 56 year old male with history of HTN, chronic pain, prior stroke, TIA few months ago; who was admitted with numbness/tingling of face that started the day before and progressed to LUE/LLE weakness on 07/25/21. UDS positive for amphetamines. CT head showed lacunar infarcts and chronic small vessel disease. MRI/MRA brain done showing acute infarcts high right frontal lobe, right corona radiata/basal ganglia, left corona radiata, left temporal lobe/hippocampus and left callosal genu concerning for embolic process and severe stenosis P2 right PCA and moderate/severe stenosis L-PCA at P2/P3 junction. Dr. Roda Shutters recommended TEE with loop recorder to rule out cardiac source of embolism as well as DAPT X 3 weeks followed by ASA alone.  Patient with history of substance abuse--cocaine in the past and THC intermittently. TEE done revealing EF 60-65% and large PFO with right to left shunting. BLE dopplers done for follow up and were negative for DVT.   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   He was started on IV synthroid as well as solu cortef with improvement and transitioned to po levothyroxine today. He developed AKI with rise in BUN/SCr to  23/1.39 due to urinary retention but refused I/O cath. Renal ultrasound showed normal echogenicity.  He was noted to have desaturation with apnea per reports and outpatient sleep study recommended as well as 30 day event monitor to rule out Afib. He is to follow up with Dr. Excell Seltzer for input on PFO closure (not felt to be an ideal candidate). Blood pressure have been labile and medications titrated for better control. Lethargy has resolved and steroids discontinued. He  continues to be limited by left hemiplegia with balance deficits, poor safety awareness with impulsivity and delayed in processing with increased time needed for simple one step commands. CIR recommended due to  functional decline.     Review of Systems  Constitutional:  Negative for chills and fever.  HENT:  Negative for hearing loss.   Eyes:  Negative for blurred vision and double vision.  Respiratory:  Negative for cough.   Cardiovascular:  Negative for chest pain and leg swelling.  Gastrointestinal:  Positive for constipation. Negative for abdominal pain and vomiting.  Genitourinary:  Negative for dysuria, frequency and urgency.  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  Skin:  Negative for rash.  Neurological:  Positive for speech change and weakness. Negative for headaches.  Psychiatric/Behavioral:  The patient has insomnia (due to RLS and now muscle spasms.).     Past Medical History:  Diagnosis Date   Hypertension    Stroke [facility])     Past Surgical History:  Procedure Laterality Date   BACK SURGERY  1991   lumbar surgery   BUBBLE STUDY  07/28/2021   Procedure: BUBBLE STUDY;  Surgeon: Chilton Si, MD;  Location: [facility] ENDOSCOPY;  Service: Cardiovascular;;   TEE WITHOUT CARDIOVERSION N/A 07/28/2021   Procedure: TRANSESOPHAGEAL ECHOCARDIOGRAM (TEE);  Surgeon: Chilton Si, MD;  Location: [facility] ENDOSCOPY;  Service: Cardiovascular;  Laterality: N/A;     Family History  Problem Relation Age of Onset   Diverticulitis Mother    Alcoholism Father    Alcoholism Sister  Social History:  From [place] to [place] to live/assist his mother  week prior to admission.   He  reports that he has been smoking cigarettes. He has never used smokeless tobacco. No history on file for alcohol use and drug use.   Allergies  Allergen Reactions   Ivp Dye [Iodinated Contrast Media] Other (See Comments)    Redness around neck   Medications Prior to Admission   Medication Sig Dispense Refill   Aromatic Inhalants ([employer] NASAL DECONGESTANT IN) Inhale 1 spray into the lungs as needed (congestion).     gabapentin (NEURONTIN) 600 MG tablet Take 600 mg by mouth daily.     lisinopril (ZESTRIL) 20 MG tablet Take 20 mg by mouth daily.     lisinopril (ZESTRIL) 40 MG tablet Take 40 mg by mouth daily.        Home: Home Living Family/patient expects to be discharged to:: Private residence Living Arrangements: Parent Available Help at Discharge: Family, Available PRN/intermittently Type of Home: Mobile home Home Access: Ramped entrance Home Layout: One level Bathroom Shower/Tub: Engineer, manufacturing systems: Standard Home Equipment: Agricultural consultant (2 wheels) Additional Comments: pt moved back to [place] to assist his mother, she unable to assist him   Functional History: Prior Function Prior Level of Function : Independent/Modified Independent Mobility Comments: pt reports ambulating without use of a device, recently moved from [facility] ADLs Comments: indep, drives, does not work  Functional Status:  Mobility: Bed Mobility Overal bed mobility: Needs Assistance Bed Mobility: Rolling, Sidelying to Sit Rolling: Mod assist Sidelying to sit: Mod assist, HOB elevated Supine to sit: Mod assist Sit to supine: Mod assist General bed mobility comments: with moderate cuing for pt to execute, including using RLE to assist LLE Transfers Overall transfer level: Needs assistance Equipment used:  (chair back) Transfers: Sit to/from Stand Sit to Stand: Min assist Bed to/from chair/wheelchair/BSC transfer type:: Squat pivot Stand pivot transfers: Mod assist Squat pivot transfers: Mod assist (x4) Transfer via Lift Equipment: Stedy General transfer comment: x3 cues for technique, balanace and safety Ambulation/Gait Ambulation/Gait assistance:  (deferred 2/2 high falls risk) General Gait Details: not able Pre-gait activities: wt shift in stedy,  pt unable to extend at L knee or maintain without buckling with shift to L    ADL: ADL Overall ADL's : Needs assistance/impaired Eating/Feeding: Set up Eating/Feeding Details (indicate cue type and reason): assist for packaging Grooming: Wash/dry hands, Wash/dry face, Oral care, Minimal assistance, Sitting Grooming Details (indicate cue type and reason): performed seated in recliner with education on one handed technique for applying toothpaste Upper Body Bathing: Min guard, Sitting Upper Body Bathing Details (indicate cue type and reason): close min guard, some LOB but able to self correct Lower Body Bathing: Moderate assistance, Sit to/from stand, Cueing for safety, Cueing for sequencing, Cueing for compensatory techniques Lower Body Bathing Details (indicate cue type and reason): bathed peri area in standing with LLE blocked and mod A fro balance Upper Body Dressing : Minimal assistance, Sitting Upper Body Dressing Details (indicate cue type and reason): cues for hemi arm in first Lower Body Dressing: +2 for physical assistance, +2 for safety/equipment, Sit to/from stand Toilet Transfer: Stand-pivot, BSC/3in1, Moderate assistance Toilet Transfer Details (indicate cue type and reason): simulated. bed>chair. assist for cues, LLE blocking Toileting- Clothing Manipulation and Hygiene: Maximal assistance, +2 for physical assistance, +2 for safety/equipment, Sit to/from stand Functional mobility during ADLs: Moderate assistance, Minimal assistance General ADL Comments: total body bathing this session while sitting EOB and  standing at EOB, followed cues well  Cognition: Cognition Overall Cognitive Status: Impaired/Different from baseline Orientation Level: Oriented X4 Cognition Arousal/Alertness: Awake/alert Behavior During Therapy: WFL for tasks assessed/performed Overall Cognitive Status: Impaired/Different from baseline Area of Impairment: Awareness Current Attention Level:  Selective Following Commands: Follows one step commands consistently Safety/Judgement: Decreased awareness of safety, Decreased awareness of deficits Awareness: Emergent Problem Solving: Slow processing, Requires verbal cues, Requires tactile cues General Comments: concerned lack of progress per his expectations, "i'm losing motivation"   Blood pressure 129/81, pulse (!) 52, temperature 97.9 F (36.6 C), temperature source Oral, resp. rate 16, height 5\' 9"  (1.753 m), weight 88.2 kg, SpO2 97 %. Physical Exam Vitals and nursing note reviewed.  Constitutional:      Appearance: Normal appearance.  Neurological:     Mental Status: He is alert and oriented to person, place, and time.     Comments: Eating lunch with spaghetti hanging out of mouth on the left. Left facial weakness with mild dysarthria. Able to follow simple one step commands once engaged and able to focus on tasks. Left hemiplegia with emerging flexor tone.     No results found for this or any previous visit (from the past 48 hour(s)).  No results found.    Blood pressure 129/81, pulse (!) 52, temperature 97.9 F (36.6 C), temperature source Oral, resp. rate 16, height 5\' 9"  (1.753 m), weight 88.2 kg, SpO2 97 %.  Medical Problem List and Plan: 1. Functional deficits secondary to ***  -patient may *** shower  -ELOS/Goals: *** 2.  Antithrombotics: -DVT/anticoagulation:  Pharmaceutical: Lovenox  -antiplatelet therapy: DAPT X 3 weeks followed by ASA alone on 08/17/21 3. Pain Management: Used to be on Sobaxone, klonpin and xanax in the past. Has history of opioid abuse.  --Continue gabapentin 300 mg TID.  4. Mood: LCSW to follow for evaluation and support.   -antipsychotic agents: NA 5. Neuropsych: This patient may be intermittently capable of making decisions on his own behalf. 6. Skin/Wound Care: Routine pressure relief measures.  7. Fluids/Electrolytes/Nutrition: Monitor I/O. Check CMET in am.  8. HTN: Monitor BP  TID--continue Norvasc. Lisinopril added 02/07  --monitor renal status with serial checks. 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  --Repeat T4- 0.41 on 02/08. T# level pending.  --Recheck thyroid studies in a month. 10. Urinary retention?: Will monitor voiding with PVR checks.  --toilet patient every 3-4 hours. 11. CKD?: SCr 1.39 at admission. Pre-renal azotemia noted with BUN up to 23 --encourage fluid intake.  12. Acute renal failure: Recheck labs in am.  13. Impaired fasting glucose: Hgb A1c-5.9 14. Muscle spasms: Will schedule baclofen 5 mg TID with prn doses --has history of RLE and has used klonpin in the past.  --Monitor for sedative SE.    ***  04/07, PA-C 08/25/2021

## 2021-07-29 NOTE — Progress Notes (Signed)
Inpatient Rehab Admissions Coordinator:  Attempted to get in touch with pt's daughter, Herbert Seta to confirm dispo.  Have not received a return call. Will continue to follow.   Wolfgang Phoenix, MS, CCC-SLP Admissions Coordinator [phone]

## 2021-07-29 NOTE — Progress Notes (Signed)
Occupational Therapy Treatment Patient Details Name: Jason Figueroa MRN: [id number] DOB: [birth year] Today's Date: 07/29/2021   History of present illness 56 y.o. male presents to [facility] hospital on 07/25/2021 with numbness and tingling on left side of face along with L sided weakness. 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 PMH inlcudes HTN, previous stroke,  and recent TIA.   OT comments  Timothy is progressing incrementally. He continues to require close min guard for static sitting with RUE supported and mod A for any dynamic task in sitting due to L lateral/posterior lean. He lacks insight/problem solving and is impulsive with RUE movement which throws him off balance, unable to self correct. Mod-max A for sit<>stand and stand pivots. Pt motivated and thankful for therapy this session. D/c remains appropriate, OT to continue to follow acutely.    Recommendations for follow up therapy are one component of a multi-disciplinary discharge planning process, led by the attending physician.  Recommendations may be updated based on patient status, additional functional criteria and insurance authorization.    Follow Up Recommendations  Acute inpatient rehab (3hours/day)    Assistance Recommended at Discharge Frequent or constant Supervision/Assistance  Patient can return home with the following  A lot of help with walking and/or transfers;A lot of help with bathing/dressing/bathroom;Assistance with cooking/housework;Direct supervision/assist for medications management;Assist for transportation;Help with stairs or ramp for entrance   Equipment Recommendations  BSC/3in1;Wheelchair cushion (measurements OT);Wheelchair (measurements OT);Tub/shower bench       Precautions / Restrictions Precautions Precautions: Fall Precaution Comments: L hemiparesis Restrictions Weight Bearing Restrictions: No       Mobility  Bed Mobility Overal bed mobility: Needs Assistance Bed Mobility: Sit to Supine     Supine to sit: Min assist     General bed mobility comments: assist for L side    Transfers Overall transfer level: Needs assistance Equipment used: 1 person hand held assist Transfers: Sit to/from Stand, Bed to chair/wheelchair/BSC Sit to Stand: Mod assist Stand pivot transfers: Max assist         General transfer comment: continues to be impulsive with movement with limited insight/problem solving. requires LLE blocked and step by step cues     Balance Overall balance assessment: Needs assistance Sitting-balance support: Single extremity supported, Feet supported Sitting balance-[name] Scale: Poor Sitting balance - Comments: mod A with UE support Postural control: Posterior lean, Left lateral lean Standing balance support: Bilateral upper extremity supported, Reliant on assistive device for balance Standing balance-[name] Scale: Poor               ADL either performed or assessed with clinical judgement   ADL Overall ADL's : Needs assistance/impaired     Grooming: Moderate assistance;Sitting;Oral care Grooming Details (indicate cue type and reason): assist for trunk balance and management of packaging. pt unable to use LUE as a functional assist this date                 Toilet Transfer: Maximal assistance;Stand-pivot;BSC/3in1 Toilet Transfer Details (indicate cue type and reason): simulated.         Functional mobility during ADLs: Maximal assistance General ADL Comments: L hemi, lethargy, poor sitting balance. working on sitting balance, trunk control and sit<>stand with pivoting to the R    Extremity/Trunk Assessment Upper Extremity Assessment Upper Extremity Assessment: LUE deficits/detail LUE Deficits / Details: PROM WFL, flaccid - small trace movement at hand. Able to adduct slightly when supine LUE Sensation: decreased  proprioception LUE Coordination: decreased  fine motor;decreased gross motor   Lower Extremity Assessment Lower Extremity Assessment: Defer to PT evaluation        Vision   Vision Assessment?: No apparent visual deficits   Perception Perception Perception: Not tested   Praxis Praxis Praxis: Not tested    Cognition Arousal/Alertness: Awake/alert Behavior During Therapy: WFL for tasks assessed/performed, Impulsive Overall Cognitive Status: Impaired/Different from baseline Area of Impairment: Safety/judgement         Safety/Judgement: Decreased awareness of deficits, Decreased awareness of safety     General Comments: follows simple commands, impulsive, increased time to process, limited insight              General Comments VSS on RA family present in the beginning    Pertinent Vitals/ Pain       Pain Assessment Pain Assessment: No/denies pain   Frequency  Min 2X/week        Progress Toward Goals  OT Goals(current goals can now be found in the care plan section)  Progress towards OT goals: Progressing toward goals  Acute Rehab OT Goals Patient Stated Goal: to get stronger OT Goal Formulation: With patient Time For Goal Achievement: 08/10/21 Potential to Achieve Goals: Good ADL Goals Pt Will Perform Grooming: with set-up;sitting Pt Will Perform Upper Body Dressing: with set-up;sitting Pt Will Perform Lower Body Dressing: with min guard assist;sit to/from stand Pt Will Transfer to Toilet: with min guard assist;stand pivot transfer;bedside commode Additional ADL Goal #1: pt will independently complete bed mobility as a precursor to ADLs  Plan Discharge plan remains appropriate       AM-PAC OT "6 Clicks" Daily Activity     Outcome Measure   Help from another person eating meals?: A Little Help from another person taking care of personal grooming?: A Little Help from another person toileting, which includes using toliet, bedpan, or urinal?: A Lot Help from another person bathing (including  washing, rinsing, drying)?: A Lot Help from another person to put on and taking off regular upper body clothing?: A Lot Help from another person to put on and taking off regular lower body clothing?: A Lot 6 Click Score: 14    End of Session Equipment Utilized During Treatment: Gait belt  OT Visit Diagnosis: Unsteadiness on feet (R26.81);Other abnormalities of gait and mobility (R26.89);Muscle weakness (generalized) (M62.81);Pain;Hemiplegia and hemiparesis;Feeding difficulties (R63.3) Hemiplegia - Right/Left: Left Hemiplegia - dominant/non-dominant: Non-Dominant Hemiplegia - caused by: Cerebral infarction   Activity Tolerance Patient tolerated treatment well   Patient Left in chair;with call bell/phone within reach   Nurse Communication Mobility status        Time: [id number] OT Time Calculation (min): 21 min  Charges: OT General Charges $OT Visit: 1 Visit OT Treatments $Self Care/Home Management : 8-22 mins   Keauna Brasel A Brette Cast 07/29/2021, 12:54 PM

## 2021-07-29 NOTE — Progress Notes (Signed)
PT Cancellation Note  Patient Details Name: Jason Figueroa MRN: [id number] DOB: 26-Aug-1965   Cancelled Treatment:    Reason Eval/Treat Not Completed: Patient declined, no reason specified. PT attempted to see pt twice on this date, initially pt declined due to fatigue, sleeping in bed upon PT arrival. Upon 2nd attempt pt eating while on bedpan. PT will attempt to follow up as time allows.   Arlyss Gandy 07/29/2021, 5:08 PM

## 2021-07-29 NOTE — Progress Notes (Signed)
° °  Subjective ON: had constipation relieved with miralax. Has muscle spasm that improved with flexeril.   Patient was seen at bedside during rounds. He is eating his breakfast. He has noticed minimal improvement in weakness.   Pt is updated on the plan for today, and all questions and concerns are addressed.   Objective:  Vital signs in last 24 hours: Vitals:   07/28/21 2328 07/29/21 0100 07/29/21 0208 07/29/21 0307  BP: (!) 189/88 (!) 189/101 (!) 143/85 (!) 163/83  Pulse: 69 63 (!) 51 (!) 50  Resp: 14 14 11 10   Temp: 97.9 F (36.6 C)   98.5 F (36.9 C)  TempSrc: Oral   Oral  SpO2: 96% 99% 98% 97%  Weight:      Height:       Constitutional: alert, well-appearing, in NAD  Cardiovascular: RRR, no m/r/g, non-edematous bilateral LE Pulmonary/Chest: normal work of breathing on room air, LCTAB Abdominal: soft, non-tender to palpation, non-distended MSK: normal bulk and tone, R>L Neurological: A&O x 3, follows commands. Ongoing left facial droop, dysarthria and left hemiplegia, with some improved L leg weakness.   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 30 day cardiac event monitoring at D/C. Some improvement in left LE strength today. BP still elevated 150/90. Stable for D/C pending placement.  - Neurology signed off; pt to see Dr Excell Seltzer in clinic in 4 weeks  - Not candidate for PFO closure; OP follow up w/ Dr Pearlean Brownie.  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 Free T4 improved to 0.41 wiht IV Synthroid. Improved lethargy and bradycardia. Transitioning to PO synthroid today.  - PO synthroid 100 mcg starting tmrw  - Recheck TSH and free T4 in 4-6 weeks  Urinary retention  Renal neg. Continues to produce >1L of urine.   Chronic pain  Muscle spasm  - Continue gabapentin 300mg  tid  - Continue Baclofen    Best Practice: Diet: NPO IVF: None,None VTE: Levenox 40 Code: Full   , MD  Internal Medicine Resident, PGY-1 Pager: [phone] After 5pm on weekdays and 1pm on weekends: On Call pager [phone]

## 2021-07-29 NOTE — Plan of Care (Signed)
Pt is alert oriented x 4. RA, pt has left sided weakness. Pt repositioned in bed. Pt has condom cath in place. Pt c/o muscle spams prn medications given per order.    Problem: Education: Goal: Knowledge of disease or condition will improve Outcome: Progressing Goal: Knowledge of secondary prevention will improve (SELECT ALL) Outcome: Progressing Goal: Knowledge of patient specific risk factors will improve (INDIVIDUALIZE FOR PATIENT) Outcome: Progressing Goal: Individualized Educational Video(s) Outcome: Progressing   Problem: Coping: Goal: Will verbalize positive feelings about self Outcome: Progressing   Problem: Coping: Goal: Will verbalize positive feelings about self Outcome: Progressing Goal: Will identify appropriate support needs Outcome: Progressing   Problem: Health Behavior/Discharge Planning: Goal: Ability to manage health-related needs will improve Outcome: Progressing   Problem: Self-Care: Goal: Ability to participate in self-care as condition permits will improve Outcome: Progressing Goal: Verbalization of feelings and concerns over difficulty with self-care will improve Outcome: Progressing Goal: Ability to communicate needs accurately will improve Outcome: Progressing   Problem: Nutrition: Goal: Risk of aspiration will decrease Outcome: Progressing   Problem: Ischemic Stroke/TIA Tissue Perfusion: Goal: Complications of ischemic stroke/TIA will be minimized Outcome: Progressing   Problem: Education: Goal: Knowledge of General Education information will improve Description: Including pain rating scale, medication(s)/side effects and non-pharmacologic comfort measures Outcome: Progressing   Problem: Clinical Measurements: Goal: Ability to maintain clinical measurements within normal limits will improve Outcome: Progressing Goal: Will remain free from infection Outcome: Progressing Goal: Diagnostic test results will improve Outcome:  Progressing Goal: Respiratory complications will improve Outcome: Progressing Goal: Cardiovascular complication will be avoided Outcome: Progressing   Problem: Health Behavior/Discharge Planning: Goal: Ability to manage health-related needs will improve Outcome: Progressing   Problem: Activity: Goal: Risk for activity intolerance will decrease Outcome: Progressing   Problem: Coping: Goal: Level of anxiety will decrease Outcome: Progressing   Problem: Elimination: Goal: Will not experience complications related to bowel motility Outcome: Progressing Goal: Will not experience complications related to urinary retention Outcome: Progressing   Problem: Pain Managment: Goal: General experience of comfort will improve Outcome: Progressing   Problem: Safety: Goal: Ability to remain free from injury will improve Outcome: Progressing   Problem: Skin Integrity: Goal: Risk for impaired skin integrity will decrease Outcome: Progressing

## 2021-07-29 NOTE — Progress Notes (Signed)
Pts BP greater than [id number] systolic. On call provider for Internal medicine paged to inform, no PRN medications on MAR. Received order for amlodipine.

## 2021-07-30 ENCOUNTER — Encounter (HOSPITAL_COMMUNITY): Payer: Self-pay | Admitting: Cardiovascular Disease

## 2021-07-30 LAB — BASIC METABOLIC PANEL
Anion gap: 9 (ref 5–15)
BUN: 22 mg/dL — ABNORMAL HIGH (ref 6–20)
CO2: 27 mmol/L (ref 22–32)
Calcium: 9.3 mg/dL (ref 8.9–10.3)
Chloride: 105 mmol/L (ref 98–111)
Creatinine, Ser: 1.11 mg/dL (ref 0.61–1.24)
GFR, Estimated: >60 mL/min (ref >60)
Glucose, Bld: 111 mg/dL — ABNORMAL HIGH (ref 70–99)
Potassium: 4 mmol/L (ref 3.5–5.1)
Sodium: 141 mmol/L (ref 135–145)

## 2021-07-30 LAB — T3, FREE: T3, Free: 1.3 pg/mL — ABNORMAL LOW (ref 2.0–4.4)

## 2021-07-30 MED ORDER — AMLODIPINE BESYLATE 10 MG PO TABS
10.0000 mg | ORAL_TABLET | Freq: Every day | ORAL | Status: DC
  Administered 2021-07-30 – 2021-09-11 (×44): 10 mg via ORAL
  Filled 2021-07-30 (×44): qty 1

## 2021-07-30 MED ORDER — CYCLOBENZAPRINE HCL 10 MG PO TABS
5.0000 mg | ORAL_TABLET | Freq: Three times a day (TID) | ORAL | Status: DC | PRN
  Administered 2021-07-30 – 2021-07-31 (×2): 5 mg via ORAL
  Filled 2021-07-30 (×2): qty 1

## 2021-07-30 NOTE — Progress Notes (Addendum)
Progress Note  Pt able to safely sit on the [facility] without direct supervision of nursing/staff for over 30 min.    07/30/21 1317  PT Visit Information  Last PT Received On 07/30/21  Assistance Needed +2  History of Present Illness 56 y.o. male presents to [facility] hospital on 07/25/2021 with numbness and tingling on left side of face along with L sided weakness. 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 PMH inlcudes HTN, previous stroke,  and recent TIA.  Subjective Data  Patient Stated Goal to regain strength and independence  Precautions  Precautions Fall  Precaution Comments L hemiparesis  Pain Assessment  Pain Assessment No/denies pain  Cognition  Arousal/Alertness Awake/alert  Behavior During Therapy WFL for tasks assessed/performed;Impulsive  Overall Cognitive Status Impaired/Different from baseline  Area of Impairment Safety/judgement  Following Commands Follows one step commands with increased time  Safety/Judgement Decreased awareness of deficits;Decreased awareness of safety  General Comments follows simple commands, impulsive, increased time to process, limited insight  Bed Mobility  General bed mobility comments did not return to the bed.  Transfers  Overall transfer level Needs assistance  Transfers Sit to/from Stand;Bed to chair/wheelchair/BSC  Sit to Stand Mod assist  Bed to/from chair/wheelchair/BSC transfer type: Squat pivot  Squat pivot transfers Mod assist;+2 safety/equipment  General transfer comment BSC to recliner, squat pivot and VC for sequencing for safest method. mod assist for transfer control.  Ambulation/Gait  General Gait Details not ready yet.  Modified Rankin (Stroke Patients Only)  Modified Rankin 5  Balance  Sitting balance-[name] Scale Poor  Standing balance-[name] Scale Poor  Standing balance comment stood for 30 secs at [facility] for peri- care.  General Comments  General  comments (skin integrity, edema, etc.) vss  PT - End of Session  Activity Tolerance Patient tolerated treatment well  Patient left in chair;with call bell/phone within reach;with chair alarm set  Nurse Communication Mobility status   PT - Assessment/Plan  PT Plan Discharge plan needs to be updated;Frequency needs to be updated  PT Visit Diagnosis Other abnormalities of gait and mobility (R26.89);Muscle weakness (generalized) (M62.81);Other symptoms and signs involving the nervous system (R29.898)  PT Frequency (ACUTE ONLY) Min 3X/week  Follow Up Recommendations SNF (denide by AIR due to no consistent family assist)  Assistance recommended at discharge Frequent or constant Supervision/Assistance  Patient can return home with the following A lot of help with walking and/or transfers;A lot of help with bathing/dressing/bathroom;Assistance with cooking/housework;Assistance with feeding;Direct supervision/assist for medications management;Direct supervision/assist for financial management;Assist for transportation;Help with stairs or ramp for entrance  PT equipment Wheelchair (measurements PT);Wheelchair cushion (measurements PT);Hospital bed  AM-PAC PT "6 Clicks" Mobility Outcome Measure (Version 2)  Help needed turning from your back to your side while in a flat bed without using bedrails? 2  Help needed moving from lying on your back to sitting on the side of a flat bed without using bedrails? 2  Help needed moving to and from a bed to a chair (including a wheelchair)? 2  Help needed standing up from a chair using your arms (e.g., wheelchair or bedside chair)? 2  Help needed to walk in hospital room? 1  Help needed climbing 3-5 steps with a railing?  1  6 Click Score 10  Consider Recommendation of Discharge To: CIR/SNF/LTACH  Progressive Mobility  What is the highest level of mobility based on the progressive mobility assessment? Level 3 (Stands with assist) - Balance while  standing  and cannot  march in place  Activity Transferred to/from [facility]  PT Goal Progression  Progress towards PT goals Progressing toward goals  Acute Rehab PT Goals  PT Goal Formulation With patient  Time For Goal Achievement 08/10/21  Potential to Achieve Goals Good  PT Time Calculation  PT Start Time (ACUTE ONLY) 1125  PT Stop Time (ACUTE ONLY) 1134  PT Time Calculation (min) (ACUTE ONLY) 9 min  PT General Charges  $$ ACUTE PT VISIT 1 Visit  PT Treatments  $Therapeutic Activity 8-22 mins    07/30/2021  Jacinto Halim., PT Acute Rehabilitation Services [phone]  (pager) [phone]  (office)

## 2021-07-30 NOTE — Progress Notes (Addendum)
Inpatient Rehabilitation Admissions Coordinator   I met with patient at bedside to clarify caregiver supports at home. He states his Mom is 58 years old and unable to assist him. His Daughter, Nira Conn, is in jail for the past 2 days with expectations to be there for 2 months. He has no caregiver supports, therefore CIR not a rehab option. He is not expected to get independent in an expected Length of Stay at [facility]. He will need SNF. I have alerted acute team and TOC. We will sign off at this time.  Danne Baxter, RN, MSN Rehab Admissions Coordinator [phone] 07/30/2021 12:32 PM

## 2021-07-30 NOTE — Progress Notes (Signed)
Physical Therapy Treatment Patient Details Name: Jason Figueroa MRN: [id number] DOB: 1965-10-11 Today's Date: 07/30/2021   History of Present Illness 56 y.o. male presents to [facility] hospital on 07/25/2021 with numbness and tingling on left side of face along with L sided weakness. 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 PMH inlcudes HTN, previous stroke,  and recent TIA.    PT Comments    Today's session emphasizing awareness of body position generally and L side specifically, transition to EOB, sitting balance, standing balance for extended period of time each trial x3, squat pivot transfer to the [facility] and balance work intended to provide pt with ability to sit safely on the [facility] without direct supervision.   Recommendations for follow up therapy are one component of a multi-disciplinary discharge planning process, led by the attending physician.  Recommendations may be updated based on patient status, additional functional criteria and insurance authorization.  Follow Up Recommendations  Acute inpatient rehab (3hours/day)     Assistance Recommended at Discharge Frequent or constant Supervision/Assistance  Patient can return home with the following A lot of help with walking and/or transfers;A lot of help with bathing/dressing/bathroom;Assistance with cooking/housework;Assistance with feeding;Direct supervision/assist for medications management;Direct supervision/assist for financial management;Assist for transportation;Help with stairs or ramp for entrance   Equipment Recommendations  Wheelchair (measurements PT);Wheelchair cushion (measurements PT);Hospital bed    Recommendations for Other Services Rehab consult     Precautions / Restrictions Precautions Precautions: Fall Precaution Comments: L hemiparesis Restrictions Weight Bearing Restrictions: No     Mobility  Bed Mobility Overal bed  mobility: Needs Assistance Bed Mobility: Rolling, Sidelying to Sit Rolling: Mod assist Sidelying to sit: Mod assist       General bed mobility comments: cues for normalized movement, assist roll L to right and up via R elbow.    Transfers Overall transfer level: Needs assistance Equipment used: 1 person hand held assist (chair back) Transfers: Sit to/from Stand, Bed to chair/wheelchair/BSC Sit to Stand: Mod assist     Squat pivot transfers: Mod assist, +2 physical assistance     General transfer comment: cues for safe technique, assist forward and boost,  stability assist during squat pivot.    Ambulation/Gait               General Gait Details: not ready yet.   Stairs             Wheelchair Mobility    Modified Rankin (Stroke Patients Only) Modified Rankin (Stroke Patients Only) Pre-Morbid Rankin Score: No symptoms Modified Rankin: Severe disability     Balance Overall balance assessment: Needs assistance Sitting-balance support: Single extremity supported, Feet supported Sitting balance-[name] Scale: Poor Sitting balance - Comments: mod to min guard over 5-8 min working on truncal activation and w/shifting/holding to the R   Standing balance support: Bilateral upper extremity supported, Reliant on assistive device for balance Standing balance-[name] Scale: Poor Standing balance comment: worked in stance for 2-3 min each of 3 trials on midline orientaton, w/shifts/holds, upright stance.                            Cognition Arousal/Alertness: Awake/alert Behavior During Therapy: WFL for tasks assessed/performed, Impulsive Overall Cognitive Status: Impaired/Different from baseline Area of Impairment: Safety/judgement                       Following Commands: Follows one  step commands with increased time Safety/Judgement: Decreased awareness of deficits, Decreased awareness of safety     General Comments: follows simple  commands, impulsive, increased time to process, limited insight        Exercises      General Comments General comments (skin integrity, edema, etc.): VSS      Pertinent Vitals/Pain Pain Assessment Pain Assessment: No/denies pain    Home Living                          Prior Function            PT Goals (current goals can now be found in the care plan section) Acute Rehab PT Goals Patient Stated Goal: to regain strength and independence PT Goal Formulation: With patient Time For Goal Achievement: 08/10/21 Potential to Achieve Goals: Good Progress towards PT goals: Progressing toward goals    Frequency    Min 4X/week      PT Plan      Co-evaluation              AM-PAC PT "6 Clicks" Mobility   Outcome Measure  Help needed turning from your back to your side while in a flat bed without using bedrails?: A Lot Help needed moving from lying on your back to sitting on the side of a flat bed without using bedrails?: A Lot Help needed moving to and from a bed to a chair (including a wheelchair)?: A Lot Help needed standing up from a chair using your arms (e.g., wheelchair or bedside chair)?: A Lot Help needed to walk in hospital room?: Total Help needed climbing 3-5 steps with a railing? : Total 6 Click Score: 10    End of Session   Activity Tolerance: Patient tolerated treatment well Patient left: Other (comment);with call bell/phone within reach (on [facility]) Nurse Communication: Mobility status;Other (comment) (lift or stedy) PT Visit Diagnosis: Other abnormalities of gait and mobility (R26.89);Muscle weakness (generalized) (M62.81);Other symptoms and signs involving the nervous system (R29.898)     Time: 5170-0174 PT Time Calculation (min) (ACUTE ONLY): 37 min  Charges:  $Therapeutic Activity: 8-22 mins $Neuromuscular Re-education: 8-22 mins                     07/30/2021  Jacinto Halim., PT Acute Rehabilitation Services [phone]   (pager) [phone]  (office)   Jason Figueroa 07/30/2021, 11:07 AM

## 2021-07-30 NOTE — Plan of Care (Signed)
°  Problem: Education: Goal: Knowledge of disease or condition will improve Outcome: Progressing Goal: Knowledge of secondary prevention will improve (SELECT ALL) Outcome: Progressing Goal: Knowledge of patient specific risk factors will improve (INDIVIDUALIZE FOR PATIENT) Outcome: Progressing Goal: Individualized Educational Video(s) Outcome: Progressing   Problem: Coping: Goal: Will verbalize positive feelings about self Outcome: Progressing Goal: Will identify appropriate support needs Outcome: Progressing   Problem: Health Behavior/Discharge Planning: Goal: Ability to manage health-related needs will improve Outcome: Progressing   Problem: Pain Managment: Goal: General experience of comfort will improve Outcome: Progressing   Problem: Elimination: Goal: Will not experience complications related to bowel motility Outcome: Progressing Goal: Will not experience complications related to urinary retention Outcome: Progressing   Problem: Skin Integrity: Goal: Risk for impaired skin integrity will decrease Outcome: Progressing

## 2021-07-30 NOTE — Progress Notes (Signed)
Pt stated he has been trying to have bowel movement but not successful pt has not had BM in greater than 3 days. On call provider paged to inform. Received order for miralax and prn senokot-S. Pt also verbalized he was having muscle spasms all over and the baclofen he received at 1846 was not effective. Provider informed. Received order for flexeril. Medications administered per order.

## 2021-07-30 NOTE — Progress Notes (Addendum)
° °  Subjective Had a panic attack last night that resolved with deep breaths.   Patient was seen at bedside during rounds. He is eating his breakfast. He is frustrated that his TV is not working. He is requesting something for his muscle spasms.   Pt is updated on the plan for today, and all questions and concerns are addressed.   Objective:  Vital signs in last 24 hours: Vitals:   07/30/21 0303 07/30/21 0800 07/30/21 0854 07/30/21 1138  BP:  (!) 139/100 (!) 145/91 (!) 143/66  Pulse: (!) 51  (!) 59 (!) 51  Resp:   12 20  Temp: 97.8 F (36.6 C)  98 F (36.7 C) 98 F (36.7 C)  TempSrc:  Oral Oral Oral  SpO2: 93%  99% 97%  Weight:      Height:       Constitutional: alert, well-appearing, in NAD  Cardiovascular: RRR, no m/r/g, non-edematous bilateral LE Pulmonary/Chest: normal work of breathing on room air, LCTAB Abdominal: soft, non-tender to palpation, non-distended MSK: normal bulk and tone, R>L Neurological: A&O x 3, follows commands. Ongoing left facial droop, dysarthria and left hemiplegia, with some improved L leg weakness.   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 30 day cardiac event monitoring at D/C. Some improvement in left LE strength today. BP still elevated 150/90. Stable for D/C pending SNF placement.  - Neuro signed off; follow up with Dr Pearlean Brownie in clinic in 4 weeks  - Not candidate for PFO closure; OP follow up w/ Dr Excell Seltzer.  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 Free T4 improved to 0.41 wiht IV Synthroid. Improved lethargy and bradycardia. Patient was transitioned to oral Synthroid -Continue with PO synthroid 100 mcg daily - Recheck TSH and free T4 in 4-6 weeks  Chronic pain  Muscle spasm  Complaining of lower leg muscle spasms since his stroke. Have tried Baclofen and Flexeril with minimal improvement. Spoke with stroke team, advised to increase and baclofen dose and to schedule it.  - Continue gabapentin 300mg  tid  - Increase Baclofen to 40 tid scheduled     Best Practice: Diet: NPO IVF: None,None VTE: Levenox 40 Code: Full   , MD  Internal Medicine Resident, PGY-1 Pager: [phone] After 5pm on weekdays and 1pm on weekends: On Call pager [phone]

## 2021-07-31 ENCOUNTER — Other Ambulatory Visit: Payer: Self-pay | Admitting: Student

## 2021-07-31 DIAGNOSIS — I6340 Cerebral infarction due to embolism of unspecified cerebral artery: Secondary | ICD-10-CM

## 2021-07-31 LAB — BASIC METABOLIC PANEL
Anion gap: 8 (ref 5–15)
BUN: 24 mg/dL — ABNORMAL HIGH (ref 6–20)
CO2: 28 mmol/L (ref 22–32)
Calcium: 9.3 mg/dL (ref 8.9–10.3)
Chloride: 104 mmol/L (ref 98–111)
Creatinine, Ser: 1.19 mg/dL (ref 0.61–1.24)
GFR, Estimated: >60 mL/min (ref >60)
Glucose, Bld: 113 mg/dL — ABNORMAL HIGH (ref 70–99)
Potassium: 3.8 mmol/L (ref 3.5–5.1)
Sodium: 140 mmol/L (ref 135–145)

## 2021-07-31 MED ORDER — BACLOFEN 10 MG PO TABS
40.0000 mg | ORAL_TABLET | Freq: Three times a day (TID) | ORAL | Status: DC
  Administered 2021-07-31 – 2021-08-01 (×5): 40 mg via ORAL
  Filled 2021-07-31 (×5): qty 4

## 2021-07-31 MED ORDER — BACLOFEN 10 MG PO TABS: 40.0000 mg | ORAL_TABLET | Freq: Three times a day (TID) | ORAL | Status: DC | PRN

## 2021-07-31 NOTE — Progress Notes (Signed)
Outpatient Event Monitor ordered for further evaluation of stroke. Patient has never been formally seen by our group so New Patient Visit will also be arranged to review results.  Corrin Parker, PA-C 07/31/2021 2:46 PM

## 2021-07-31 NOTE — Plan of Care (Signed)
Pt is alert oriented x 4. Pt has been agitated, irrtitable. Pt states he wants to be left alone. Pt noted crying out loud. Pt asked if he was okay, he stated he felt like someone was dying. He stated his mother or cousin died. He stated he doesn't know what's going on but he can feel it. RN asked if he would like to call his mother and check on her and he said no. Pt offered tissues and emotional support. Pt continues to cry.   Problem: Education: Goal: Knowledge of disease or condition will improve Outcome: Progressing Goal: Knowledge of secondary prevention will improve (SELECT ALL) Outcome: Progressing Goal: Knowledge of patient specific risk factors will improve (INDIVIDUALIZE FOR PATIENT) Outcome: Progressing Goal: Individualized Educational Video(s) Outcome: Progressing   Problem: Coping: Goal: Will verbalize positive feelings about self Outcome: Progressing Goal: Will identify appropriate support needs Outcome: Progressing   Problem: Health Behavior/Discharge Planning: Goal: Ability to manage health-related needs will improve Outcome: Progressing   Problem: Self-Care: Goal: Ability to participate in self-care as condition permits will improve Outcome: Progressing Goal: Verbalization of feelings and concerns over difficulty with self-care will improve Outcome: Progressing Goal: Ability to communicate needs accurately will improve Outcome: Progressing   Problem: Nutrition: Goal: Risk of aspiration will decrease Outcome: Progressing   Problem: Ischemic Stroke/TIA Tissue Perfusion: Goal: Complications of ischemic stroke/TIA will be minimized Outcome: Progressing   Problem: Education: Goal: Knowledge of General Education information will improve Description: Including pain rating scale, medication(s)/side effects and non-pharmacologic comfort measures Outcome: Progressing   Problem: Health Behavior/Discharge Planning: Goal: Ability to manage health-related needs will  improve Outcome: Progressing   Problem: Clinical Measurements: Goal: Ability to maintain clinical measurements within normal limits will improve Outcome: Progressing Goal: Will remain free from infection Outcome: Progressing Goal: Diagnostic test results will improve Outcome: Progressing Goal: Respiratory complications will improve Outcome: Progressing Goal: Cardiovascular complication will be avoided Outcome: Progressing   Problem: Activity: Goal: Risk for activity intolerance will decrease Outcome: Progressing   Problem: Coping: Goal: Level of anxiety will decrease Outcome: Progressing   Problem: Elimination: Goal: Will not experience complications related to bowel motility Outcome: Progressing Goal: Will not experience complications related to urinary retention Outcome: Progressing   Problem: Pain Managment: Goal: General experience of comfort will improve Outcome: Progressing   Problem: Safety: Goal: Ability to remain free from injury will improve Outcome: Progressing   Problem: Skin Integrity: Goal: Risk for impaired skin integrity will decrease Outcome: Progressing

## 2021-07-31 NOTE — Progress Notes (Signed)
Pts mother spoke with pt on the phone.  Pt noted taking deep breaths and trying to calm down. Pt continued to cry. Pt eventually fell asleep. No distress noted. On call provider informed of pts status. Also informed provider of patient being agitated and irritable on current and previous shifts.

## 2021-07-31 NOTE — TOC Initial Note (Signed)
Transition of Care [facility]) - Initial/Assessment Note    Patient Details  Name: Jason Figueroa MRN: [id number] Date of Birth: April 05, 1966  Transition of Care [facility]) CM/SW Contact:    Baldemar Lenis, LCSW Phone Number: 07/31/2021, 3:14 PM  Clinical Narrative:        CSW alerted by rehab admissions that patient will need SNF. Patient has no payor source. CSW contacted financial counseling to have patient screened for Medicaid and Disability to qualify for LOG placement. Patient has been faxed out for SNF, pending bed offers. CSW discussed with MD barrier to placement, including no insurance. No bed offers at this time. CSW to follow.           Expected Discharge Plan: Skilled Nursing Facility Barriers to Discharge: Inadequate or no insurance, SNF Pending bed offer, SNF Pending payor source - LOG, SNF Pending Medicaid   Patient Goals and CMS Choice Patient states their goals for this hospitalization and ongoing recovery are:: to get better CMS Medicare.gov Compare Post Acute Care list provided to:: Patient Choice offered to / list presented to : Patient  Expected Discharge Plan and Services Expected Discharge Plan: Skilled Nursing Facility     Post Acute Care Choice: Skilled Nursing Facility Living arrangements for the past 2 months: Single Family Home                                      Prior Living Arrangements/Services Living arrangements for the past 2 months: Single Family Home Lives with:: Parents Patient language and need for interpreter reviewed:: No Do you feel safe going back to the place where you live?: Yes      Need for Family Participation in Patient Care: No (Comment) Care giver support system in place?: No (comment)   Criminal Activity/Legal Involvement Pertinent to Current Situation/Hospitalization: No - Comment as needed  Activities of Daily Living Home Assistive Devices/Equipment: Blood pressure cuff, Eyeglasses, Walker (specify type),  Wheelchair, Medical laboratory scientific officer (specify quad or straight) ADL Screening (condition at time of admission) Patient's cognitive ability adequate to safely complete daily activities?: Yes Is the patient deaf or have difficulty hearing?: No Does the patient have difficulty seeing, even when wearing glasses/contacts?: No Does the patient have difficulty concentrating, remembering, or making decisions?: No Patient able to express need for assistance with ADLs?: Yes Does the patient have difficulty dressing or bathing?: Yes Independently performs ADLs?: No Communication: Independent Dressing (OT): Needs assistance Is this a change from baseline?: Change from baseline, expected to last >3 days Grooming: Needs assistance Is this a change from baseline?: Change from baseline, expected to last >3 days Feeding: Needs assistance Is this a change from baseline?: Change from baseline, expected to last >3 days Bathing: Needs assistance Is this a change from baseline?: Change from baseline, expected to last >3 days Toileting: Needs assistance Is this a change from baseline?: Change from baseline, expected to last >3days In/Out Bed: Needs assistance Is this a change from baseline?: Change from baseline, expected to last >3 days Walks in Home: Independent Does the patient have difficulty walking or climbing stairs?: Yes Weakness of Legs: Left Weakness of Arms/Hands: Left  Permission Sought/Granted Permission sought to share information with : Facility Industrial/product designer granted to share information with : Yes, Verbal Permission Granted     Permission granted to share info w AGENCY: SNF        Emotional Assessment Appearance:: Appears stated  age Attitude/Demeanor/Rapport: Engaged Affect (typically observed): Appropriate Orientation: : Oriented to Self, Oriented to Place, Oriented to  Time, Oriented to Situation Alcohol / Substance Use: Not Applicable Psych Involvement: No (comment)  Admission  diagnosis:  Stroke [facility]) [I63.9] Cerebrovascular accident (CVA), unspecified mechanism (HCC) [I63.9] Patient Active Problem List   Diagnosis Date Noted   Left-sided weakness 07/25/2021   Stroke (HCC) 07/25/2021   PCP:  Pcp, No Pharmacy:  No Pharmacies Listed    Social Determinants of Health (SDOH) Interventions    Readmission Risk Interventions No flowsheet data found.

## 2021-07-31 NOTE — Progress Notes (Signed)
Physical Therapy Treatment Patient Details Name: Jason Figueroa MRN: [id number] DOB: [birth year] Today's Date: 07/31/2021   History of Present Illness 56 y.o. male [facility] hospital on 07/25/2021 with numbness and tingling on left side of face along with L sided weakness. 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 PMH inlcudes HTN, previous stroke,  and recent TIA.    PT Comments    Pt making gradual progress.  Remains 0/5 strength on L side but some improvement in EOB balance and extended time spent working on standing in STEDY with weight shifting.  Therapist providing multimodal cues and facilitation throughout transfers.  Continue plan of care.     Recommendations for follow up therapy are one component of a multi-disciplinary discharge planning process, led by the attending physician.  Recommendations may be updated based on patient status, additional functional criteria and insurance authorization.  Follow Up Recommendations  Skilled nursing-short term rehab (<3 hours/day) (due to denied by AIR)     Assistance Recommended at Discharge Frequent or constant Supervision/Assistance  Patient can return home with the following A lot of help with walking and/or transfers;A lot of help with bathing/dressing/bathroom;Assistance with cooking/housework;Assistance with feeding;Direct supervision/assist for medications management;Direct supervision/assist for financial management;Assist for transportation;Help with stairs or ramp for entrance   Equipment Recommendations  Wheelchair (measurements PT);Wheelchair cushion (measurements PT);Hospital bed    Recommendations for Other Services Rehab consult     Precautions / Restrictions Precautions Precautions: Fall Precaution Comments: L hemiparesis     Mobility  Bed Mobility Overal bed mobility: Needs Assistance Bed Mobility: Rolling, Sidelying to  Sit Rolling: Min assist Sidelying to sit: Mod assist       General bed mobility comments: Rolled to R then using R UE to push up but requring mod A and mod A to scoot forward    Transfers Overall transfer level: Needs assistance Equipment used: Ambulation equipment used (STEDY) Transfers: Sit to/from Stand Sit to Stand: +2 safety/equipment, Min assist           General transfer comment: Only requiring min A to stand but into STEDY and using grab bar with heavy dependency on R side.  Performed 8 stands during session.  Therapist and tech helping to position L arm and L LE.    Ambulation/Gait               General Gait Details: not ready yet.   Stairs             Wheelchair Mobility    Modified Rankin (Stroke Patients Only) Modified Rankin (Stroke Patients Only) Pre-Morbid Rankin Score: No symptoms Modified Rankin: Severe disability     Balance Overall balance assessment: Needs assistance Sitting-balance support: Single extremity supported, Feet supported Sitting balance-[name] Scale: Poor Sitting balance - Comments: Initial mod A, cued to use rail on R and progressed to min guard.  As long as he had rail on R could maintain balance. Progressed to trying to balance without R UE.  Therapist supporting L hand and elbow for weight bearing with pt attempting truncal activation and weight shifting - tech present to assist and guard.  At times pt could maintain with min A but requiring mod A frequently and cues to focus and lean R to maintain. EOB for 8 mins Postural control: Left lateral lean Standing balance support: Bilateral upper extremity supported, Reliant on assistive device for balance Standing balance-[name] Scale: Poor Standing balance comment: Stood 6  times into STEDY for 30 sec-50min each.  Pt holding with R UE and requiring assist to maintain L UE on grab bar.  Pt standing upright well, worked on weight shifting onto L leg with knee bends and straightening  l knee (therapist assisting, no quad activation).  THerapist provided facilitation with muscle taps and physical assist. Tech providing guarding as needed.                            Cognition Arousal/Alertness: Awake/alert Behavior During Therapy: Impulsive (mild impulsive) Overall Cognitive Status: Impaired/Different from baseline                         Following Commands: Follows one step commands consistently Safety/Judgement: Decreased awareness of deficits, Decreased awareness of safety     General Comments: Following 1 step commands without difficulty; some cues to focus on task at hand; mild L inattention (does tend to look R but can look L, aware L side weak, and aware he is leaning L)        Exercises Other Exercises Other Exercises: PROM with muscle tapping for facilitation x 10: quad sets, ankle pumps, and heel slides    General Comments General comments (skin integrity, edema, etc.): Educated on neuroplasticity - trying to retrain brain, importance of weight bearing into extremities for recovery, and thinking about the movements even if muscles arent contracting      Pertinent Vitals/Pain Pain Assessment Pain Assessment: No/denies pain    Home Living                          Prior Function            PT Goals (current goals can now be found in the care plan section) Progress towards PT goals: Progressing toward goals    Frequency    Min 3X/week      PT Plan Current plan remains appropriate    Co-evaluation              AM-PAC PT "6 Clicks" Mobility   Outcome Measure  Help needed turning from your back to your side while in a flat bed without using bedrails?: A Lot Help needed moving from lying on your back to sitting on the side of a flat bed without using bedrails?: A Lot Help needed moving to and from a bed to a chair (including a wheelchair)?: Total Help needed standing up from a chair using your arms  (e.g., wheelchair or bedside chair)?: A Lot Help needed to walk in hospital room?: Total Help needed climbing 3-5 steps with a railing? : Total 6 Click Score: 9    End of Session Equipment Utilized During Treatment: Gait belt Activity Tolerance: Patient tolerated treatment well Patient left: in bed;with call bell/phone within reach;with bed alarm set;with family/visitor present Nurse Communication: Mobility status PT Visit Diagnosis: Other abnormalities of gait and mobility (R26.89);Muscle weakness (generalized) (M62.81);Other symptoms and signs involving the nervous system (R29.898)     Time: 2671-2458 PT Time Calculation (min) (ACUTE ONLY): 34 min  Charges:  $Neuromuscular Re-education: 23-37 mins                     Anise Salvo, PT Acute Rehab Services Pager [phone] [facility] [phone]    Rayetta Humphrey 07/31/2021, 3:08 PM

## 2021-07-31 NOTE — NC FL2 (Signed)
[place] MEDICAID FL2 LEVEL OF CARE SCREENING TOOL     IDENTIFICATION  Patient Name: JLON QUERRY Birthdate: 1966/06/09 Sex: male Admission Date (Current Location): 07/25/2021  [facility] and [place] Number:  Herbalist and Address:  [facility] [place], [place] [address], [place], [place] [address]      Provider Number: [id number]  Attending Physician Name and Address:  Aldine Contes, MD  Relative Name and Phone Number:       Current Level of Care: Hospital Recommended Level of Care: [place] Prior Approval Number:    Date Approved/Denied:   PASRR Number: [id number]  Discharge Plan: SNF    Current Diagnoses: Patient Active Problem List   Diagnosis Date Noted   Left-sided weakness 07/25/2021   Stroke ([place]) 07/25/2021    Orientation RESPIRATION BLADDER Height & Weight     Self, Time, Situation, Place  Normal Incontinent Weight: 194 lb 7.1 oz (88.2 kg) Height:  5\' 9"  (175.3 cm)  BEHAVIORAL SYMPTOMS/MOOD NEUROLOGICAL BOWEL NUTRITION STATUS      Continent Diet (regular)  AMBULATORY STATUS COMMUNICATION OF NEEDS Skin   Extensive Assist Verbally Normal                       Personal Care Assistance Level of Assistance  Bathing, Feeding, Dressing Bathing Assistance: Maximum assistance Feeding assistance: Limited assistance Dressing Assistance: Maximum assistance     Functional Limitations Info  Sight Sight Info: Impaired        SPECIAL CARE FACTORS FREQUENCY  PT (By licensed PT), OT (By licensed OT), Speech therapy     PT Frequency: 5x/wk OT Frequency: 5x/wk     Speech Therapy Frequency: 5x/wk      Contractures Contractures Info: Not present    Additional Factors Info  Code Status, Allergies Code Status Info: Full Allergies Info: Ivp Dye (Iodinated Contrast Media)           Current Medications (07/31/2021):  This is the current hospital active medication list Current Facility-Administered  Medications  Medication Dose Route Frequency Provider Last Rate Last Admin   acetaminophen (TYLENOL) tablet 650 mg  650 mg Oral Q6H PRN Skeet Latch, MD   650 mg at 07/31/21 0540   Or   acetaminophen (TYLENOL) suppository 650 mg  650 mg Rectal Q6H PRN Skeet Latch, MD       amLODipine (NORVASC) tablet 10 mg  10 mg Oral Daily Lajean Manes, MD   10 mg at 07/31/21 [id number]   aspirin chewable tablet 81 mg  81 mg Oral Daily Skeet Latch, MD   81 mg at 07/31/21 0815   atorvastatin (LIPITOR) tablet 80 mg  80 mg Oral Daily Skeet Latch, MD   80 mg at 07/31/21 0816   baclofen (LIORESAL) tablet 40 mg  40 mg Oral TID Lajean Manes, MD   40 mg at 07/31/21 1220   clopidogrel (PLAVIX) tablet 75 mg  75 mg Oral Daily Skeet Latch, MD   75 mg at 07/31/21 0815   enoxaparin (LOVENOX) injection 40 mg  40 mg Subcutaneous Q24H Skeet Latch, MD   40 mg at 07/30/21 1626   gabapentin (NEURONTIN) capsule 300 mg  300 mg Oral TID Skeet Latch, MD   300 mg at 07/31/21 0816   levothyroxine (SYNTHROID) tablet 100 mcg  100 mcg Oral Q0600 Rick Duff, MD   100 mcg at 07/31/21 0540   lisinopril (ZESTRIL) tablet 40 mg  40 mg Oral Daily Lajean Manes, MD  40 mg at 07/31/21 0815   polyethylene glycol (MIRALAX / GLYCOLAX) packet 17 g  17 g Oral Daily Wayland Denis, MD   17 g at 07/31/21 0817   senna-docusate (Senokot-S) tablet 1 tablet  1 tablet Oral QHS PRN Wayland Denis, MD   1 tablet at 07/30/21 2208     Discharge Medications: Please see discharge summary for a list of discharge medications.  Relevant Imaging Results:  Relevant Lab Results:   Additional Information SS#: [id number]  Geralynn Ochs, LCSW

## 2021-08-01 ENCOUNTER — Inpatient Hospital Stay (HOSPITAL_COMMUNITY): Payer: Medicaid Other

## 2021-08-01 LAB — BASIC METABOLIC PANEL
Anion gap: 11 (ref 5–15)
BUN: 24 mg/dL — ABNORMAL HIGH (ref 6–20)
CO2: 25 mmol/L (ref 22–32)
Calcium: 9.5 mg/dL (ref 8.9–10.3)
Chloride: 104 mmol/L (ref 98–111)
Creatinine, Ser: 1.14 mg/dL (ref 0.61–1.24)
GFR, Estimated: >60 mL/min (ref >60)
Glucose, Bld: 112 mg/dL — ABNORMAL HIGH (ref 70–99)
Potassium: 3.8 mmol/L (ref 3.5–5.1)
Sodium: 140 mmol/L (ref 135–145)

## 2021-08-01 LAB — GLUCOSE, CAPILLARY: Glucose-Capillary: 119 mg/dL — ABNORMAL HIGH (ref 70–99)

## 2021-08-01 MED ORDER — HYDROCHLOROTHIAZIDE 12.5 MG PO TABS
12.5000 mg | ORAL_TABLET | Freq: Two times a day (BID) | ORAL | Status: DC
  Administered 2021-08-01 – 2021-08-04 (×6): 12.5 mg via ORAL
  Filled 2021-08-01 (×6): qty 1

## 2021-08-01 NOTE — Progress Notes (Signed)
This provider was paged to bedside due to concerns regarding altered mentation including repetitive speech and worsening aphasia from baseline.  RN reported that family also raised concerns that he seemed more confused and was having difficulty with spatial awareness that he had not previously had.  Mental Status: Patient is awake, alert, oriented to location and self.  When asked other orientation questions, patient repeated "I am just trying to swallow my pills"or "2003."  Responses were delayed.  Patient lacked comprehension of simple tasks.  Cranial Nerves: Asked patient to complete test to assess cranial nerves however he did not physically do them despite stating that he believed he was.  Facial droop on the right. Motor: Left upper and lower extremities are flaccid.  Unable to fully assess right upper and lower extremity strength due to altered mentation.  Patient could lift and hold R upper and lower extremities but would drop them after 1 second. Sensory: Unable to assess due to altered mentation. Cerebellar: Unable to assess due to altered mentation.  Assessment: Code stroke was called and CTA head and neck were obtained.  These did not show changes from previous imaging. Plan: Continue to monitor neurological status.  BMP pending at this time.  Champ Mungo, DO Internal Medicine PGY-1

## 2021-08-01 NOTE — Progress Notes (Addendum)
° °  Subjective ON: On call team paged around 6pm d/t altered mentation including repetitive speech and worsening aphasia from baseline. Pt oriented to person and place but not time. Code stroke called. Repeat CT imaging did not show changes from previous imaging. Neurology concerned for recurrent stoke; recommended MRI, MRA head and neck and EEG. Gabapentin and baclofen were held. BMET was stable. Stroke team to follow.   Pt was seen at bedside during rounds today. He is slightly less alert and does appear less interactive than usual. He tells me that his mom and sister visited yesterday and hopes they visit again.   Objective:  Vital signs in last 24 hours: Vitals:   08/01/21 0335 08/01/21 0747 08/01/21 1125 08/01/21 1520  BP: (!) 178/92 (!) 183/89 (!) 158/98 (!) 144/75  Pulse: (!) 59 (!) 53 (!) 57 67  Resp: 17 12 20 12   Temp: 98.3 F (36.8 C) 97.7 F (36.5 C) 98.2 F (36.8 C) 98.1 F (36.7 C)  TempSrc: Oral Oral Oral Oral  SpO2: 100% 99% 99% 99%  Weight:      Height:       Constitutional: alert, well-appearing, in NAD  Cardiovascular: RRR, no m/r/g, non-edematous bilateral LE Pulmonary/Chest: normal work of breathing on room air, LCTAB Abdominal: soft, non-tender to palpation, non-distended MSK: normal bulk and tone, R>L Neurological: A&O x 3, follows commands. Ongoing left facial droop, dysarthria and 0/5 drift of left upper extremity and left lower extremity.    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 Worsening confusion yesterday evening; code stroke called, CT imaging reassuring with no hemorrhage. MRI/MRA and EEG pending, and stroke team to follow. Pt is awake, alert, cracking jokes, and able to answer questions on my exam this AM. He does appear less interactive  however, and repeats "I'm just joking with you" before attempting to answer questions. - Stroke team to follow   - MRI, MRA head and neck pending  - EEG pending  - 30 day cardiac monitoring ordered   - Continue Aspirin 81mg  daily - Continue Clopidogrel 75mg  daily for 3 weeks - Continue Atorvastatin 80 mg  - Tele monitoring  - PT/OT   - Denied CIR; SW assisting with SNF placement - Outpatient sleep study recommended  - Follow up in clinic with Dr Leonie Man in 4 weeks   HTN Goal is normotension. BP just slightly improved with addition of HCTZ yesterday. Will avoid dropping BP too fast given concern for possible new stroke.  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 Free T4 improved to 0.41 wiht IV Synthroid. Improved lethargy and bradycardia modestly improved (50s this AM) - Continue with PO synthroid 149mcg   - Recheck TSH and free T4 in 4-6 weeks  Chronic pain  Muscle spasm  Lower leg muscle spasms since his stroke. Holding baclofen and gabapentin in s/o of this worsening confusion. No complaints this AM.  - Holding gabapentin 300mg  tid  - Holding Baclofen to 40 tid scheduled     Best Practice: Diet: Regular diet  IVF: None,None VTE: Levenox 40 Code: Full   Lajean Manes, MD  Internal Medicine Resident, PGY-1 [facility]  Pager: [phone]

## 2021-08-01 NOTE — Progress Notes (Signed)
° °  Subjective Patient had no acute events overnight.  He continues to have some frustration given his neurological deficits.  He has been able to eat and drink without any difficulty.  He continues to work with PT.  Objective:  Vital signs in last 24 hours: Vitals:   07/31/21 2330 08/01/21 0335 08/01/21 0747 08/01/21 1125  BP: 127/80 (!) 178/92 (!) 183/89 (!) 158/98  Pulse: (!) 52 (!) 59 (!) 53 (!) 57  Resp: 11 17 12 20   Temp: 98.1 F (36.7 C) 98.3 F (36.8 C) 97.7 F (36.5 C) 98.2 F (36.8 C)  TempSrc: Axillary Oral Oral Oral  SpO2: 95% 100% 99% 99%  Weight:      Height:       Constitutional: alert, well-appearing, in NAD  Cardiovascular: RRR, no m/r/g, non-edematous bilateral LE Pulmonary/Chest: normal work of breathing on room air, LCTAB Abdominal: soft, non-tender to palpation, non-distended MSK: normal bulk and tone, R>L Neurological: A&O x 3, follows commands. Ongoing left facial droop, dysarthria and 0/5 drift of left upper extremity and left lower extremity.     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 30 day cardiac event monitoring at D/C. Some improvement in left LE strength today. BP still elevated up to 190s systolic. Stable for D/C pending SNF placement.  - Neuro signed off; follow up with Dr Excell Seltzer in clinic in 4 weeks  - Not candidate for PFO closure; OP follow up w/ Dr Pearlean Brownie.  - 30 day cardiac monitoring ordered   - Aspirin 81mg  daily - Clopidogrel 75mg  daily for 3 weeks - Atorvastatin 80 mg  - Continue Lisinopril 40 mg  - Continue amlodipine 10 mg - Tele monitoring  - Outpatient sleep study recommended  - PT/OT   - Denied CIR; SW assisting with SNF placement  HTN:  Patient's goal blood pressure is now normotensive.  His blood pressures remain elevated.   His current antihypertensive regimen includes amlodipine 10 mg once daily as well as lisinopril 40 mg once daily.  Because his blood pressure is remaining elevated we will add a thiazide diuretic this a.m. 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 Free T4 improved to 0.41 wiht IV Synthroid. Improved lethargy and bradycardia modestly improved (50s this AM) - Continue with PO synthroid Excell Seltzer   - Recheck TSH and free T4 in 4-6 weeks  Chronic pain  Muscle spasm  Complaining of lower leg muscle spasms since his stroke. Have tried Baclofen and Flexeril with minimal improvement. Spoke with stroke team, advised to increase and baclofen dose and to schedule it. No complaints of spasm pain this AM.  - Continue gabapentin 300mg  tid  - Continue Baclofen to 40 tid scheduled     Best Practice: Diet: Regular diet  IVF: None,None VTE: Levenox 40 Code: Full   , MD PGY-2 Internal Medicine  Pager [phone]

## 2021-08-01 NOTE — Progress Notes (Signed)
Brief narrative: Patient is a 56 year old male admitted on 2/4 with multifocal strokes, including subcortical strokes in the right resulting in severe left-sided hemiparesis.  Tonight he developed worsening confusion.  Exam: Vitals:   08/01/21 1520 08/01/21 1939  BP: (!) 144/75 (!) 142/87  Pulse: 67 (!) 56  Resp: 12 17  Temp: 98.1 F (36.7 C) 98.1 F (36.7 C)  SpO2: 99% 96%   Gen: In bed, NAD Resp: non-labored breathing, no acute distress Abd: soft, nt  Neuro: MS: Awake, alert, able to answer questions, but appeared to have significant neglect, denies any left-sided weakness.  He did not however extinguish to double simultaneous stimulation CN: Pupils equal round and reactive, he has a right gaze preference, he is able to count in all visual fields, no clear extinguishing to visual stimuli Motor: 0/5 in the left arm and leg, though he is not fully cooperative with drift on the right he is able to move both the right arm and leg well. Sensory: Endorses symmetric sensation  Pertinent Labs: CBG 119  Impression: 56 year old male with recent multifocal strokes with worsening confusion.  Given the multifocal nature, as well as severe posterior circulation stenosis I am concerned that he may have had recurrent strokes.  CT head negative for any type of intracranial hemorrhage, he is allergic contrast, so we will do MRI/MRA.  Recommendations: 1) MRI, MRA head and neck 2) Routine EEG 3) continue aspirin and Plavix  4) stroke team to follow  Ritta Slot, MD [facility] [phone]  If 7pm- 7am, please page neurology on call as listed in AMION.

## 2021-08-02 ENCOUNTER — Inpatient Hospital Stay (HOSPITAL_COMMUNITY): Payer: Medicaid Other

## 2021-08-02 LAB — CBC
HCT: 46.1 % (ref 39.0–52.0)
Hemoglobin: 15.9 g/dL (ref 13.0–17.0)
MCH: 31.8 pg (ref 26.0–34.0)
MCHC: 34.5 g/dL (ref 30.0–36.0)
MCV: 92.2 fL (ref 80.0–100.0)
Platelets: 257 10*3/uL (ref 150–400)
RBC: 5 MIL/uL (ref 4.22–5.81)
RDW: 14.2 % (ref 11.5–15.5)
WBC: 10.1 10*3/uL (ref 4.0–10.5)
nRBC: 0 % (ref 0.0–0.2)

## 2021-08-02 LAB — BASIC METABOLIC PANEL
Anion gap: 12 (ref 5–15)
BUN: 28 mg/dL — ABNORMAL HIGH (ref 6–20)
CO2: 22 mmol/L (ref 22–32)
Calcium: 9.5 mg/dL (ref 8.9–10.3)
Chloride: 106 mmol/L (ref 98–111)
Creatinine, Ser: 1.1 mg/dL (ref 0.61–1.24)
GFR, Estimated: >60 mL/min (ref >60)
Glucose, Bld: 115 mg/dL — ABNORMAL HIGH (ref 70–99)
Potassium: 4.2 mmol/L (ref 3.5–5.1)
Sodium: 140 mmol/L (ref 135–145)

## 2021-08-02 NOTE — Progress Notes (Addendum)
STROKE TEAM PROGRESS NOTE   SUBJECTIVE (INTERVAL HISTORY) He is seen at the bedside with his mother and child's mother. Overall his condition is worsened. Patient with continued L sided weakness, now with L hemi-neglect, confusion, delusions, hallucinations and pseudobulbar affect. He has poor PO intake for the past 2 days and having delusions and paranoia about people being out to get him.   New CT Head obtained during code stroke yesterday showed no acute intracranial abnormalities and evolving of the multiple infarcts seen on prior imaging. MRI revealed an new small acute   cortical infarct at the R temporal operculum and continued to show the pre-existing infarcts and chronic small vessel ischemia with chronic lacunar infarcts. No changes on MRA.  Patient was started on baclofen and Neurontin on 07/31/2021 and mother feels symptoms began yesterday and was having inappropriate crying as well as confusion delusions and hallucinations.  He has not been febrile.  OBJECTIVE CBC:  No results for input(s): WBC, NEUTROABS, HGB, HCT, MCV, PLT in the last 168 hours.  Basic Metabolic Panel:  Recent Labs  Lab 07/31/21 0027 08/01/21 1932  NA 140 140  K 3.8 3.8  CL 104 104  CO2 28 25  GLUCOSE 113* 112*  BUN 24* 24*  CREATININE 1.19 1.14  CALCIUM 9.3 9.5   Lipid Panel:  No results for input(s): CHOL, TRIG, HDL, CHOLHDL, VLDL, LDLCALC in the last 168 hours.  HgbA1c:  No results for input(s): HGBA1C in the last 168 hours.  Urine Drug Screen:  No results for input(s): LABOPIA, COCAINSCRNUR, LABBENZ, AMPHETMU, THCU, LABBARB in the last 168 hours.   Alcohol Level No results for input(s): ETH in the last 168 hours.  IMAGING past 24 hours MR ANGIO HEAD WO CONTRAST  Result Date: 08/02/2021 CLINICAL DATA:  Stroke follow-up EXAM: MRI HEAD WITHOUT CONTRAST MRA HEAD WITHOUT CONTRAST MRA NECK WITHOUT CONTRAST TECHNIQUE: Multiplanar, multiecho pulse sequences of the brain and surrounding structures  were obtained without intravenous contrast. Angiographic images of the Circle of Willis were obtained using MRA technique without intravenous contrast. Angiographic images of the neck were obtained using MRA technique without intravenous contrast. Carotid stenosis measurements (when applicable) are obtained utilizing NASCET criteria, using the distal internal carotid diameter as the denominator. COMPARISON:  Same study from 8 days ago FINDINGS: MRI HEAD FINDINGS Brain: New cortically based infarct at the right temporal operculum. Continued restricted diffusion at the genu of the corpus callosum, at the right stratum and corona radiata, and weakly within the left corona radiata. Punctate restricted diffusion at the left anterior temporal cortex. The right corona radiata infarct is more confluent than before but in the same distribution. Confluent chronic small vessel ischemic gliosis in the periventricular white matter with chronic lacunes in the right pons and bilateral deep gray nuclei. Low cerebral volume for age, especially in the cerebellum. No chronic blood products, acute hemorrhage, hydrocephalus, or collection. Vascular: Arterial findings below.  Normal dural venous flow voids. 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 Superimposed atheromatous irregularity on the right more than left cavernous carotids with 50% narrowing at the right paraclinoid segment. Negative for branch occlusion, beading, or aneurysm. No proximal flow limiting stenosis. Fairly symmetric vertebral arteries in the posterior circulation. Atheromatous undulation of vertebral and basilar arteries with up to 50% narrowing at the proximal basilar. Advanced right P2 segment stenosis with flow gap. Bilateral  posterior cerebral branches show high-grade narrowing. Negative for aneurysm. MRA NECK FINDINGS  Unremarkable arch where covered. The left vertebral artery is either early branching or arises from the aorta. Antegrade flow in the carotid and vertebral arteries bilaterally. Mild plaque is likely at the carotid bifurcations. Where not distorted by motion, the vertebral arteries are smoothly contoured and widely patent. IMPRESSION: Brain MRI: 1. Small acute cortical infarct at the right temporal operculum. 2. Re-identified pre-existing infarcts. T he right basal ganglia infarct became more confluent but is in the same distribution. 3. Chronic small vessel ischemia with chronic lacunar infarcts. Chronic small left frontal cortex infarct. Intracranial MRA: 1. No interval or emergent findings. 2. Intracranial atherosclerosis with 50% narrowing of the right ICA and proximal basilar. 3. Advanced bilateral PCA stenoses, more proximal in the right where it affects the P2 segment. Neck MRA: Common artifact seen with noncontrast neck MRA. No evidence of stenosis or interval change. Electronically Signed   By: Tiburcio Pea M.D.   On: 08/02/2021 13:05   MR ANGIO NECK WO CONTRAST  Result Date: 08/02/2021 CLINICAL DATA:  Stroke follow-up EXAM: MRI HEAD WITHOUT CONTRAST MRA HEAD WITHOUT CONTRAST MRA NECK WITHOUT CONTRAST TECHNIQUE: Multiplanar, multiecho pulse sequences of the brain and surrounding structures were obtained without intravenous contrast. Angiographic images of the Circle of Willis were obtained using MRA technique without intravenous contrast. Angiographic images of the neck were obtained using MRA technique without intravenous contrast. Carotid stenosis measurements (when applicable) are obtained utilizing NASCET criteria, using the distal internal carotid diameter as the denominator. COMPARISON:  Same study from 8 days ago FINDINGS: MRI HEAD FINDINGS Brain: New cortically based infarct at the right temporal operculum. Continued restricted diffusion at the genu of the corpus callosum, at the right stratum  and corona radiata, and weakly within the left corona radiata. Punctate restricted diffusion at the left anterior temporal cortex. The right corona radiata infarct is more confluent than before but in the same distribution. Confluent chronic small vessel ischemic gliosis in the periventricular white matter with chronic lacunes in the right pons and bilateral deep gray nuclei. Low cerebral volume for age, especially in the cerebellum. No chronic blood products, acute hemorrhage, hydrocephalus, or collection. Vascular: Arterial findings below.  Normal dural venous flow voids. 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 Superimposed atheromatous irregularity on the right more than left cavernous carotids with 50% narrowing at the right paraclinoid segment. Negative for branch occlusion, beading, or aneurysm. No proximal flow limiting stenosis. Fairly symmetric vertebral arteries in the posterior circulation. Atheromatous undulation of vertebral and basilar arteries with up to 50% narrowing at the proximal basilar. Advanced right P2 segment stenosis with flow gap. Bilateral posterior cerebral branches show high-grade narrowing. Negative for aneurysm. MRA NECK FINDINGS Unremarkable arch where covered. The left vertebral artery is either early branching or arises from the aorta. Antegrade flow in the carotid and vertebral arteries bilaterally. Mild plaque is likely at the carotid bifurcations. Where not distorted by motion, the vertebral arteries are smoothly contoured and widely patent. IMPRESSION: Brain MRI: 1. Small acute cortical infarct at the right temporal operculum. 2. Re-identified pre-existing infarcts. T he right basal ganglia infarct became more confluent but is in the same distribution. 3. Chronic small vessel ischemia with chronic lacunar infarcts. Chronic small left frontal  cortex infarct. Intracranial MRA: 1. No interval or emergent findings. 2.  Intracranial atherosclerosis with 50% narrowing of the right ICA and proximal basilar. 3. Advanced bilateral PCA stenoses, more proximal in the right where it affects the P2 segment. Neck MRA: Common artifact seen with noncontrast neck MRA. No evidence of stenosis or interval change. Electronically Signed   By: Tiburcio PeaJonathan  Watts M.D.   On: 08/02/2021 13:05   MR BRAIN WO CONTRAST  Result Date: 08/02/2021 CLINICAL DATA:  Stroke follow-up EXAM: MRI HEAD WITHOUT CONTRAST MRA HEAD WITHOUT CONTRAST MRA NECK WITHOUT CONTRAST TECHNIQUE: Multiplanar, multiecho pulse sequences of the brain and surrounding structures were obtained without intravenous contrast. Angiographic images of the Circle of Willis were obtained using MRA technique without intravenous contrast. Angiographic images of the neck were obtained using MRA technique without intravenous contrast. Carotid stenosis measurements (when applicable) are obtained utilizing NASCET criteria, using the distal internal carotid diameter as the denominator. COMPARISON:  Same study from 8 days ago FINDINGS: MRI HEAD FINDINGS Brain: New cortically based infarct at the right temporal operculum. Continued restricted diffusion at the genu of the corpus callosum, at the right stratum and corona radiata, and weakly within the left corona radiata. Punctate restricted diffusion at the left anterior temporal cortex. The right corona radiata infarct is more confluent than before but in the same distribution. Confluent chronic small vessel ischemic gliosis in the periventricular white matter with chronic lacunes in the right pons and bilateral deep gray nuclei. Low cerebral volume for age, especially in the cerebellum. No chronic blood products, acute hemorrhage, hydrocephalus, or collection. Vascular: Arterial findings below.  Normal dural venous flow voids. 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 Superimposed atheromatous irregularity on the right more than left cavernous carotids with 50% narrowing at the right paraclinoid segment. Negative for branch occlusion, beading, or aneurysm. No proximal flow limiting stenosis. Fairly symmetric vertebral arteries in the posterior circulation. Atheromatous undulation of vertebral and basilar arteries with up to 50% narrowing at the proximal basilar. Advanced right P2 segment stenosis with flow gap. Bilateral posterior cerebral branches show high-grade narrowing. Negative for aneurysm. MRA NECK FINDINGS Unremarkable arch where covered. The left vertebral artery is either early branching or arises from the aorta. Antegrade flow in the carotid and vertebral arteries bilaterally. Mild plaque is likely at the carotid bifurcations. Where not distorted by motion, the vertebral arteries are smoothly contoured and widely patent. IMPRESSION: Brain MRI: 1. Small acute cortical infarct at the right temporal operculum. 2. Re-identified pre-existing infarcts. T he right basal ganglia infarct became more confluent but is in the same distribution. 3. Chronic small vessel ischemia with chronic lacunar infarcts. Chronic small left frontal cortex infarct. Intracranial MRA: 1. No interval or emergent findings. 2. Intracranial atherosclerosis with 50% narrowing of the right ICA and proximal basilar. 3. Advanced bilateral PCA stenoses, more proximal in the right where it affects the P2 segment. Neck MRA: Common artifact seen with noncontrast neck MRA. No evidence of stenosis or interval change. Electronically Signed   By: Tiburcio PeaJonathan  Watts M.D.   On: 08/02/2021 13:05   CT HEAD CODE STROKE WO CONTRAST  Result Date: 08/01/2021 CLINICAL DATA:  Code stroke. Neuro deficit, acute, stroke suspected. EXAM: CT HEAD WITHOUT CONTRAST TECHNIQUE: Contiguous axial images were  obtained from the base of the skull through the vertex without intravenous contrast. RADIATION DOSE REDUCTION: This exam was performed according to the departmental dose-optimization program which includes  automated exposure control, adjustment of the mA and/or kV according to patient size and/or use of iterative reconstruction technique. COMPARISON:  Head CT and MRI 07/25/2021 FINDINGS: Brain: Hypodensities in the right corona radiata/basal ganglia, left corona radiata, and genu of the corpus callosum correspond to acute infarcts on the prior MRI. Additional punctate acute infarcts on the prior MRI are not evident by CT. No definite acute cortically based infarct, intracranial hemorrhage, mass, midline shift, or extra-axial fluid collection is identified. There is a background of moderately extensive chronic small vessel ischemia in the cerebral white matter, and small chronic infarcts are noted involving left frontal cortex, left basal ganglia, thalami, and pons. The ventricles are normal in size. Vascular: Calcified atherosclerosis at the skull base. No suspicious acute vascular hyperdensity. Skull: No acute fracture or suspicious osseous lesion. Sinuses/Orbits: Small volume left sphenoid sinus fluid. Clear mastoid air cells. Unremarkable orbits. Other: None. ASPECTS [facility] Stroke Program Early CT Score) Not scored due to the presence of multiple recent infarcts and no provided localizing symptoms. IMPRESSION: 1. No intracranial hemorrhage or definite new infarct. 2. Multiple evolving infarcts as shown on recent MRI. 3. Moderately extensive chronic small vessel ischemia. These results were communicated to Dr. Amada Jupiter at 6:12 pm on 08/01/2021 by text page via the [facility] messaging system. Electronically Signed   By: Sebastian Ache M.D.   On: 08/01/2021 18:12     PHYSICAL EXAM Temp:  [98.1 F (36.7 C)-98.2 F (36.8 C)] 98.2 F (36.8 C) (02/12 0329) Pulse Rate:  [56-67] 64 (02/12 0329) Resp:  [12-19] 16  (02/12 0329) BP: (121-174)/(75-99) 174/79 (02/12 0329) SpO2:  [90 %-99 %] 96 % (02/12 0329)  General - Well nourished, well developed, male, at times tearful, but in no acute physical distress.  Cardiovascular - Regular rhythm and rate.  Mental Status -  Level of arousal and orientation to time, type of place (hospital but no city/name of hospital), and person intact Language with mild improvements to dysarthria Patient confused, crying, and expressing thoughts about no one being able to help him.  Labile affect with inappropriate crying.  Some paranoid ideation  Cranial Nerves II - XII - II - Visual field unable to be assessed due to lack of patient cooperation III, IV, VI - Patient with R gaze preference, not tracking to the left. Does not blink to threat on the L, but blinks to threat on the right.  VII - Facial movement with L facial droop. VIII - Hearing & vestibular intact bilaterally. XII - Tongue protrusion with mild L deviation.  Motor Strength - The patients strength was 4/5 in RUE and RLE and pronator drift was absent. Strength was 0/5 LUE and appeared 1/5 in LLE (patient barely moves).  Bulk was normal and fasciculations were absent. Motor Tone - Muscle tone was assessed at the appendages and was normal except flaccid LUE.  Coordination - The patient had normal movements in the RUE and RLE with no ataxia or dysmetria.  Tremor was absent.  Gait and Station - deferred.    ASSESSMENT/PLAN Mr. TAKESHI TEASDALE is a 56 y.o. male with history of prior stroke, recent TIA, and HTN re-consulted to neurology on 2/11 as a repeat Code Stroke with mental status changes and continued weakness.  Stroke: New- Cortical infarct at the R temporal operculum, likely cryptogenic etiology but unlikely to explain his confusion altered mental status which is likely toxic metabolic possibly from medication change Initial- multifocal infarct, embolic pattern, likely secondary to PFO CT head:  New- 2/11, No new infarcts, multiple evolving infarcts, and chronic small vessel ischemic disease Initial- no acute ischemic or hemorrhagic changes but age-indeterminate lacunar infarcts and chronic small vessel ischemic changes.  MRI:  New, 2/12, acute small cortical infarct at the R temporal operculum Initial- acute infarcts of R frontal lobe, bilateral corona radiata, L temporal lobe/hippocampus, and R callosal genu MRA: New, 2/12, No interval changes Initial- severe stenosis of P2 R PCA and mod-sev stenosis of P2/P3 L PCA. Bilateral LE Doppler: No evidence of DVT 2D Echo LVEF 60-65%, mild LVH, tricuspid aortic valve TEE Large PFO w/ R to L shunting EEG Pending 30-day cardiac event monitoring as outpatient to rule out A-fib; ordered 2/10 LDL 244 HgbA1c 5.9 VTE prophylaxis - Lovenox No antithrombotic prior to admission, now on aspirin 81 mg daily and clopidogrel 75 mg daily for 3 weeks and then ASA alone.  Therapy recommendations:  CIR Disposition:   CIR  Altered Mental Status Need to rule out infectious cause vs. Seizure vs. Metabolic vs. medication-induced (patient with addition of HCTZ 2/11 AM, of which confusion can be an AE) CBC, UA, BMP pending  EEG pending  PFO TEE showed large PFO with right-to-left shunting ROPE score 4, patient not ideal candidate for PFOclosure. will follow-up outpatient with Dr. Excell Seltzer  Hypertension Home meds:  Lisinopril Stable Gradually normalize BP in 2-3 days Long-term BP goal normotensive  Hyperlipidemia Home meds:  N/A LDL 244, goal < 70 Continue Atorvastatin 80 mg  Continue statin at discharge  Tobacco abuse Current smoker Smoking cessation counseling provided Pt is willing to quit  Hx of substance abuse Pt admitted hx of remote cocaine use but not recently Admitted using THC intermittently Takes adderall prescribed by his doctor in [facility] for concentration, UDS positive for Amphetamines   Other Stroke Risk Factors Hx stroke/TIA -  last year with syncope, was treated in [facility] Suspected OSA: Per primary team - "Placed on 2L O2 by RN due to de-saturation 84-88% at times during apnea overnight. Oxygen saturation remained 92% or higher with 2L of supplemental oxygen."- outpt sleep study recommended  Other Active Problems   Hospital day # 7   Lamar Sprinkles, MD Stroke Neurology- Neuro Psych Resident 08/02/2021 3:02 PM I have personally obtained history,examined this patient, reviewed notes, independently viewed imaging studies, participated in medical decision making and plan of care.ROS completed by me personally and pertinent positives fully documented  I have made any additions or clarifications directly to the above note. Agree with note above.  Patient recent by cerebral infarcts of cryptogenic etiology with a large PFO with sudden onset of confusion, disorientation, hallucinations, paranoia labile pseudobulbar affect likely from toxic metabolic etiology.  Recommend discontinue baclofen and gabapentin and check UA, CMP, thyroid function tests and chest x-ray.  Continue aspirin and Plavix for 3 weeks and then aspirin alone.  Long discussion with patient's mother and patient and answer questions about his care.  Greater than 50% time during this 50-minute visit was spent on counseling and coordination of care about his altered mental status and neurological worsening plan for evaluation and treatment and answered questions. Delia Heady, MD Medical Director [facility] Pager: [phone] 08/02/2021 4:09 PM    To contact Stroke Continuity provider, please refer to [email or web address]. After hours, contact General Neurology

## 2021-08-02 NOTE — Progress Notes (Signed)
EEG complete - results pending 

## 2021-08-02 NOTE — Procedures (Signed)
[facility]  Routine EEG Report  Date and Time of Study: 08/02/21 at 0843 AM. Duration: 25 min.  Indication: Altered mentation.  Technical Summary: A routine 20-channel electroencephalogram using the international 10-20 system of electrode placement was performed.  Background: The best posterior dominant rhythm (PDR) identified was 6 Hz. The background consisted predominantly of theta slowing (5-6 Hz) with intermittent symmetric delta slowing (3-4 Hz). There was additional subtle focal slowing in the right hemisphere. The background was mildly reactive to stimulation.  States: Awake. Definitive sleep structures were not seen.   Activation Procedures:  Hyperventilation: Not performed  Photic Stimulation: Not performed  Classification: abnormal due to:  1. Theta with intermittent intermixed delta slowing.  2. Additional focal slowing in the right hemisphere.  Diagnosis and Clinical Correlation: This is an abnormal EEG. The generalized slowing seen in this EEG is consistent with diffuse cortical dysfunction and moderate non-specific encephalopathy. Additional focal slowing in the right hemisphere is suggestive of additional focal neuronal dysfunction in that region. There were no epileptiform discharges seen. No seizures were seen.  Flonnie Overman, MD CNP/Epilepsy [facility] [phone] Case: [id number]

## 2021-08-02 NOTE — Progress Notes (Signed)
° °  Subjective NAOE  He is more alert and eating at bedside. He continues to have some frustration related to his current neurological deficits.   Objective:  Vital signs in last 24 hours: Vitals:   08/01/21 1939 08/01/21 2200 08/01/21 2311 08/02/21 0329  BP: (!) 142/87 (!) 121/99 140/85 (!) 174/79  Pulse: (!) 56 62 62 64  Resp: 17 19 18 16   Temp: 98.1 F (36.7 C)  98.1 F (36.7 C) 98.2 F (36.8 C)  TempSrc: Axillary  Oral Oral  SpO2: 96% 90% 96% 96%  Weight:      Height:       Constitutional: alert, well-appearing, in NAD  Cardiovascular: RRR, no m/r/g, no edema of  bilateral LE Pulmonary/Chest: normal work of breathing on room air, LCTAB Abdominal: soft, non-tender to palpation, non-distended MSK: normal bulk and tone,  Neurological: A&O x 3, follows commands. Persistent, stable left facial droop, dysarthria and 0/5 drift of left upper extremity and left lower extremity. Left hemi-neglect.   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 MRI brain (2/12) revealed new small acute cortical infarct yesterday. MRA head/neck unchanged from admission. EEG with diffuse cortical dysfunction and moderate non-specific encephalopathy; negative for seizure. Stroke team evaluated pt, and felt his new confusion/hallucinations were related to recent increase in Baclofen dose. Baclofen and gabapentin held; labs reassuring and CXR negative for infection.  - Discontinued Baclofen and Gabapentin, will slowly add low doses of these  - Continue ASA 81mg  daily - Continue Clopidogrel 75mg  daily for 3 weeks - Atorvastatin 80 mg  - Tele monitoring  - PT/OT   - Denied CIR; SW assisting with SNF placement - Outpatient sleep  study recommended  - Follow up in clinic with Dr 12-14-1981 in 4 weeks  - 30 day cardiac monitoring ordered   - Not candidate for closure; OP follow up w/ Dr .  HTN Goal is normotension. BP improved on current regimen.   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 Free T4 improved to 0.41 wiht IV Synthroid. Improved alertness and bradycardia mostly resolved.  - Continue with PO synthroid   - Recheck TSH and free T4 in 4-6 weeks  Chronic pain  Muscle spasm  Lower leg muscle spasms since his stroke. Stopped baclofen and gabapentin in s/o of acute confusion as above.  - Holding gabapentin 300mg  TID  - Holding Baclofen 40 TID  - Will consider resuming these at lower doses in the future.    Best Practice: Diet: Regular diet  IVF: None,None VTE: Levenox 40 Code: Full   Pearlean Brownie, MD PGY-2 Internal Medicine  Pager [phone]

## 2021-08-03 LAB — URINALYSIS, ROUTINE W REFLEX MICROSCOPIC
Bilirubin Urine: NEGATIVE
Glucose, UA: NEGATIVE mg/dL
Hgb urine dipstick: NEGATIVE
Ketones, ur: NEGATIVE mg/dL
Leukocytes,Ua: NEGATIVE
Nitrite: POSITIVE — AB
Protein, ur: 30 mg/dL — AB
Specific Gravity, Urine: 1.024 (ref 1.005–1.030)
pH: 5 (ref 5.0–8.0)

## 2021-08-03 MED ORDER — BACLOFEN 10 MG PO TABS
10.0000 mg | ORAL_TABLET | Freq: Once | ORAL | Status: AC
  Administered 2021-08-03: 10 mg via ORAL
  Filled 2021-08-03: qty 1

## 2021-08-03 NOTE — Progress Notes (Signed)
OT Cancellation Note  Patient Details Name: Jason Figueroa MRN: [id number] DOB: 04-01-1966   Cancelled Treatment:    Reason Eval/Treat Not Completed: Patient with another discipline at this time. OT to check back 2/14.   Kallie Edward OTR/L Supplemental OT, Department of rehab services [phone]  Nainika Newlun R H. 08/03/2021, 2:05 PM

## 2021-08-03 NOTE — Progress Notes (Addendum)
STROKE TEAM PROGRESS NOTE   SUBJECTIVE (INTERVAL HISTORY) Jason Figueroa is seen at the bedside alone. Overall his condition is significantly improved. Jason Figueroa with continued L sided weakness and L hemi-neglect but no longer confused, with delusions or hallucinations, nor pseudobulbar affect. Jason Figueroa has increased his PO intake this morning. Baclofen and gabapentin were d/c yesterday.  New CT Head obtained during code stroke yesterday showed no acute intracranial abnormalities and evolving of the multiple infarcts seen on prior imaging. MRI revealed an new small acute   cortical infarct at the R temporal operculum and continued to show the pre-existing infarcts and chronic small vessel ischemia with chronic lacunar infarcts. No changes on MRA.  OBJECTIVE CBC:  Recent Labs  Lab 08/02/21 1529  WBC 10.1  HGB 15.9  HCT 46.1  MCV 92.2  PLT 257    Basic Metabolic Panel:  Recent Labs  Lab 08/01/21 1932 08/02/21 1529  NA 140 140  K 3.8 4.2  CL 104 106  CO2 25 22  GLUCOSE 112* 115*  BUN 24* 28*  CREATININE 1.14 1.10  CALCIUM 9.5 9.5    Lipid Panel:  No results for input(s): CHOL, TRIG, HDL, CHOLHDL, VLDL, LDLCALC in the last 168 hours.  HgbA1c:  No results for input(s): HGBA1C in the last 168 hours.  Urine Drug Screen:  No results for input(s): LABOPIA, COCAINSCRNUR, LABBENZ, AMPHETMU, THCU, LABBARB in the last 168 hours.   Alcohol Level No results for input(s): ETH in the last 168 hours.  IMAGING past 24 hours DG Chest 2 View  Result Date: 08/02/2021 CLINICAL DATA:  Code stroke.  Altered mental status. EXAM: CHEST - 2 VIEW COMPARISON:  None. FINDINGS: Cardiac silhouette is normal in size and configuration. Normal mediastinal and hilar contours. Clear lungs.  No pleural effusion.  No pneumothorax. Skeletal structures are intact. IMPRESSION: No active cardiopulmonary disease. Electronically Signed   By: Amie Portlandavid  Ormond M.D.   On: 08/02/2021 17:41   MR ANGIO HEAD WO CONTRAST  Result Date:  08/02/2021 CLINICAL DATA:  Stroke follow-up EXAM: MRI HEAD WITHOUT CONTRAST MRA HEAD WITHOUT CONTRAST MRA NECK WITHOUT CONTRAST TECHNIQUE: Multiplanar, multiecho pulse sequences of the brain and surrounding structures were obtained without intravenous contrast. Angiographic images of the Circle of Willis were obtained using MRA technique without intravenous contrast. Angiographic images of the neck were obtained using MRA technique without intravenous contrast. Carotid stenosis measurements (when applicable) are obtained utilizing NASCET criteria, using the distal internal carotid diameter as the denominator. COMPARISON:  Same study from 8 days ago FINDINGS: MRI HEAD FINDINGS Brain: New cortically based infarct at the right temporal operculum. Continued restricted diffusion at the genu of the corpus callosum, at the right stratum and corona radiata, and weakly within the left corona radiata. Punctate restricted diffusion at the left anterior temporal cortex. The right corona radiata infarct is more confluent than before but in the same distribution. Confluent chronic small vessel ischemic gliosis in the periventricular white matter with chronic lacunes in the right pons and bilateral deep gray nuclei. Low cerebral volume for age, especially in the cerebellum. No chronic blood products, acute hemorrhage, hydrocephalus, or collection. Vascular: Arterial findings below.  Normal dural venous flow voids. 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 Superimposed atheromatous irregularity on the right more than left cavernous carotids with 50% narrowing at the right paraclinoid segment. Negative for branch occlusion, beading, or  aneurysm. No proximal flow limiting stenosis. Fairly symmetric vertebral arteries in the posterior circulation. Atheromatous undulation of vertebral and basilar  arteries with up to 50% narrowing at the proximal basilar. Advanced right P2 segment stenosis with flow gap. Bilateral posterior cerebral branches show high-grade narrowing. Negative for aneurysm. MRA NECK FINDINGS Unremarkable arch where covered. The left vertebral artery is either early branching or arises from the aorta. Antegrade flow in the carotid and vertebral arteries bilaterally. Mild plaque is likely at the carotid bifurcations. Where not distorted by motion, the vertebral arteries are smoothly contoured and widely patent. IMPRESSION: Brain MRI: 1. Small acute cortical infarct at the right temporal operculum. 2. Re-identified pre-existing infarcts. T Jason Figueroa right basal ganglia infarct became more confluent but is in the same distribution. 3. Chronic small vessel ischemia with chronic lacunar infarcts. Chronic small left frontal cortex infarct. Intracranial MRA: 1. No interval or emergent findings. 2. Intracranial atherosclerosis with 50% narrowing of the right ICA and proximal basilar. 3. Advanced bilateral PCA stenoses, more proximal in the right where it affects the P2 segment. Neck MRA: Common artifact seen with noncontrast neck MRA. No evidence of stenosis or interval change. Electronically Signed   By: Tiburcio Pea M.D.   On: 08/02/2021 13:05   MR ANGIO NECK WO CONTRAST  Result Date: 08/02/2021 CLINICAL DATA:  Stroke follow-up EXAM: MRI HEAD WITHOUT CONTRAST MRA HEAD WITHOUT CONTRAST MRA NECK WITHOUT CONTRAST TECHNIQUE: Multiplanar, multiecho pulse sequences of the brain and surrounding structures were obtained without intravenous contrast. Angiographic images of the Circle of Willis were obtained using MRA technique without intravenous contrast. Angiographic images of the neck were obtained using MRA technique without intravenous contrast. Carotid stenosis measurements (when applicable) are obtained utilizing NASCET criteria, using the distal internal carotid diameter as the denominator.  COMPARISON:  Same study from 8 days ago FINDINGS: MRI HEAD FINDINGS Brain: New cortically based infarct at the right temporal operculum. Continued restricted diffusion at the genu of the corpus callosum, at the right stratum and corona radiata, and weakly within the left corona radiata. Punctate restricted diffusion at the left anterior temporal cortex. The right corona radiata infarct is more confluent than before but in the same distribution. Confluent chronic small vessel ischemic gliosis in the periventricular white matter with chronic lacunes in the right pons and bilateral deep gray nuclei. Low cerebral volume for age, especially in the cerebellum. No chronic blood products, acute hemorrhage, hydrocephalus, or collection. Vascular: Arterial findings below.  Normal dural venous flow voids. 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 Superimposed atheromatous irregularity on the right more than left cavernous carotids with 50% narrowing at the right paraclinoid segment. Negative for branch occlusion, beading, or aneurysm. No proximal flow limiting stenosis. Fairly symmetric vertebral arteries in the posterior circulation. Atheromatous undulation of vertebral and basilar arteries with up to 50% narrowing at the proximal basilar. Advanced right P2 segment stenosis with flow gap. Bilateral posterior cerebral branches show high-grade narrowing. Negative for aneurysm. MRA NECK FINDINGS Unremarkable arch where covered. The left vertebral artery is either early branching or arises from the aorta. Antegrade flow in the carotid and vertebral arteries bilaterally. Mild plaque is likely at the carotid bifurcations. Where not distorted by motion, the vertebral arteries are smoothly contoured and widely patent. IMPRESSION: Brain MRI: 1. Small acute cortical infarct at the right temporal  operculum. 2. Re-identified pre-existing infarcts.  T Jason Figueroa right basal ganglia infarct became more confluent but is in the same distribution. 3. Chronic small vessel ischemia with chronic lacunar infarcts. Chronic small left frontal cortex infarct. Intracranial MRA: 1. No interval or emergent findings. 2. Intracranial atherosclerosis with 50% narrowing of the right ICA and proximal basilar. 3. Advanced bilateral PCA stenoses, more proximal in the right where it affects the P2 segment. Neck MRA: Common artifact seen with noncontrast neck MRA. No evidence of stenosis or interval change. Electronically Signed   By: Tiburcio PeaJonathan  Watts M.D.   On: 08/02/2021 13:05   MR BRAIN WO CONTRAST  Result Date: 08/02/2021 CLINICAL DATA:  Stroke follow-up EXAM: MRI HEAD WITHOUT CONTRAST MRA HEAD WITHOUT CONTRAST MRA NECK WITHOUT CONTRAST TECHNIQUE: Multiplanar, multiecho pulse sequences of the brain and surrounding structures were obtained without intravenous contrast. Angiographic images of the Circle of Willis were obtained using MRA technique without intravenous contrast. Angiographic images of the neck were obtained using MRA technique without intravenous contrast. Carotid stenosis measurements (when applicable) are obtained utilizing NASCET criteria, using the distal internal carotid diameter as the denominator. COMPARISON:  Same study from 8 days ago FINDINGS: MRI HEAD FINDINGS Brain: New cortically based infarct at the right temporal operculum. Continued restricted diffusion at the genu of the corpus callosum, at the right stratum and corona radiata, and weakly within the left corona radiata. Punctate restricted diffusion at the left anterior temporal cortex. The right corona radiata infarct is more confluent than before but in the same distribution. Confluent chronic small vessel ischemic gliosis in the periventricular white matter with chronic lacunes in the right pons and bilateral deep gray nuclei. Low cerebral volume for  age, especially in the cerebellum. No chronic blood products, acute hemorrhage, hydrocephalus, or collection. Vascular: Arterial findings below.  Normal dural venous flow voids. 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 Superimposed atheromatous irregularity on the right more than left cavernous carotids with 50% narrowing at the right paraclinoid segment. Negative for branch occlusion, beading, or aneurysm. No proximal flow limiting stenosis. Fairly symmetric vertebral arteries in the posterior circulation. Atheromatous undulation of vertebral and basilar arteries with up to 50% narrowing at the proximal basilar. Advanced right P2 segment stenosis with flow gap. Bilateral posterior cerebral branches show high-grade narrowing. Negative for aneurysm. MRA NECK FINDINGS Unremarkable arch where covered. The left vertebral artery is either early branching or arises from the aorta. Antegrade flow in the carotid and vertebral arteries bilaterally. Mild plaque is likely at the carotid bifurcations. Where not distorted by motion, the vertebral arteries are smoothly contoured and widely patent. IMPRESSION: Brain MRI: 1. Small acute cortical infarct at the right temporal operculum. 2. Re-identified pre-existing infarcts. T Jason Figueroa right basal ganglia infarct became more confluent but is in the same distribution. 3. Chronic small vessel ischemia with chronic lacunar infarcts. Chronic small left frontal cortex infarct. Intracranial MRA: 1. No interval or emergent findings. 2. Intracranial atherosclerosis with 50% narrowing of the right ICA and proximal basilar. 3. Advanced bilateral PCA stenoses, more proximal in the right where it affects the P2 segment. Neck MRA: Common artifact seen with noncontrast neck MRA. No evidence of stenosis or interval change. Electronically Signed   By: Tiburcio PeaJonathan  Watts  M.D.   On: 08/02/2021 13:05   EEG adult  Result Date: 08/02/2021 Murvin NatalGangloff, Steven J, MD     08/02/2021  6:22 PM [facility]  [facility] Routine EEG Report Date and Time of Study: 08/02/21 at 0843 AM. Duration: 25 min. Indication: Altered mentation. Technical Summary: A routine 20-channel electroencephalogram using the international 10-20 system of electrode placement was performed. Background: The best posterior dominant rhythm (PDR) identified was 6 Hz. The background consisted predominantly of theta slowing (5-6 Hz) with intermittent symmetric delta slowing (3-4 Hz). There was additional subtle focal slowing in the right hemisphere. The background was mildly reactive to stimulation. States: Awake. Definitive sleep structures were not seen. Activation Procedures: Hyperventilation: Not performed Photic Stimulation: Not performed Classification: abnormal due to: 1. Theta with intermittent intermixed delta slowing. 2. Additional focal slowing in the right hemisphere. Diagnosis and Clinical Correlation: This is an abnormal EEG. The generalized slowing seen in this EEG is consistent with diffuse cortical dysfunction and moderate non-specific encephalopathy. Additional focal slowing in the right hemisphere is suggestive of additional focal neuronal dysfunction in that region. There were no epileptiform discharges seen. No seizures were seen. Flonnie Overman, MD CNP/Epilepsy [facility] [phone] Case: [id number]    PHYSICAL EXAM Temp:  [97.4 F (36.3 C)-98.6 F (37 C)] 98.6 F (37 C) (02/13 0757) Pulse Rate:  [58-74] 60 (02/13 0719) Resp:  [10-15] 10 (02/13 0719) BP: (138-139)/(77-118) 139/77 (02/13 0719) SpO2:  [91 %-96 %] 96 % (02/13 0719)  General - Well nourished, well developed middle-aged Caucasian, male, but in no acute physical distress.   Cardiovascular - Regular rhythm and rate.  Mental Status -  Level of arousal and orientation to time, place, and person  intact Language with mild improvements to dysarthria No longer tearful, with appropriate affect  Cranial Nerves II - XII - II - Visual field intact OU. III, IV, VI - Jason Figueroa now tracking to the left. However, does not blink to threat on the L, but blinks to threat on the right.  VII - Facial movement with L facial droop. VIII - Hearing & vestibular intact bilaterally. XII - Tongue protrusion with mild L deviation.  Motor Strength - The patients strength was 4/5 in RUE and RLE and pronator drift was absent. Strength was 0/5 LUE and appeared 1/5 in LLE (Jason Figueroa barely moves).  Bulk was normal and fasciculations were absent. Motor Tone - Muscle tone was assessed at the appendages and was normal except flaccid LUE.  Coordination - The Jason Figueroa had normal movements in the RUE and RLE with no ataxia or dysmetria.  Tremor was absent.  Gait and Station - deferred.    ASSESSMENT/PLAN Jason Figueroa is a 56 y.o. male with history of prior stroke, recent TIA, and HTN re-consulted to neurology on 2/11 as a repeat Code Stroke with mental status changes and continued weakness.  Stroke: New- Cortical infarct at the R temporal operculum, likely cryptogenic etiology but unlikely to explain his confusion altered mental status which is likely toxic metabolic possibly from medication change Initial- multifocal infarct, embolic pattern, likely secondary to PFO CT head: New- 2/11, No new infarcts, multiple evolving infarcts, and chronic small vessel ischemic disease Initial- no acute ischemic or hemorrhagic changes but age-indeterminate lacunar infarcts and chronic small vessel ischemic changes.  MRI:  New, 2/12, acute small cortical infarct at the R temporal operculum Initial- acute infarcts of R frontal lobe, bilateral corona radiata, L temporal lobe/hippocampus, and R callosal genu MRA: New, 2/12, No interval changes Initial- severe stenosis of P2 R PCA and mod-sev stenosis of P2/P3 L  PCA. Bilateral LE Doppler: No evidence of DVT 2D Echo LVEF 60-65%, mild  LVH, tricuspid aortic valve TEE Large PFO w/ R to L shunting EEG Pending 30-day cardiac event monitoring as outpatient to rule out A-fib; ordered 2/10 LDL 244 HgbA1c 5.9 VTE prophylaxis - Lovenox No antithrombotic prior to admission, now on aspirin 81 mg daily and clopidogrel 75 mg daily for 3 weeks (start 2/5) and then ASA alone.  Therapy recommendations:  CIR Disposition:   CIR  Altered Mental Status Likely medication-induced (Jason Figueroa with addition of Baclofen 40 mg TID 2/10, of which confusion can be an AE) Baclofen and Gabapentin held and Jason Figueroa with much better affect. CBC WNL, VSS, UA pos nitrites and rare bacteria, BMP WNL, TSH 41.179, free T3 1.3, free T4 0.41 EEG with diffuse cortical dysfunction and mod non-specific encephalopathy. No seizures.  PFO TEE showed large PFO with right-to-left shunting ROPE score 4, Jason Figueroa not ideal candidate for PFOclosure. will follow-up outpatient with Dr. Excell Seltzer  Hypertension Home meds:  Lisinopril Stable Gradually normalize BP in 2-3 days Long-term BP goal normotensive  Hyperlipidemia Home meds:  N/A LDL 244, goal < 70 Continue Atorvastatin 80 mg  Continue statin at discharge  Tobacco abuse Current smoker Smoking cessation counseling provided Pt is willing to quit  Hx of substance abuse Pt admitted hx of remote cocaine use but not recently Admitted using THC intermittently Takes adderall prescribed by his doctor in [facility] for concentration, UDS positive for Amphetamines   Other Stroke Risk Factors Hx stroke/TIA - last year with syncope, was treated in [facility] Suspected OSA: Per primary team - "Placed on 2L O2 by RN due to de-saturation 84-88% at times during apnea overnight. Oxygen saturation remained 92% or higher with 2L of supplemental oxygen."- outpt sleep study recommended  Other Active Problems   Hospital day # 8   Lamar Sprinkles, MD Stroke  Neurology- Neuro Psych Resident 08/03/2021 8:13 AM I have personally obtained history,examined this Jason Figueroa, reviewed notes, independently viewed imaging studies, participated in medical decision making and plan of care.ROS completed by me personally and pertinent positives fully documented  I have made any additions or clarifications directly to the above note. Agree with note above.  Jason Figueroa's mental status has significantly improved and was likely related to high-dose of baclofen which has been held.  Continue aspirin and Plavix for 3 weeks and then aspirin alone and aggressive risk factor modification.  Continue ongoing therapies and transfer to inpatient rehab when bed available.  Stroke team will sign off.  Kindly call for questions.  Greater than 50% time during this 35-minute visit was spent on counseling and coordination of care about his altered mental status and stroke and encephalopathy and discussion with Jason Figueroa and care team and answering questions.  Delia Heady, MD Medical Director [facility] Pager: [phone] 08/03/2021 3:44 PM   To contact Stroke Continuity provider, please refer to [email or web address]. After hours, contact General Neurology

## 2021-08-03 NOTE — Progress Notes (Signed)
Subjective ON: Muscle spasm; received a total of 10 mg baclofen twice during the night; continued mentating well. Zofran for nausea.    He is more alert sitting in chair. He just completed a session with OT. He is engaging in more conversation today and is interactive. He reports feeling well overall. Reports improvement in spasms with the baclofen he received overnight.   Objective:  Vital signs in last 24 hours: Vitals:   08/03/21 0410 08/03/21 0719 08/03/21 0757 08/03/21 1101  BP: (!) 139/106 139/77  122/87  Pulse: 64 60  62  Resp: 15 10  12   Temp: 98.2 F (36.8 C) 98.4 F (36.9 C) 98.6 F (37 C) 97.6 F (36.4 C)  TempSrc: Oral Oral Oral Axillary  SpO2: 91% 96%  96%  Weight:      Height:       Constitutional: alert, well-appearing, in NAD  Cardiovascular: RRR, no m/r/g, no edema of  bilateral LE Pulmonary/Chest: normal work of breathing on room air, LCTAB Abdominal: soft, non-tender to palpation, non-distended MSK: normal bulk and tone,  Neurological: A&O x 3, follows commands. Persistent, stable left facial droop, dysarthria and 0/5 drift of left upper extremity and left lower extremity.    Assessment/Plan:  Principal Problem:   Left-sided weakness Active Problems:   Stroke [facility](HCC)   Multifocal embolic ischemic brain infarcts 2/2 large PFO  New small acute cortical infarct at the right temporal operculum  Acute toxic encephalopathy, resolved  2/11 patient became more altered and there was concern for RUE weakness; MRI brain (2/12) revealed new small acute cortical infarct, and MRA head/neck unchanged from admission. EEG with diffuse cortical encephalopathy and no indication for seizure. Suspect sxs 2/2 encephalopathy given improvement with cessation of Baclofen and Gabapentin. - Discontinued Baclofen and Gabapentin, will try slowly add low doses of these - Continue ASA 81mg  daily - Continue Clopidogrel 75mg  daily for 3 weeks - Atorvastatin 80 mg  - Tele monitoring   - PT/OT   - Denied CIR; SW assisting with SNF placement - Outpatient sleep study recommended  - Follow up in clinic with Dr Pearlean BrownieSethi in 4 weeks  - 30 day cardiac monitoring ordered   - Not candidate for closure; OP follow up w/ Dr Excell Seltzerooper.  AKI Bump in Cr from 1.1 to 1.45 today. Suspect this is 2/2 poor PO intake last couple of days when pt was not mentating well and recent addition of HCTZ.  - 1L IVF given  - Trend BMP - Discontinue HCTZ  HTN Goal is normotension. BP improved on current regimen.  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 Free T4 improved to 0.41 wiht IV Synthroid. Improved alertness and bradycardia mostly resolved.  - Continue with PO synthroid 100mcg   - Recheck TSH and free T4 in 4-6 weeks  Chronic pain  Muscle spasm  Lower leg muscle spasms since his stroke. Stopped baclofen and gabapentin in s/o of acute confusion as above, that improved with cessation. Required 20 mg baclofen overnight for spasm but continues to mentate wel. Will start with lower dose (Baclofen 10 mg TID) at this time  - Start back with Baclofen 10 TID  - Holding gabapentin 300mg  TID    Best Practice: Diet: Regular diet  IVF: None,None VTE: Levenox  40 Code: Full  Lajean Manes, MD  Internal Medicine Resident, PGY-1 [facility]  Pager: [phone] 1:26 PM, 08/03/2021

## 2021-08-03 NOTE — TOC Progression Note (Signed)
Transition of Care [facility]) - Progression Note    Patient Details  Name: Jason Figueroa MRN: [id number] Date of Birth: Sep 05, 1965  [facility] CM/SW Contact  Baldemar Lenis, [place] Phone Number: 08/03/2021, 10:26 AM  Clinical Narrative:   CSW noting per chart review this morning that patient has bed offer at [facility]. CSW checked with [facility] to ensure that they realized patient would be under LOG. [facility] unable to accept LOG, did not notice that patient does not have insurance. [facility] rescinded bed offer. No other bed offers at this time. CSW to follow.    Expected Discharge Plan: Skilled Nursing Facility Barriers to Discharge: Inadequate or no insurance, SNF Pending bed offer, SNF Pending payor source - LOG, SNF Pending Medicaid  Expected Discharge Plan and Services Expected Discharge Plan: Skilled Nursing Facility     Post Acute Care Choice: Skilled Nursing Facility Living arrangements for the past 2 months: Single Family Home                                       Social Determinants of Health (SDOH) Interventions    Readmission Risk Interventions No flowsheet data found.

## 2021-08-03 NOTE — Progress Notes (Signed)
Physical Therapy Treatment Patient Details Name: Jason Figueroa MRN: [id number] DOB: 25-Nov-1965 Today's Date: 08/03/2021   History of Present Illness 56 y.o. male presents to [facility] hospital on 07/25/2021 with numbness and tingling on left side of face along with L sided weakness. 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 PMH inlcudes HTN, previous stroke,  and recent TIA.    PT Comments    Pt's mother and friend at bedside upon arrival to room, pt and loved ones eager for pt to mobilize. Pt continues to present with impaired attention to LUE/LE and L hemiplegia, benefits from use of mirror for external cuing as well as tactile/verbal cuing in sitting and standing to correct posture and improve L-sided engagement. Pt tolerated repeated sit<>stands, as well as pre-gait activities in standing frame of stedy. PT to continue to follow acutely.     Recommendations for follow up therapy are one component of a multi-disciplinary discharge planning process, led by the attending physician.  Recommendations may be updated based on patient status, additional functional criteria and insurance authorization.  Follow Up Recommendations  Skilled nursing-short term rehab (<3 hours/day) (denied AIR)     Assistance Recommended at Discharge Frequent or constant Supervision/Assistance  Patient can return home with the following A lot of help with walking and/or transfers;A lot of help with bathing/dressing/bathroom;Assistance with cooking/housework;Assistance with feeding;Direct supervision/assist for medications management;Direct supervision/assist for financial management;Assist for transportation;Help with stairs or ramp for entrance   Equipment Recommendations  Wheelchair (measurements PT);Wheelchair cushion (measurements PT);Hospital bed    Recommendations for Other  Services       Precautions / Restrictions Precautions Precautions: Fall Precaution Comments: L hemiparesis Restrictions Weight Bearing Restrictions: No     Mobility  Bed Mobility Overal bed mobility: Needs Assistance Bed Mobility: Supine to Sit   Sidelying to sit: Mod assist, +2 for physical assistance       General bed mobility comments: Mod +2 for trunk elevation and LLE translation to EOB. Increased time and effort, step-by-step verbal and tactile cuing to perform.    Transfers Overall transfer level: Needs assistance Equipment used: Ambulation equipment used Transfers: Sit to/from Stand Sit to Stand: Min assist           General transfer comment: assist for rise, steadying. Max cues for sequencing, weight shifting to midline as pt with preference for L leaning, RUE placement. PT placing LUE in WB position on stedy for functional WB and shoulder girdle support, as well as facilitating L knee extension in standing. sit<>stands x6 during session Transfer via Lift Equipment: Stedy  Ambulation/Gait             Pre-gait activities: weight shifting L and R at hips 2x10 reps, heel taps x10, toe raises x10     Stairs             Wheelchair Mobility    Modified Rankin (Stroke Patients Only) Modified Rankin (Stroke Patients Only) Pre-Morbid Rankin Score: No symptoms Modified Rankin: Severe disability     Balance Overall balance assessment: Needs assistance Sitting-balance support: Single extremity supported, Feet supported Sitting balance-[name] Scale: Poor Sitting balance - Comments: L lateral and posterior leaning especially with fatigue Postural control: Left lateral lean Standing balance support: Bilateral upper extremity supported, Reliant on assistive device for balance Standing balance-[name] Scale: Poor Standing balance comment: reliant on external assist, requires support of LUE  Cognition  Arousal/Alertness: Awake/alert Behavior During Therapy: Impulsive (improving, waits for PT instruction today) Overall Cognitive Status: Impaired/Different from baseline Area of Impairment: Safety/judgement, Following commands, Problem solving                       Following Commands: Follows one step commands consistently Safety/Judgement: Decreased awareness of deficits, Decreased awareness of safety     General Comments: follows one-step commands consistently, requires mod cuing to adjust posture repeatedly and attend to L.        Exercises General Exercises - Lower Extremity Long Arc Quad: PROM, Left, 5 reps, Seated (with quad tapping, no quad activation observed) Hip Flexion/Marching: Left, 5 reps, Seated, PROM    General Comments        Pertinent Vitals/Pain Pain Assessment Pain Assessment: Faces Faces Pain Scale: No hurt Pain Intervention(s): Monitored during session    Home Living                          Prior Function            PT Goals (current goals can now be found in the care plan section) Acute Rehab PT Goals Patient Stated Goal: to regain strength and independence PT Goal Formulation: With patient Time For Goal Achievement: 08/10/21 Potential to Achieve Goals: Good Progress towards PT goals: Progressing toward goals    Frequency    Min 3X/week      PT Plan Current plan remains appropriate    Co-evaluation              AM-PAC PT "6 Clicks" Mobility   Outcome Measure  Help needed turning from your back to your side while in a flat bed without using bedrails?: A Lot Help needed moving from lying on your back to sitting on the side of a flat bed without using bedrails?: A Lot Help needed moving to and from a bed to a chair (including a wheelchair)?: Total Help needed standing up from a chair using your arms (e.g., wheelchair or bedside chair)?: A Lot Help needed to walk in hospital room?: Total Help needed climbing  3-5 steps with a railing? : Total 6 Click Score: 9    End of Session   Activity Tolerance: Patient tolerated treatment well Patient left: with call bell/phone within reach;with family/visitor present;in chair;with chair alarm set Nurse Communication: Mobility status PT Visit Diagnosis: Other abnormalities of gait and mobility (R26.89);Muscle weakness (generalized) (M62.81);Other symptoms and signs involving the nervous system (R29.898)     Time: 1400-1430 PT Time Calculation (min) (ACUTE ONLY): 30 min  Charges:  $Therapeutic Activity: 8-22 mins $Neuromuscular Re-education: 8-22 mins                    Marye Round, PT DPT Acute Rehabilitation Services Pager [phone]  Office [phone]    Tyrone Apple E Christain [place] 08/03/2021, 3:28 PM

## 2021-08-04 ENCOUNTER — Institutional Professional Consult (permissible substitution): Payer: Self-pay | Admitting: Internal Medicine

## 2021-08-04 LAB — BASIC METABOLIC PANEL
Anion gap: 13 (ref 5–15)
Anion gap: 10 (ref 5–15)
BUN: 52 mg/dL — ABNORMAL HIGH (ref 6–20)
BUN: 52 mg/dL — ABNORMAL HIGH (ref 6–20)
CO2: 22 mmol/L (ref 22–32)
CO2: 24 mmol/L (ref 22–32)
Calcium: 10.4 mg/dL — ABNORMAL HIGH (ref 8.9–10.3)
Calcium: 9.8 mg/dL (ref 8.9–10.3)
Chloride: 103 mmol/L (ref 98–111)
Chloride: 104 mmol/L (ref 98–111)
Creatinine, Ser: 1.45 mg/dL — ABNORMAL HIGH (ref 0.61–1.24)
Creatinine, Ser: 1.33 mg/dL — ABNORMAL HIGH (ref 0.61–1.24)
GFR, Estimated: 56 mL/min — ABNORMAL LOW (ref >60)
GFR, Estimated: >60 mL/min (ref >60)
Glucose, Bld: 110 mg/dL — ABNORMAL HIGH (ref 70–99)
Glucose, Bld: 126 mg/dL — ABNORMAL HIGH (ref 70–99)
Potassium: 4 mmol/L (ref 3.5–5.1)
Potassium: 4 mmol/L (ref 3.5–5.1)
Sodium: 138 mmol/L (ref 135–145)
Sodium: 138 mmol/L (ref 135–145)

## 2021-08-04 MED ORDER — LACTATED RINGERS IV BOLUS
1000.0000 mL | Freq: Once | INTRAVENOUS | Status: AC
  Administered 2021-08-04: 1000 mL via INTRAVENOUS

## 2021-08-04 MED ORDER — NAPHAZOLINE-GLYCERIN 0.012-0.25 % OP SOLN
1.0000 [drp] | Freq: Once | OPHTHALMIC | Status: AC
  Administered 2021-08-04: 2 [drp] via OPHTHALMIC
  Filled 2021-08-04: qty 15

## 2021-08-04 MED ORDER — BACLOFEN 10 MG PO TABS
10.0000 mg | ORAL_TABLET | Freq: Once | ORAL | Status: DC
  Filled 2021-08-04: qty 1

## 2021-08-04 MED ORDER — ONDANSETRON HCL 4 MG/2ML IJ SOLN
4.0000 mg | Freq: Once | INTRAMUSCULAR | Status: AC
  Administered 2021-08-04: 4 mg via INTRAVENOUS
  Filled 2021-08-04: qty 2

## 2021-08-04 MED ORDER — BACLOFEN 5 MG HALF TABLET
10.0000 mg | ORAL_TABLET | Freq: Three times a day (TID) | ORAL | Status: DC
  Administered 2021-08-04 – 2021-08-12 (×25): 10 mg via ORAL
  Filled 2021-08-04 (×15): qty 1
  Filled 2021-08-04: qty 2
  Filled 2021-08-04 (×9): qty 1
  Filled 2021-08-04: qty 2

## 2021-08-04 MED ORDER — HYDRALAZINE HCL 10 MG PO TABS
10.0000 mg | ORAL_TABLET | Freq: Three times a day (TID) | ORAL | Status: DC
  Administered 2021-08-04 – 2021-08-05 (×6): 10 mg via ORAL
  Filled 2021-08-04 (×6): qty 1

## 2021-08-04 NOTE — Progress Notes (Signed)
Occupational Therapy Treatment Patient Details Name: Jason Figueroa MRN: [id number] DOB: May 02, 1966 Today's Date: 08/04/2021   History of present illness 56 y.o. male presents to [facility] hospital on 07/25/2021 with numbness and tingling on left side of face along with L sided weakness. 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 PMH inlcudes HTN, previous stroke,  and recent TIA.   OT comments  Pt making steady progress towards OT goals this session. Pt continues to present with L hemiparesis, impaired balance and impaired proprioception.  pt currently requires MIN A +2 to stand to stedy, min guard for static sitting balance from EOB with RUE supported on bed rail and up to MOD A for standing balance in stedy. Worked on weight shifting in stedy with pt instructed to reach out of BOS with RUE and then return to midline with an emphasis on midline orientation with use of mirror, crossing midline and WB onto LUE/LLE for NMR. Pt would continue to benefit from skilled occupational therapy while admitted and after d/c to address the below listed limitations in order to improve overall functional mobility and facilitate independence with BADL participation. DC plan remains appropriate, will follow acutely per POC.      Recommendations for follow up therapy are one component of a multi-disciplinary discharge planning process, led by the attending physician.  Recommendations may be updated based on patient status, additional functional criteria and insurance authorization.    Follow Up Recommendations  Acute inpatient rehab (3hours/day)    Assistance Recommended at Discharge Frequent or constant Supervision/Assistance  Patient can return home with the following  A lot of help with walking and/or transfers;A lot of help with bathing/dressing/bathroom;Assistance with  cooking/housework;Direct supervision/assist for medications management;Assist for transportation;Help with stairs or ramp for entrance   Equipment Recommendations  BSC/3in1;Wheelchair cushion (measurements OT);Wheelchair (measurements OT);Tub/shower bench    Recommendations for Other Services Rehab consult    Precautions / Restrictions Precautions Precautions: Fall Precaution Comments: L hemiparesis Restrictions Weight Bearing Restrictions: No       Mobility Bed Mobility Overal bed mobility: Needs Assistance Bed Mobility: Supine to Sit     Supine to sit: +2 for physical assistance, Mod assist     General bed mobility comments: step by step cues needed to sequence steps, assitance needed to maneuver LLE and mange LUE duirng supine>sit, assist to elevate trunk    Transfers Overall transfer level: Needs assistance Equipment used: Ambulation equipment used Transfers: Sit to/from Stand, Bed to chair/wheelchair/BSC Sit to Stand: Min assist, +2 physical assistance           General transfer comment: MIN A +2 to rise from EOB and Min guard assist +2 to stand from seat of stedy, total A to pivot via stedy, mAX cues for sequencing Transfer via Lift Equipment: Stedy   Balance Overall balance assessment: Needs assistance Sitting-balance support: Single extremity supported, Feet supported Sitting balance-[name] Scale: Poor Sitting balance - Comments: L lateral lean while sitting EOB, able to self correct with RUE on bed rail but still needs min guard for safety Postural control: Left lateral lean Standing balance support: Bilateral upper extremity supported, Reliant on assistive device for balance Standing balance-[name] Scale: Poor Standing balance comment: L lean in standing, worked on dynamic reach with RUE to weight shift R and L as precursor to functional ambulation with an emphasis on returning to midline after functional reach  ADL either  performed or assessed with clinical judgement   ADL Overall ADL's : Needs assistance/impaired     Grooming: Wash/dry face;Sitting;Minimal assistance Grooming Details (indicate cue type and reason): for sitting balance from EOB                 Toilet Transfer: +2 for physical assistance;Total assistance Toilet Transfer Details (indicate cue type and reason): with stedy         Functional mobility during ADLs: Moderate assistance;+2 for physical assistance (stedy) General ADL Comments: pt continues to present with impaired sitting/standing balanace, L hemiparesis, and impaired proprioception    Extremity/Trunk Assessment Upper Extremity Assessment Upper Extremity Assessment: LUE deficits/detail LUE Deficits / Details: PROM WFL, flaccid - small trace movement at hand. able to complete AAROM with LUE sliding L<>R on stedy LUE Sensation: decreased proprioception LUE Coordination: decreased fine motor;decreased gross motor   Lower Extremity Assessment Lower Extremity Assessment: Defer to PT evaluation   Cervical / Trunk Assessment Cervical / Trunk Assessment: Normal    Vision Baseline Vision/History: 1 Wears glasses Ability to See in Adequate Light: 0 Adequate Patient Visual Report: No change from baseline Vision Assessment?: No apparent visual deficits   Perception Perception Perception: Impaired Figure Ground: depth perception issues when reach with RUE Spatial Orientation: impaired ability to spatially determine correct positioning of figures while playing tic-tac-toe on mirror   Praxis Praxis Praxis: Impaired Praxis Impairment Details: Motor planning    Cognition Arousal/Alertness: Awake/alert Behavior During Therapy: WFL for tasks assessed/performed Overall Cognitive Status: Impaired/Different from baseline Area of Impairment: Safety/judgement, Problem solving, Awareness                         Safety/Judgement: Decreased awareness of deficits,  Decreased awareness of safety Awareness: Intellectual Problem Solving: Slow processing, Requires verbal cues, Requires tactile cues General Comments: impaired awareness needing tactile cues at time for body mechanics        Exercises Other Exercises Other Exercises: horizontal shoulder ABD/ADD with LUE sliding on bar of stedy x5 reps    Shoulder Instructions       General Comments encoruaged pt to work on lifting BLEs from chair with RLE behind LLE, also encouraged pt to work on sliding LUE along arm rest of chair.    Pertinent Vitals/ Pain       Pain Assessment Pain Assessment: No/denies pain  Home Living                                          Prior Functioning/Environment              Frequency  Min 2X/week        Progress Toward Goals  OT Goals(current goals can now be found in the care plan section)  Progress towards OT goals: Progressing toward goals  Acute Rehab OT Goals Patient Stated Goal: to go to rehab OT Goal Formulation: With patient Time For Goal Achievement: 08/10/21 Potential to Achieve Goals: Good  Plan Discharge plan remains appropriate;Frequency remains appropriate    Co-evaluation                 AM-PAC OT "6 Clicks" Daily Activity     Outcome Measure   Help from another person eating meals?: A Little Help from another person taking care of personal grooming?: A Little Help from another person toileting, which includes  using toliet, bedpan, or urinal?: A Lot Help from another person bathing (including washing, rinsing, drying)?: A Lot Help from another person to put on and taking off regular upper body clothing?: A Lot Help from another person to put on and taking off regular lower body clothing?: A Lot 6 Click Score: 14    End of Session Equipment Utilized During Treatment: Gait belt;Other (comment) (stedy)  OT Visit Diagnosis: Unsteadiness on feet (R26.81);Other abnormalities of gait and mobility  (R26.89);Muscle weakness (generalized) (M62.81);Pain;Hemiplegia and hemiparesis;Feeding difficulties (R63.3) Hemiplegia - Right/Left: Left Hemiplegia - dominant/non-dominant: Non-Dominant Hemiplegia - caused by: Cerebral infarction   Activity Tolerance Patient tolerated treatment well   Patient Left in chair;with call bell/phone within reach;with chair alarm set   Nurse Communication Mobility status;Other (comment) (+2 with stedy for back to bed)        Time: [id number] OT Time Calculation (min): 27 min  Charges: OT General Charges $OT Visit: 1 Visit OT Treatments $Therapeutic Activity: 23-37 mins  Harley Alto., COTA/L Acute Rehabilitation Services [phone]   Precious Haws 08/04/2021, 11:01 AM

## 2021-08-04 NOTE — Progress Notes (Signed)
° °  Subjective Jason Figueroa  He is sleeping when I walked in during rounds. He engages in conversation and is interactive. He reports feeling well overall. No complaints of spasms at this time.   Objective:  Vital signs in last 24 hours: Vitals:   08/04/21 0350 08/04/21 0815 08/04/21 1150 08/04/21 1518  BP: 113/67 134/84 110/73 131/76  Pulse: (!) 54 (!) 56 70 74  Resp:   18 18  Temp: 97.7 F (36.5 C) 98.3 F (36.8 C) 98 F (36.7 C) 97.7 F (36.5 C)  TempSrc: Axillary Oral Oral Oral  SpO2: 94% 94% 96% 95%  Weight:      Height:       Constitutional: alert, well-appearing, in NAD  Cardiovascular: RRR, no m/r/g, no edema of  bilateral LE Pulmonary/Chest: normal work of breathing on room air, LCTAB Abdominal: soft, non-tender to palpation, non-distended MSK: normal bulk and tone,  Neurological: A&O x 3, follows commands. Persistent, stable left facial droop, dysarthria and 0/5 drift of left upper extremity and left lower extremity.    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 EEG with diffuse cortical encephalopathy and no indication for seizure. Suspect 2/2 encephalopathy given improvement with stopping Baclofen and Gabapentin. - Discontinued Gabapentin - Baclofen added yesterday, 10 mg TID  - Continue ASA 81mg  daily - Continue Clopidogrel 75mg  daily for 3 weeks - Atorvastatin 80 mg  - Tele monitoring  - PT/OT   - Denied CIR; SW assisting with SNF placement - Outpatient sleep study recommended  - Follow up in clinic with Dr in 4 weeks  - 30 day cardiac monitoring ordered   - Not  candidate for closure; OP follow up w/ Dr .  AKI   Cr improved from 1.45 -> 1.32 with IVF and stopping HCTZ. Still appears volume down, will give some more fluids and re-assess.  - 1L IVF given  - Discontinued HCTZ - Trend BMP - Avoid nephrotoxins   HTN Goal is normotension. BP at goal on current regimen.   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 Free T4 improved to 0.41 wiht IV Synthroid. Improved alertness and bradycardia mostly resolved.  - Continue with PO synthroid Pearlean Brownie   - Recheck TSH and free T4 in 4-6 weeks (between 3/9 - 3/23)   Chronic pain  Muscle spasm  Lower leg muscle spasms since his stroke. Stopped baclofen and gabapentin in s/o of acute confusion as above. Started back lower dose (Baclofen 10 mg TID) on 2/14 given ongoing spasticity.  - Start back with Baclofen 10 TID  - Stopped gabapentin 300mg  TID    Best Practice: Diet: Regular diet  IVF: None,None VTE: [facility] 40 Code: Full  10-18-1974, MD  Internal Medicine Resident, PGY-1 3/14 Internal Medicine Residency  Pager: [phone] 3:33 PM, 08/04/2021

## 2021-08-04 NOTE — Progress Notes (Signed)
Physical Therapy Treatment Patient Details Name: Jason Figueroa MRN: [id number] DOB: 25-Apr-1966 Today's Date: 08/04/2021   History of Present Illness 56 y.o. male presents to [facility] hospital on 07/25/2021 with numbness and tingling on left side of face along with L sided weakness. 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 PMH inlcudes HTN, previous stroke,  and recent TIA.    PT Comments    Pt supine in bed on entry, discussed OT/PT differences and then willing to get up in [place] again. Pt with increased fatigue this afternoon. Able to initiate coming to EoB, but increased difficulty with trunk flexion, and when given assistance becomes retropulsive. On second attempt PT provided less support posteriorly and worked on bringing R UE to [employer] and pt able to pull him self forward. Worked on sit>stand, standing balance and increased weightbearing through L UE and L LE. Pt fatigues and asks to return to bed. Able to utilize R LE to hook L LE and bring it to the bed surface. D/c plan remains appropriate at this time. PT will continue to follow acutely.     Recommendations for follow up therapy are one component of a multi-disciplinary discharge planning process, led by the attending physician.  Recommendations may be updated based on patient status, additional functional criteria and insurance authorization.  Follow Up Recommendations  Skilled nursing-short term rehab (<3 hours/day) (denied AIR)     Assistance Recommended at Discharge Frequent or constant Supervision/Assistance  Patient can return home with the following A lot of help with walking and/or transfers;A lot of help with bathing/dressing/bathroom;Assistance with cooking/housework;Assistance with feeding;Direct supervision/assist for medications management;Direct supervision/assist for financial  management;Assist for transportation;Help with stairs or ramp for entrance   Equipment Recommendations  Wheelchair (measurements PT);Wheelchair cushion (measurements PT);Hospital bed    Recommendations for Other Services       Precautions / Restrictions Precautions Precautions: Fall Precaution Comments: L hemiparesis Restrictions Weight Bearing Restrictions: No     Mobility  Bed Mobility Overal bed mobility: Needs Assistance Bed Mobility: Supine to Sit     Supine to sit: Total assist, Max assist Sit to supine: Mod assist   General bed mobility comments: total A for initial attempt secondary to increased posterior lean and retropulsion,on next session    Transfers Overall transfer level: Needs assistance Equipment used: Ambulation equipment used Transfers: Sit to/from Stand Sit to Stand: Mod assist           General transfer comment: modA for powerup to standing once in standing able to maintain balance with minA-min guard A Transfer via Lift Equipment: Stedy     Modified Rankin (Stroke Patients Only) Modified Rankin (Stroke Patients Only) Pre-Morbid Rankin Score: No symptoms Modified Rankin: Severe disability     Balance Overall balance assessment: Needs assistance Sitting-balance support: Single extremity supported, Feet supported Sitting balance-[name] Scale: Poor Sitting balance - Comments: L lateral and posterior leaning especially with fatigue Postural control: Left lateral lean Standing balance support: Bilateral upper extremity supported, Reliant on assistive device for balance Standing balance-[name] Scale: Poor Standing balance comment: able to stand with B UE support at [facility] without additional assist for bouts of 10-15 sec                            Cognition Arousal/Alertness: Awake/alert Behavior During Therapy: Impulsive (continues to have slight impulsiveness  but has better command follow) Overall Cognitive Status:  Impaired/Different from baseline Area of Impairment: Safety/judgement, Following commands, Problem solving                       Following Commands: Follows one step commands consistently Safety/Judgement: Decreased awareness of deficits, Decreased awareness of safety Awareness: Intellectual   General Comments: follows one-step commands consistently, requires mod cuing to adjust posture repeatedly and attend to L.        Exercises General Exercises - Lower Extremity Long Arc Quad:  (with quad tapping, no quad activation observed) Other Exercises Other Exercises: weightbearing through UE on Stedy bar    General Comments General comments (skin integrity, edema, etc.): VSS on Ra      Pertinent Vitals/Pain Pain Assessment Pain Assessment: No/denies pain Faces Pain Scale: No hurt     PT Goals (current goals can now be found in the care plan section) Acute Rehab PT Goals PT Goal Formulation: With patient Time For Goal Achievement: 08/10/21 Potential to Achieve Goals: Good Progress towards PT goals: Progressing toward goals    Frequency    Min 3X/week      PT Plan Current plan remains appropriate    Co-evaluation              AM-PAC PT "6 Clicks" Mobility   Outcome Measure  Help needed turning from your back to your side while in a flat bed without using bedrails?: A Lot Help needed moving from lying on your back to sitting on the side of a flat bed without using bedrails?: Total Help needed moving to and from a bed to a chair (including a wheelchair)?: Total Help needed standing up from a chair using your arms (e.g., wheelchair or bedside chair)?: A Lot Help needed to walk in hospital room?: Total Help needed climbing 3-5 steps with a railing? : Total 6 Click Score: 8    End of Session Equipment Utilized During Treatment: Gait belt Activity Tolerance: Patient tolerated treatment well Patient left: with call bell/phone within reach;with  family/visitor present;in chair;with chair alarm set Nurse Communication: Mobility status PT Visit Diagnosis: Other abnormalities of gait and mobility (R26.89);Muscle weakness (generalized) (M62.81);Other symptoms and signs involving the nervous system (W29.937)     Time: 1696-7893 PT Time Calculation (min) (ACUTE ONLY): 26 min  Charges:  $Therapeutic Exercise: 8-22 mins $Therapeutic Activity: 8-22 mins                     Wally Behan B. Beverely Risen PT, DPT Acute Rehabilitation Services Pager [phone] Office [phone]    Elon Alas Fleet 08/04/2021, 5:15 PM

## 2021-08-04 NOTE — Plan of Care (Signed)
°  Problem: Education: Goal: Knowledge of disease or condition will improve Outcome: Progressing Goal: Knowledge of secondary prevention will improve (SELECT ALL) Outcome: Progressing Goal: Knowledge of patient specific risk factors will improve (INDIVIDUALIZE FOR PATIENT) Outcome: Progressing Goal: Individualized Educational Video(s) Outcome: Progressing   Problem: Coping: Goal: Will verbalize positive feelings about self Outcome: Progressing Goal: Will identify appropriate support needs Outcome: Progressing   Problem: Ischemic Stroke/TIA Tissue Perfusion: Goal: Complications of ischemic stroke/TIA will be minimized Outcome: Progressing   Problem: Coping: Goal: Level of anxiety will decrease Outcome: Progressing

## 2021-08-05 LAB — BASIC METABOLIC PANEL
Anion gap: 10 (ref 5–15)
BUN: 55 mg/dL — ABNORMAL HIGH (ref 6–20)
CO2: 24 mmol/L (ref 22–32)
Calcium: 9.6 mg/dL (ref 8.9–10.3)
Chloride: 104 mmol/L (ref 98–111)
Creatinine, Ser: 1.32 mg/dL — ABNORMAL HIGH (ref 0.61–1.24)
GFR, Estimated: >60 mL/min (ref >60)
Glucose, Bld: 121 mg/dL — ABNORMAL HIGH (ref 70–99)
Potassium: 3.8 mmol/L (ref 3.5–5.1)
Sodium: 138 mmol/L (ref 135–145)

## 2021-08-05 MED ORDER — LACTATED RINGERS IV BOLUS
1000.0000 mL | Freq: Once | INTRAVENOUS | Status: AC
  Administered 2021-08-05: 1000 mL via INTRAVENOUS

## 2021-08-05 NOTE — Progress Notes (Signed)
Subjective No acute overnight events  Pt is seen at bedside during rounds today. He is sleeping when we walk in. He is more interactive and talkative than usual. He reports feeling well overall, but is ready "to get out of this hospital". He understands the importance of continuing to work with PT/OT. He is is good spirits overall.   Objective:  Vital signs in last 24 hours: Vitals:   08/05/21 0317 08/05/21 0505 08/05/21 0720 08/05/21 1201  BP: 104/76 137/81 127/79 125/82  Pulse: (!) 49 60 63 60  Resp: 12 12 13 14   Temp: 98 F (36.7 C) 98.3 F (36.8 C) 97.6 F (36.4 C) 98 F (36.7 C)  TempSrc: Oral Oral Oral Oral  SpO2: 95% 96% 97% 100%  Weight:      Height:       Constitutional: alert, well-appearing, in NAD  Cardiovascular: RRR, no m/r/g, no edema of bilateral LE Pulmonary/Chest: normal work of breathing on room air, LCTAB Abdominal: soft, non-tender to palpation, non-distended MSK: normal bulk and tone,  Neurological: A&O x 3, follows commands. Persistent, stable left facial droop, dysarthria and 0/5 drift of left upper extremity and left lower extremity.    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 Not candidate for PFO closure; plan for OP follow up with Dr 09/30/21. 30 day cardiac monitoring ordered for time of DC. Will follow up with Dr Excell Seltzer in about 4 weeks as an OP.  New small acute cortical infarct found on MRI on 2/12 after pt was found to be altered. Stroke team was re-consulted, and believe acute change was acute toxic encephalopathy from recent addition of Baclofen for spasticity s/p infarctions. Mental status improved with cessation. Baclofen started back at reduced dose  d/t ongoing spasticity; no further changes in mental status or complaints of muscle spasm. Continues to work with PT, but left sided deficits persist. Remains stable for DC at this time.   - Continue with Baclofen 10 mg TID - Continue ASA 81mg  daily - Continue Clopidogrel 75mg  daily for 3 weeks - Atorvastatin 80 mg  - Tele monitoring personally reviewed today  - PT/OT following  - Denied CIR; SW assisting with SNF placement - Outpatient sleep study recommended   AKI, resolved  Cr improved from 1.45 -> 1.32 -> 1.17 with IVF x 2 days (does not like drinking water) and stopping HCTZ. Is euvolemic on exam today. - Trend BMP - Discontinued HCTZ - Avoid nephrotoxins   HTN Goal is normotension. Initially difficult to manage, but now at goal on current regimen. 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 Free T4 improved to 0.41 wiht IV Synthroid. Improved alertness and bradycardia mostly resolved.  - Continue with PO synthroid 4/12   - Recheck TSH and free T4 in 4-6 weeks (3/9 - 3/23)    Best Practice: Diet: Regular diet  IVF: None,None VTE: [facility] 40 Code: Full  , MD  Internal Medicine Resident, PGY-1 06-04-2006 Internal Medicine Residency  Pager: [phone] 2:03 PM, 08/05/2021

## 2021-08-05 NOTE — Progress Notes (Signed)
Occupational Therapy Treatment Patient Details Name: NOAH LEMBKE MRN: [id number] DOB: 07-23-1965 Today's Date: 08/05/2021   History of present illness 56 y.o. male presents to [facility] hospital on 07/25/2021 with numbness and tingling on left side of face along with L sided weakness. 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 PMH inlcudes HTN, previous stroke,  and recent TIA.   OT comments  [name] is progressing incrementally. He continues to be limited by L hemiparesis, impaired balance and proprioception. Pt requires constant cues for compensatory techniques and L attention with limited carry over noted. Focused on unsupported midline balance, but ultimately pt continues to required at least min A to correct L lateral lean/LOB. Mod A for transfers this session with L knee blocking and cues for appropriate body positioning. D/c updated to SNF as AIR has declined. OT to continue to follow acutely.   Recommendations for follow up therapy are one component of a multi-disciplinary discharge planning process, led by the attending physician.  Recommendations may be updated based on patient status, additional functional criteria and insurance authorization.    Follow Up Recommendations  Skilled nursing-short term rehab (<3 hours/day) (AIR denied)    Assistance Recommended at Discharge Frequent or constant Supervision/Assistance  Patient can return home with the following  A lot of help with walking and/or transfers;A lot of help with bathing/dressing/bathroom;Assistance with cooking/housework;Direct supervision/assist for medications management;Assist for transportation;Help with stairs or ramp for entrance   Equipment Recommendations  BSC/3in1;Wheelchair cushion (measurements OT);Wheelchair (measurements OT);Tub/shower bench       Precautions / Restrictions  Precautions Precautions: Fall Precaution Comments: L hemiparesis Restrictions Weight Bearing Restrictions: No       Mobility Bed Mobility Overal bed mobility: Needs Assistance       Supine to sit: Max assist     General bed mobility comments: cues for hooking method.    Transfers Overall transfer level: Needs assistance Equipment used: 1 person hand held assist Transfers: Sit to/from Stand, Bed to chair/wheelchair/BSC Sit to Stand: Mod assist Stand pivot transfers: Mod assist         General transfer comment: LLE blocking, cues, balance     Balance Overall balance assessment: Needs assistance Sitting-balance support: Single extremity supported, Feet supported Sitting balance-[name] Scale: Poor Sitting balance - Comments: L lateral and posterior leaning especially with fatigue   Standing balance support: Bilateral upper extremity supported, Reliant on assistive device for balance Standing balance-[name] Scale: Poor         ADL either performed or assessed with clinical judgement   ADL Overall ADL's : Needs assistance/impaired     Grooming: Wash/dry face;Sitting;Minimal assistance Grooming Details (indicate cue type and reason): for sitting balance from EOB                 Toilet Transfer: Stand-pivot;BSC/3in1;Moderate assistance Toilet Transfer Details (indicate cue type and reason): simulated. bed>chair. assist for cues, LLE blocking         Functional mobility during ADLs: Moderate assistance General ADL Comments: pt continues to present with impaired sitting/standing balanace, L hemiparesis, and impaired proprioception    Extremity/Trunk Assessment Upper Extremity Assessment Upper Extremity Assessment: LUE deficits/detail LUE Deficits / Details: PROM WFL, flaccid - small trace movement at hand. able to complete AAROM with LUE sliding L<>R on stedy LUE Sensation: decreased proprioception LUE Coordination: decreased fine motor;decreased gross  motor   Lower Extremity Assessment Lower Extremity  Assessment: Defer to PT evaluation        Vision   Vision Assessment?: No apparent visual deficits Additional Comments: L inattention?   Perception Perception Perception: Impaired   Praxis Praxis Praxis: Impaired    Cognition Arousal/Alertness: Awake/alert Behavior During Therapy: Impulsive Overall Cognitive Status: Impaired/Different from baseline Area of Impairment: Safety/judgement, Following commands, Problem solving                       Following Commands: Follows one step commands consistently Safety/Judgement: Decreased awareness of deficits, Decreased awareness of safety Awareness: Intellectual Problem Solving: Slow processing, Requires verbal cues, Requires tactile cues General Comments: follows one-step commands consistently, requires mod cuing to adjust posture repeatedly and attend to L.Very limited carryover noted              General Comments VSS on RA    Pertinent Vitals/ Pain       Pain Assessment Pain Assessment: No/denies pain   Frequency  Min 2X/week        Progress Toward Goals  OT Goals(current goals can now be found in the care plan section)  Progress towards OT goals: Progressing toward goals  Acute Rehab OT Goals Patient Stated Goal: go to rehab OT Goal Formulation: With patient Time For Goal Achievement: 08/10/21 Potential to Achieve Goals: Good ADL Goals Pt Will Perform Grooming: with set-up;sitting Pt Will Perform Upper Body Dressing: with set-up;sitting Pt Will Perform Lower Body Dressing: with min guard assist;sit to/from stand Pt Will Transfer to Toilet: with min guard assist;stand pivot transfer;bedside commode Additional ADL Goal #1: pt will independently complete bed mobility as a precursor to ADLs  Plan Discharge plan remains appropriate;Frequency remains appropriate       AM-PAC OT "6 Clicks" Daily Activity     Outcome Measure   Help from another  person eating meals?: A Little Help from another person taking care of personal grooming?: A Little Help from another person toileting, which includes using toliet, bedpan, or urinal?: A Lot Help from another person bathing (including washing, rinsing, drying)?: A Lot Help from another person to put on and taking off regular upper body clothing?: A Lot Help from another person to put on and taking off regular lower body clothing?: A Lot 6 Click Score: 14    End of Session Equipment Utilized During Treatment: Gait belt  OT Visit Diagnosis: Unsteadiness on feet (R26.81);Other abnormalities of gait and mobility (R26.89);Muscle weakness (generalized) (M62.81);Pain;Hemiplegia and hemiparesis;Feeding difficulties (R63.3) Hemiplegia - Right/Left: Left Hemiplegia - dominant/non-dominant: Non-Dominant Hemiplegia - caused by: Cerebral infarction   Activity Tolerance Patient tolerated treatment well   Patient Left in chair;with call bell/phone within reach;with chair alarm set   Nurse Communication Mobility status;Other (comment)        Time: 2482-5003 OT Time Calculation (min): 17 min  Charges: OT General Charges $OT Visit: 1 Visit OT Treatments $Therapeutic Activity: 8-22 mins   Sirius Woodford A Tyshawn Keel 08/05/2021, 2:04 PM

## 2021-08-05 NOTE — TOC Progression Note (Signed)
Transition of Care [facility]) - Progression Note    Patient Details  Name: MALIKHI OGAN MRN: [id number] Date of Birth: 1965/07/04  Transition of Care [facility]) CM/SW [place], [place] Phone Number: 08/05/2021, 1:58 PM  Clinical Narrative:   CSW called by RN that patient was requesting to speak with CSW. CSW met with patient, and he had concerns about Medicaid and Disability. Per patient, someone had met with him a couple of days ago and discussed but he wasn't sure what was going on. Patient says he has a pending disability application in [place] that he wasn't sure what to do with. CSW contacted financial counselor to update about pending disability in [facility] and if patient needs to do anything to cancel or transfer that application, awaiting response. CSW to follow.    Expected Discharge Plan: [place] Barriers to Discharge: Inadequate or no insurance, SNF Pending bed offer, SNF Pending payor source - LOG, SNF Pending Medicaid  Expected Discharge Plan and Services Expected Discharge Plan: [place] Choice: [place] arrangements for the past 2 months: Single Family Home                                       Social Determinants of Health (SDOH) Interventions    Readmission Risk Interventions No flowsheet data found.

## 2021-08-06 LAB — BASIC METABOLIC PANEL
Anion gap: 11 (ref 5–15)
BUN: 35 mg/dL — ABNORMAL HIGH (ref 6–20)
CO2: 23 mmol/L (ref 22–32)
Calcium: 9.1 mg/dL (ref 8.9–10.3)
Chloride: 105 mmol/L (ref 98–111)
Creatinine, Ser: 1.17 mg/dL (ref 0.61–1.24)
GFR, Estimated: >60 mL/min (ref >60)
Glucose, Bld: 108 mg/dL — ABNORMAL HIGH (ref 70–99)
Potassium: 4.2 mmol/L (ref 3.5–5.1)
Sodium: 139 mmol/L (ref 135–145)

## 2021-08-06 MED ORDER — HYDRALAZINE HCL 10 MG PO TABS
10.0000 mg | ORAL_TABLET | Freq: Four times a day (QID) | ORAL | Status: DC
  Administered 2021-08-06 – 2021-09-11 (×142): 10 mg via ORAL
  Filled 2021-08-06 (×144): qty 1

## 2021-08-06 NOTE — Progress Notes (Signed)
° °  Subjective No acute overnight events  Pt is seen at bedside during rounds today. He is sleeping when we walk in. He remains interactive and talkative compared to last week. He understands the importance of continuing to work with PT/OT. He remains is good spirits overall.   Objective:  Vital signs in last 24 hours: Vitals:   08/05/21 2334 08/06/21 0338 08/06/21 0838 08/06/21 1201  BP: (!) 139/94 135/73 136/76 (!) 152/71  Pulse: 70 60 (!) 54 72  Resp: 17 14 16 18   Temp: 98.4 F (36.9 C) 97.6 F (36.4 C) 98 F (36.7 C) 97.8 F (36.6 C)  TempSrc: Oral Oral Oral Oral  SpO2: 96% 98% 96% 98%  Weight:      Height:       Constitutional: alert, well-appearing, in NAD  Cardiovascular: RRR, no m/r/g, no edema of bilateral LE Pulmonary/Chest: normal work of breathing on room air, LCTAB Abdominal: soft, non-tender to palpation, non-distended MSK: normal bulk and tone,  Neurological: A&O x 3, follows commands. Persistent, stable left facial droop, dysarthria and 0/5 drift of left upper extremity and left lower extremity.    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 Not candidate for PFO, 30 day cardiac monitoring ordered, and will f/u OP with Dr 09/30/21 and Dr Excell Seltzer. Encephalopathy resolved with dose reduction of baclofen. No further changes in mental status or complaints of muscle spasm. Continues to work with PT, but left sided deficits persist. Remains stable for DC at this time, pending placement.    - Continue with Baclofen 10 mg TID - Continue ASA 81mg  daily - Continue Clopidogrel 75mg  daily for 3 weeks - Atorvastatin 80 mg  - Tele monitoring personally reviewed today  - PT/OT following   - Denied CIR; SW assisting with SNF placement - Outpatient sleep study recommended   AKI, resolved  Cr stable at 1 after stopping HCTZ and IVF. - Discontinued HCTZ - Avoid nephrotoxins   HTN Goal is normotension. Initially difficult to manage, but now near goal on current regimen. 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 Free T4 improved to 0.41 wiht IV Synthroid. Improved alertness and bradycardia mostly resolved.  - Continue with PO synthroid Pearlean Brownie   - Recheck TSH and free T4 in 4-6 weeks (3/9 - 3/23)    Best Practice: Diet: Regular diet  IVF: None,None VTE: [facility] 40 Code: Full  , MD  Internal Medicine Resident, PGY-1 06-04-2006 Internal Medicine Residency  Pager: [phone] 2:10 PM, 08/06/2021

## 2021-08-06 NOTE — Progress Notes (Signed)
Physical Therapy Treatment Patient Details Name: Jason Figueroa MRN: [id number] DOB: [birth year] Today's Date: 08/06/2021   History of Present Illness 56 y.o. male presents to [facility] hospital on 07/25/2021 with numbness and tingling on left side of face along with L sided weakness. 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 PMH inlcudes HTN, previous stroke,  and recent TIA.    PT Comments    Pt admitted with above diagnosis. Pt was able to stand to [place] with mod assist with multiple stands and standing activities in [place] with pt tolerating well.  Pt progressing slowly and is gaining strength in left LE with notable quad activation and hip extension when standing. Pt still cannot move the left LE without assist. Pt continues to need SNF.  Pt currently with functional limitations due to balance and endurance deficits. Pt will benefit from skilled PT to increase their independence and safety with mobility to allow discharge to the venue listed below.      Recommendations for follow up therapy are one component of a multi-disciplinary discharge planning process, led by the attending physician.  Recommendations may be updated based on patient status, additional functional criteria and insurance authorization.  Follow Up Recommendations  Skilled nursing-short term rehab (<3 hours/day) (denied AIR)     Assistance Recommended at Discharge Frequent or constant Supervision/Assistance  Patient can return home with the following A lot of help with walking and/or transfers;A lot of help with bathing/dressing/bathroom;Assistance with cooking/housework;Assistance with feeding;Direct supervision/assist for medications management;Direct supervision/assist for financial management;Assist for transportation;Help with stairs or ramp for entrance   Equipment Recommendations   Wheelchair (measurements PT);Wheelchair cushion (measurements PT);Hospital bed    Recommendations for Other Services       Precautions / Restrictions Precautions Precautions: Fall Precaution Comments: L hemiparesis Restrictions Weight Bearing Restrictions: No     Mobility  Bed Mobility Overal bed mobility: Needs Assistance Bed Mobility: Supine to Sit Rolling: Min assist Sidelying to sit: Mod assist       General bed mobility comments: cues for hooking method but still needs assist as foot is heavy for pt to move on his own as well as some assist for trunk to EOB.  Also assist to get balance once on EOB as initially leaning to his right.    Transfers Overall transfer level: Needs assistance Equipment used: 1 person hand held assist Transfers: Sit to/from Stand, Bed to chair/wheelchair/BSC Sit to Stand: Mod assist           General transfer comment: LLE blocking.  Assist to hold left UE onto [facility]. Pt is able to stand with mod assist from the bed to the [facility] and then pads placed under pt. Moved pt to the recliner and then pt worked on standing from the [place] 5 x total needing min assist from this surface to stand and min assist to place left UE on [place]. Pt then worked on squats and weight shifting right and left with pt trying to pick up his LEs to prep for ambulation. Transfer via Lift Equipment: Stedy  Ambulation/Gait Ambulation/Gait assistance:  (deferred 2/2 high falls risk)             General Gait Details: not ready yet.   Stairs             Wheelchair Mobility    Modified Rankin (Stroke Patients Only) Modified Rankin (Stroke Patients Only)  Pre-Morbid Rankin Score: No symptoms Modified Rankin: Severe disability     Balance Overall balance assessment: Needs assistance Sitting-balance support: Single extremity supported, Feet supported Sitting balance-[name] Scale: Poor Sitting balance - Comments: L lateral and posterior leaning especially with  fatigue Postural control: Left lateral lean Standing balance support: Bilateral upper extremity supported, Reliant on assistive device for balance Standing balance-[name] Scale: Poor Standing balance comment: able to stand with B UE support at [facility] without additional assist for up to 2 min                            Cognition Arousal/Alertness: Awake/alert Behavior During Therapy: Impulsive Overall Cognitive Status: Impaired/Different from baseline Area of Impairment: Safety/judgement, Following commands, Problem solving                       Following Commands: Follows one step commands consistently Safety/Judgement: Decreased awareness of deficits, Decreased awareness of safety Awareness: Intellectual Problem Solving: Slow processing, Requires verbal cues, Requires tactile cues General Comments: follows one-step commands consistently, requires mod cuing to adjust posture repeatedly and attend to L.Very limited carryover noted        Exercises General Exercises - Lower Extremity Long Arc Quad:  (with quad tapping, no quad activation observed) Hip Flexion/Marching: Left, 5 reps, Seated, PROM Other Exercises Other Exercises: weightbearing through UE on Stedy bar Other Exercises: Pt performed squats trying to get buttocks close to [facility] seat and then back to standing performing 10 reps x 2 each Other Exercises: Weight shifting right and left Other Exercises: weight shifting right and left and picking up right and left feet    General Comments General comments (skin integrity, edema, etc.): VSS on RA      Pertinent Vitals/Pain Pain Assessment Faces Pain Scale: No hurt    Home Living                          Prior Function            PT Goals (current goals can now be found in the care plan section) Acute Rehab PT Goals Patient Stated Goal: to regain strength and independence Progress towards PT goals: Progressing toward goals     Frequency    Min 3X/week      PT Plan Current plan remains appropriate    Co-evaluation              AM-PAC PT "6 Clicks" Mobility   Outcome Measure  Help needed turning from your back to your side while in a flat bed without using bedrails?: A Lot Help needed moving from lying on your back to sitting on the side of a flat bed without using bedrails?: A Lot Help needed moving to and from a bed to a chair (including a wheelchair)?: A Lot Help needed standing up from a chair using your arms (e.g., wheelchair or bedside chair)?: A Lot Help needed to walk in hospital room?: Total Help needed climbing 3-5 steps with a railing? : Total 6 Click Score: 10    End of Session Equipment Utilized During Treatment: Gait belt Activity Tolerance: Patient tolerated treatment well;Patient limited by fatigue Patient left: with call bell/phone within reach;in chair;with chair alarm set Nurse Communication: Mobility status PT Visit Diagnosis: Other abnormalities of gait and mobility (R26.89);Muscle weakness (generalized) (M62.81);Other symptoms and signs involving the nervous system [id number])     Time: [id number] PT  Time Calculation (min) (ACUTE ONLY): 21 min  Charges:  $Therapeutic Activity: 8-22 mins                     Elliona Doddridge M,PT Acute Rehab Services [phone] (pager)    Alvira Philips 08/06/2021, 2:01 PM

## 2021-08-07 DIAGNOSIS — N179 Acute kidney failure, unspecified: Secondary | ICD-10-CM

## 2021-08-07 LAB — BASIC METABOLIC PANEL
Anion gap: 9 (ref 5–15)
BUN: 27 mg/dL — ABNORMAL HIGH (ref 6–20)
CO2: 25 mmol/L (ref 22–32)
Calcium: 9.3 mg/dL (ref 8.9–10.3)
Chloride: 106 mmol/L (ref 98–111)
Creatinine, Ser: 0.96 mg/dL (ref 0.61–1.24)
GFR, Estimated: >60 mL/min (ref >60)
Glucose, Bld: 109 mg/dL — ABNORMAL HIGH (ref 70–99)
Potassium: 3.9 mmol/L (ref 3.5–5.1)
Sodium: 140 mmol/L (ref 135–145)

## 2021-08-07 NOTE — Plan of Care (Signed)
  Problem: Education: Goal: Knowledge of disease or condition will improve Outcome: Progressing   

## 2021-08-08 DIAGNOSIS — G4733 Obstructive sleep apnea (adult) (pediatric): Secondary | ICD-10-CM

## 2021-08-08 DIAGNOSIS — N179 Acute kidney failure, unspecified: Secondary | ICD-10-CM

## 2021-08-08 NOTE — Progress Notes (Signed)
° °  Subjective No acute overnight events  Pt is seen at bedside during rounds today. He understands the importance of continuing to work with PT/OT. He remains is good spirits overall. He has not acute complaints.   Objective:  Vital signs in last 24 hours: Vitals:   08/08/21 0408 08/08/21 0721 08/08/21 1100 08/08/21 1200  BP: (!) 152/84 136/66 135/73   Pulse: 60 67 69 69  Resp: 20 20 20 14   Temp: 98.2 F (36.8 C) 98.2 F (36.8 C) 98.5 F (36.9 C)   TempSrc: Oral Oral Oral   SpO2: 100% 97% 97% 95%  Weight:      Height:       Constitutional: alert, well-appearing, in NAD  Cardiovascular: RRR, no m/r/g, no edema of bilateral LE Pulmonary/Chest: normal work of breathing on room air, LCTAB Abdominal: soft, non-tender to palpation, non-distended MSK: normal bulk and tone,  Neurological: A&O x 3, follows commands. Persistent, stable left facial droop, dysarthria and 0/5 drift of left upper extremity and left lower extremity.    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 Not candidate for PFO, 30 day cardiac monitoring ordered, and will f/u OP with Dr Burt Knack and Dr Leonie Man. Encephalopathy resolved with dose reduction of baclofen. No further changes in mental status or complaints of muscle spasm. Continues to work with PT, but left sided deficits persist. Remains stable for DC at this time, pending placement.    - Continue with Baclofen 10 mg TID - Continue ASA 81mg  daily - Continue Clopidogrel 75mg  daily for 3 weeks - Atorvastatin 80 mg  - Tele monitoring personally reviewed today  - PT/OT following  - Denied CIR; SW assisting with SNF placement - Outpatient  sleep study recommended   AKI, resolved  Cr stable after stopping HCTZ  - Discontinued HCTZ - Avoid nephrotoxins  - BMP every other day   HTN Goal is normotension. Initially difficult to manage, but now near goal on current regimen. 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 Free T4 improved to 0.41 with PO synthroid, that was briefly converted to IV synthroid. Improved alertness and bradycardia resolved.  - Continue with PO synthroid 134mcg   - Recheck TSH and free T4 in 4-6 weeks (3/9 - 3/23)    Best Practice: Diet: Regular diet  IVF: None,None VTE: Levenox 40 Code: Full  Rick Duff, MD PGY-2 Internal Medicine  Pager [phone] 1:14 PM, 08/08/2021

## 2021-08-08 NOTE — Plan of Care (Signed)
°  Problem: Coping: Goal: Will verbalize positive feelings about self Outcome: Progressing   Problem: Self-Care: Goal: Ability to participate in self-care as condition permits will improve Outcome: Progressing   Problem: Elimination: Goal: Will not experience complications related to bowel motility Outcome: Progressing   Problem: Safety: Goal: Ability to remain free from injury will improve Outcome: Progressing

## 2021-08-09 DIAGNOSIS — G4733 Obstructive sleep apnea (adult) (pediatric): Secondary | ICD-10-CM | POA: Insufficient documentation

## 2021-08-09 NOTE — Progress Notes (Signed)
° °  Subjective No acute overnight events  Pt is seen at bedside during rounds today. He has not acute complaints. He is eager to help his mother at home and get to a rehab facility.   Objective:  Vital signs in last 24 hours: Vitals:   08/09/21 0956 08/09/21 1257 08/09/21 1300 08/09/21 1337  BP: 129/75 133/82 133/82 133/82  Pulse:   63   Resp:   14   Temp:  98.3 F (36.8 C)    TempSrc:  Oral    SpO2:   95%   Weight:      Height:       Constitutional: alert, well-appearing, in NAD  Cardiovascular: RRR, no m/r/g, no edema of bilateral LE Pulmonary/Chest: normal work of breathing on room air, LCTAB Abdominal: soft, non-tender to palpation, non-distended MSK: normal bulk and tone,  Neurological: A&O x 3, follows commands. Persistent, stable left facial droop, dysarthria and 0/5 drift of left upper extremity and left lower extremity.  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 Not candidate for PFO, 30 day cardiac monitoring ordered, and will f/u OP with Dr Excell Seltzer and Dr Pearlean Brownie. Encephalopathy resolved with dose reduction of baclofen. No further changes in mental status or complaints of muscle spasm. Continues to work with PT, but left sided deficits persist. Remains stable for DC at this time, pending placement.    - Continue with Baclofen 10 mg TID - Continue ASA 81mg  daily - Continue Clopidogrel 75mg  daily for 3 weeks - Atorvastatin 80 mg  - PT/OT following  - Denied CIR; SW assisting with SNF placement - Outpatient sleep study recommended   AKI, resolved  Cr stable after stopping HCTZ  -  Discontinued HCTZ - Avoid nephrotoxins  - BMP every other day   HTN Goal is normotension. Initially difficult to manage, but now near goal on current regimen. 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 Free T4 improved to 0.41 with PO synthroid, that was briefly converted to IV synthroid. Improved alertness and bradycardia resolved.  - Continue with PO synthroid   - Recheck TSH and free T4 in 4-6 weeks (3/9 - 3/23)    Best Practice: Diet: Regular diet  IVF: None,None VTE: Levenox 40 Code: Full  06-04-2006, MD PGY-2 Internal Medicine  Pager [phone] 3:21 PM, 08/09/2021

## 2021-08-09 NOTE — Progress Notes (Addendum)
° °  Subjective Overnight events: found to be using a nasal spray from home not on med list. Did not want to try hospital alternative.  Pt is seen at bedside during rounds today. He has not acute complaints. Reports using Afrin chronically. Counseled on not using Afrin as it can cause HTN; he is agreeable to trying alternative. Continues to work with PT and is ready to get to a rehab facility.   Objective:  Vital signs in last 24 hours: Vitals:   08/09/21 2004 08/09/21 2328 08/10/21 0315 08/10/21 0906  BP: 110/72 137/73 110/77 132/78  Pulse: 66 60 (!) 53 63  Resp: 16 18 16 18   Temp: 98.2 F (36.8 C) 97.9 F (36.6 C) 98 F (36.7 C) 98.5 F (36.9 C)  TempSrc: Oral Oral Oral Oral  SpO2: 99% 92% 97% 99%  Weight:      Height:       Constitutional: alert, well-appearing, in NAD  Cardiovascular: RRR, no m/r/g, no edema of bilateral LE Pulmonary/Chest: normal work of breathing on room air  Abdominal: soft, non-tender to palpation, non-distended Neurological: A&O x 3, follows commands. Persistent, stable left facial droop, dysarthria and 0/5 drift of left upper extremity and left lower extremity. 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 No further encephalopathy with reduced dose of baclofen. Working with PT; stable left sided deficits persist. Remains stable for DC at this time, pending placement. - Baclofen 10 mg TID - ASA 81mg  daily - Clopidogrel 75mg  daily for 3 weeks - Atorvastatin 80 mg  - PT/OT following  - Denied CIR; SW assisting with  SNF placement - Outpatient sleep study recommended   Allergic rhinitis  Reports using Afrin spray chronically. Found to be using overnight and confiscated by Pearlean Brownie. Consulted pt to refrain from this given risk for HTN.  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 Free T4 improved to 0.41 with PO synthroid, that was briefly converted to IV synthroid. Improved alertness and bradycardia resolved.  - Continue PO synthroid   - Recheck TSH and free T4 in 4-6 weeks (3/9 - 3/23)   Dry eyes - Artificial tears   Best Practice: Diet: Regular diet  IVF: None,None VTE: Levenox 40 Code: Full   , MD  Internal Medicine Resident, PGY-1 06-04-2006 Internal Medicine Residency  Pager: [phone]

## 2021-08-10 MED ORDER — SALINE SPRAY 0.65 % NA SOLN
1.0000 | NASAL | Status: DC | PRN
  Administered 2021-08-13 – 2021-08-19 (×3): 1 via NASAL
  Filled 2021-08-10: qty 44

## 2021-08-10 MED ORDER — ARTIFICIAL TEARS OPHTHALMIC OINT
TOPICAL_OINTMENT | Freq: Every day | OPHTHALMIC | Status: DC | PRN
  Filled 2021-08-10: qty 3.5

## 2021-08-10 MED ORDER — LORATADINE 10 MG PO TABS
10.0000 mg | ORAL_TABLET | Freq: Every day | ORAL | Status: DC
  Administered 2021-08-10 – 2021-09-11 (×33): 10 mg via ORAL
  Filled 2021-08-10 (×33): qty 1

## 2021-08-10 MED ORDER — FLUTICASONE PROPIONATE 50 MCG/ACT NA SUSP
2.0000 | Freq: Every day | NASAL | Status: DC
  Administered 2021-08-10 – 2021-08-22 (×11): 2 via NASAL
  Filled 2021-08-10: qty 16

## 2021-08-10 NOTE — Progress Notes (Signed)
° °  Subjective Overnight events: none   Pt is seen at bedside during rounds today. He is asking information about his PFO. Continues to work with PT and is ready to get to a rehab facility. Has not noticed improvement in strength yet.   Objective:  Vital signs in last 24 hours: Vitals:   08/09/21 2004 08/09/21 2328 08/10/21 0315 08/10/21 0906  BP: 110/72 137/73 110/77 132/78  Pulse: 66 60 (!) 53 63  Resp: 16 18 16 18   Temp: 98.2 F (36.8 C) 97.9 F (36.6 C) 98 F (36.7 C) 98.5 F (36.9 C)  TempSrc: Oral Oral Oral Oral  SpO2: 99% 92% 97% 99%  Weight:      Height:       Constitutional: alert, well-appearing, in NAD  Cardiovascular: RRR, no m/r/g, no edema of bilateral LE Pulmonary/Chest: normal work of breathing on room air  Abdominal: soft, non-tender to palpation, non-distended Neurological: A&O x 3, follows commands. Persistent, improved left facial droop, dysarthria and 0/5 drift of left upper extremity and left lower extremity. 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 No further encephalopathy with reduced dose of baclofen. Working with PT; stable left sided deficits persist. Remains stable for DC at this time, pending placement. 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 Free T4 improved to 0.41 with PO synthroid, that was briefly converted to IV synthroid. Improved alertness and bradycardia resolved.  - Continue PO synthroid Pearlean Brownie   - Recheck TSH and free T4 in 4-6 weeks (3/9 - 3/23)   Dry eyes - Artificial tears   Best Practice: Diet: Regular diet  IVF: None,None VTE: Levenox 40 Code: Full   , MD  Internal Medicine Resident, PGY-1 06-04-2006 Internal Medicine Residency  Pager: [phone]

## 2021-08-10 NOTE — Progress Notes (Signed)
Physical Therapy Treatment Patient Details Name: Jason Figueroa MRN: [id number] DOB: 1966-04-07 Today's Date: 08/10/2021   History of Present Illness 56 y.o. male presents to [facility] hospital on 07/25/2021 with numbness and tingling on left side of face along with L sided weakness. 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 PMH inlcudes HTN, previous stroke,  and recent TIA.    PT Comments    The pt was eager to participate in session with focus on progressing OOB mobility and LE strength. He continues to demo deficits in LE strength, and was unable to initiate movements against gravity at hip or toes, or complete knee extension in gravity-minimized position. The pt was able to complete increased reps of sit-stand transfers and pivot to recliner with use of stedy, but was unable to progress gait at this time due to inability to clear feet for steps with wt shift. Will continue to benefit from skilled PT acutely as well as with max therapies after d/c to maximize functional recovery and decrease dependence for transfers.     Recommendations for follow up therapy are one component of a multi-disciplinary discharge planning process, led by the attending physician.  Recommendations may be updated based on patient status, additional functional criteria and insurance authorization.  Follow Up Recommendations  Skilled nursing-short term rehab (<3 hours/day) (denied AIR)     Assistance Recommended at Discharge Frequent or constant Supervision/Assistance  Patient can return home with the following A lot of help with walking and/or transfers;A lot of help with bathing/dressing/bathroom;Assistance with cooking/housework;Assistance with feeding;Direct supervision/assist for medications management;Direct supervision/assist for financial management;Assist for  transportation;Help with stairs or ramp for entrance   Equipment Recommendations  Wheelchair (measurements PT);Wheelchair cushion (measurements PT);Hospital bed    Recommendations for Other Services       Precautions / Restrictions Precautions Precautions: Fall Precaution Comments: L hemiparesis Restrictions Weight Bearing Restrictions: No     Mobility  Bed Mobility Overal bed mobility: Needs Assistance Bed Mobility: Supine to Sit     Supine to sit: Mod assist     General bed mobility comments: cues for using RLE to assist LLE, pt unable to complete movement and needing maxA to complete movement, use of bed rails and minA to scoot to be sitting EOB    Transfers Overall transfer level: Needs assistance Equipment used: Ambulation equipment used Transfers: Sit to/from Stand, Bed to chair/wheelchair/BSC Sit to Stand: Min assist           General transfer comment: pt completed x10 sit-stand from EOB to standing in stedy. heavy reliance on RUE and RLE, blocking of LLE and assist to position LUE on rail. pt unable to extend LLE to neutral for stance, stands with LLE in external rotation. x5 sit-stand from stedy flaps Transfer via Lift Equipment: Stedy  Ambulation/Gait             Pre-gait activities: wt shift in stedy, pt unable to extend at L knee or maintain without buckling with shift to L General Gait Details: pt unable   Modified Rankin (Stroke Patients Only) Modified Rankin (Stroke Patients Only) Pre-Morbid Rankin Score: No symptoms Modified Rankin: Severe disability     Balance Overall balance assessment: Needs assistance Sitting-balance support: Single extremity supported, Feet supported Sitting balance-[name] Scale: Poor Sitting balance - Comments: L lateral and posterior LOB without UE support, pt did let go once and had LOB needing  miNA to correct Postural control: Left lateral lean Standing balance support: Reliant on assistive device for balance,  Single extremity supported Standing balance-[name] Scale: Poor Standing balance comment: able to stand with B UE support at [facility] without additional assist for up to 2 min                            Cognition Arousal/Alertness: Awake/alert Behavior During Therapy: Impulsive Overall Cognitive Status: Impaired/Different from baseline Area of Impairment: Safety/judgement, Following commands, Problem solving                       Following Commands: Follows one step commands consistently Safety/Judgement: Decreased awareness of deficits, Decreased awareness of safety Awareness: Intellectual Problem Solving: Slow processing, Requires verbal cues, Requires tactile cues General Comments: pt needing intermittent cues for posture and positioning. follows simple cues for exercises well.        Exercises General Exercises - Lower Extremity Ankle Circles/Pumps: PROM, Left, 5 reps Quad Sets: AAROM, Left, 5 reps, Standing Heel Slides: AAROM, Left, 5 reps, Supine Other Exercises Other Exercises: sit-stand from EOB to stedy x 10, + x5 from flaps, + x2 from recliner to stedy Other Exercises: wt shift from L to R with attempt to step or reposition foot with each wt shift. pt only able to achieve partial heel lift of RLE    General Comments General comments (skin integrity, edema, etc.): VSS on RA      Pertinent Vitals/Pain Pain Assessment Pain Assessment: No/denies pain Faces Pain Scale: No hurt Pain Intervention(s): Monitored during session     PT Goals (current goals can now be found in the care plan section) Acute Rehab PT Goals Patient Stated Goal: to regain strength and independence PT Goal Formulation: With patient Time For Goal Achievement: 08/24/21 Potential to Achieve Goals: Good Progress towards PT goals: Progressing toward goals    Frequency    Min 3X/week      PT Plan Current plan remains appropriate       AM-PAC PT "6 Clicks" Mobility    Outcome Measure  Help needed turning from your back to your side while in a flat bed without using bedrails?: A Lot Help needed moving from lying on your back to sitting on the side of a flat bed without using bedrails?: A Lot Help needed moving to and from a bed to a chair (including a wheelchair)?: A Lot Help needed standing up from a chair using your arms (e.g., wheelchair or bedside chair)?: A Lot Help needed to walk in hospital room?: Total Help needed climbing 3-5 steps with a railing? : Total 6 Click Score: 10    End of Session Equipment Utilized During Treatment: Gait belt Activity Tolerance: Patient tolerated treatment well;Patient limited by fatigue Patient left: with call bell/phone within reach;in chair;with chair alarm set Nurse Communication: Mobility status PT Visit Diagnosis: Other abnormalities of gait and mobility (R26.89);Muscle weakness (generalized) (M62.81);Other symptoms and signs involving the nervous system (R29.898)     Time: 5053-9767 PT Time Calculation (min) (ACUTE ONLY): 38 min  Charges:  $Therapeutic Exercise: 23-37 mins $Neuromuscular Re-education: 8-22 mins                     Vickki Muff, PT, DPT   Acute Rehabilitation Department Pager #: [phone]   Ronnie Derby 08/10/2021, 4:33 PM

## 2021-08-10 NOTE — Progress Notes (Signed)
Paged by RN for patient being an outside nasal spray that is not on his home med list.  RN took away the spray and explained to patient that we can give the equivalent nasal spray that we have here, however we cannot give him the sprays that he brought in. Nasal spray bottle from [facility] says "do not give to patients with high BP."  Evaluated the patient at bedside.  I reemphasized that we can give him other options for his nasal congestion.  Patient states that he does not want anything else and that he "will leave if he has to."  However, was stating that he would prefer to sleep right now and did not want to take anything for his nasal congestion right now.

## 2021-08-11 DIAGNOSIS — J309 Allergic rhinitis, unspecified: Secondary | ICD-10-CM

## 2021-08-11 NOTE — Progress Notes (Signed)
Occupational Therapy Treatment Patient Details Name: Jason Figueroa MRN: [id number] DOB: 19-Jul-1965 Today's Date: 08/11/2021   History of present illness 56 y.o. male presents to [facility] hospital on 07/25/2021 with numbness and tingling on left side of face along with L sided weakness. 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 PMH inlcudes HTN, previous stroke,  and recent TIA.   OT comments  [name] continues to make limited progress. Session focused on PROM, weight bearing and sitting balance. Noted trace activation of distal LUE this session, and pt tolerated all PROM exercises well including scapular movement in sidelying. He continue to require mod A for bed mobility with cues for compensatory techniques and min-mod for sitting balance. Increased assist needed for LUE weightbearing with lateral lean and pushing to midline. Pt continues to benefit from OT acutely, d/c remains appropriate.    Recommendations for follow up therapy are one component of a multi-disciplinary discharge planning process, led by the attending physician.  Recommendations may be updated based on patient status, additional functional criteria and insurance authorization.    Follow Up Recommendations  Skilled nursing-short term rehab (<3 hours/day)    Assistance Recommended at Discharge Frequent or constant Supervision/Assistance  Patient can return home with the following  A lot of help with walking and/or transfers;A lot of help with bathing/dressing/bathroom;Assistance with cooking/housework;Direct supervision/assist for medications management;Assist for transportation;Help with stairs or ramp for entrance   Equipment Recommendations  BSC/3in1;Wheelchair cushion (measurements OT);Wheelchair (measurements OT);Tub/shower bench       Precautions / Restrictions  Precautions Precautions: Fall Precaution Comments: L hemiparesis Restrictions Weight Bearing Restrictions: No       Mobility Bed Mobility Overal bed mobility: Needs Assistance Bed Mobility: Supine to Sit Rolling: Min assist Sidelying to sit: Mod assist   Sit to supine: Mod assist        Transfers                   General transfer comment: pt requested to stay bed level this session     Balance Overall balance assessment: Needs assistance Sitting-balance support: Feet supported, Single extremity supported Sitting balance-[name] Scale: Poor           ADL either performed or assessed with clinical judgement   ADL Overall ADL's : Needs assistance/impaired Eating/Feeding: Set up Eating/Feeding Details (indicate cue type and reason): assist for packaging Grooming: Wash/dry face;Sitting;Minimal assistance Grooming Details (indicate cue type and reason): for sitting balance from EOB                             Functional mobility during ADLs: Moderate assistance General ADL Comments: pt continues to present with impaired sitting/standing balanace, L hemiparesis, and impaired proprioception    Extremity/Trunk Assessment Upper Extremity Assessment LUE Deficits / Details: PROM WFL, flaccid. small trace movement noted at hand. not using as functional assist. max A to promote weightbearing LUE Sensation: decreased proprioception LUE Coordination: decreased fine motor;decreased gross motor   Lower Extremity Assessment Lower Extremity Assessment: Defer to PT evaluation        Vision   Vision Assessment?: No apparent visual deficits   Perception Perception Perception: Impaired   Praxis Praxis Praxis: Impaired Praxis Impairment Details: Motor planning    Cognition Arousal/Alertness: Awake/alert Behavior During Therapy: Impulsive Overall Cognitive Status: Impaired/Different from baseline Area of Impairment: Safety/judgement, Following commands,  Problem solving                       Following Commands: Follows one step commands consistently Safety/Judgement: Decreased awareness of deficits, Decreased awareness of safety Awareness: Intellectual Problem Solving: Slow processing, Requires verbal cues, Requires tactile cues General Comments: needs cues for all tasks with balance and L hemibody and to promote carryover        Exercises Other Exercises Other Exercises: PROM of LUE: shoulder, elbow, wrist hand Other Exercises: L scapula manipulation in sidelying Other Exercises: weight bearing throughout LUE in sitting with lateral leaning       General Comments VSS on RA    Pertinent Vitals/ Pain       Pain Assessment Pain Assessment: No/denies pain   Frequency  Min 2X/week        Progress Toward Goals  OT Goals(current goals can now be found in the care plan section)  Progress towards OT goals: Progressing toward goals  Acute Rehab OT Goals Patient Stated Goal: get stronger OT Goal Formulation: With patient Time For Goal Achievement: 08/10/21 Potential to Achieve Goals: Good ADL Goals Pt Will Perform Grooming: with set-up;sitting Pt Will Perform Upper Body Dressing: with set-up;sitting Pt Will Perform Lower Body Dressing: with min guard assist;sit to/from stand Pt Will Transfer to Toilet: with min guard assist;stand pivot transfer;bedside commode Additional ADL Goal #1: pt will independently complete bed mobility as a precursor to ADLs  Plan Discharge plan remains appropriate;Frequency remains appropriate    Co-evaluation                 AM-PAC OT "6 Clicks" Daily Activity     Outcome Measure   Help from another person eating meals?: A Little Help from another person taking care of personal grooming?: A Little Help from another person toileting, which includes using toliet, bedpan, or urinal?: A Lot Help from another person bathing (including washing, rinsing, drying)?: A Lot Help from  another person to put on and taking off regular upper body clothing?: A Lot Help from another person to put on and taking off regular lower body clothing?: A Lot 6 Click Score: 14    End of Session    OT Visit Diagnosis: Unsteadiness on feet (R26.81);Other abnormalities of gait and mobility (R26.89);Muscle weakness (generalized) (M62.81);Pain;Hemiplegia and hemiparesis;Feeding difficulties (R63.3) Hemiplegia - Right/Left: Left Hemiplegia - dominant/non-dominant: Non-Dominant Hemiplegia - caused by: Cerebral infarction   Activity Tolerance Patient tolerated treatment well   Patient Left in bed;with call bell/phone within reach;with bed alarm set   Nurse Communication Mobility status        Time: 0350-0938 OT Time Calculation (min): 17 min  Charges: OT General Charges $OT Visit: 1 Visit OT Treatments $Therapeutic Activity: 8-22 mins   Mahalie Kanner A Lundynn Cohoon 08/11/2021, 4:08 PM

## 2021-08-11 NOTE — Plan of Care (Signed)
  Problem: Education: Goal: Knowledge of disease or condition will improve Outcome: Progressing Goal: Knowledge of secondary prevention will improve (SELECT ALL) Outcome: Progressing   

## 2021-08-11 NOTE — Progress Notes (Signed)
° °  Subjective Overnight events: complained of left leg muscle spasm that resolved ~20 mins later not requiring intervention.    Pt is seen at bedside during rounds today. He complains of leg spasm. He reports feeling well otherwise. No other questions or complaints.   Objective:  Vital signs in last 24 hours: Vitals:   08/10/21 2321 08/11/21 0338 08/11/21 0803 08/11/21 1100  BP: 112/80 (!) 142/76 135/77 128/73  Pulse: (!) 56 (!) 55 62 64  Resp: 16 16 20 20   Temp: 98 F (36.7 C) 97.8 F (36.6 C) 98.2 F (36.8 C) 99.1 F (37.3 C)  TempSrc: Oral Oral Oral Oral  SpO2: 97% 97% 96% 95%  Weight:      Height:       Constitutional: alert, well-appearing, in NAD  Cardiovascular: RRR, no m/r/g, no edema of bilateral LE Pulmonary/Chest: normal work of breathing on room air  Abdominal: soft, non-tender to palpation, non-distended Neurological: A&O x 3, follows commands. Persistent, improved left facial droop and dysarthria.   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 No further encephalopathy with reduced dose of baclofen, but now complains of increased spasms. Working with PT; stable left sided deficits persist. Remains stable for DC at this time, pending placement. - Continue Baclofen 10 mg TID dose with increase to 20 mg total at bedtime. 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 Improved alertness and bradycardia resolved.  - Continue PO synthroid Pearlean Brownie   - Recheck TSH and free T4 in 4-6 weeks (3/9 - 3/23)   Dry eyes - Artificial tears   Best Practice: Diet: Regular diet  IVF: None,None VTE: [place] 40 Code: Full   , MD  Internal Medicine Resident, PGY-1 06-04-2006 Internal Medicine Residency  Pager: [phone]

## 2021-08-12 MED ORDER — BACLOFEN 10 MG PO TABS
10.0000 mg | ORAL_TABLET | Freq: Once | ORAL | Status: AC
  Administered 2021-08-12: 10 mg via ORAL
  Filled 2021-08-12: qty 1

## 2021-08-12 MED ORDER — BACLOFEN 10 MG PO TABS
10.0000 mg | ORAL_TABLET | Freq: Two times a day (BID) | ORAL | Status: DC
  Administered 2021-08-12 – 2021-08-16 (×9): 10 mg via ORAL
  Filled 2021-08-12 (×9): qty 1

## 2021-08-12 MED ORDER — IBUPROFEN 200 MG PO TABS
400.0000 mg | ORAL_TABLET | Freq: Once | ORAL | Status: DC
  Filled 2021-08-12: qty 2

## 2021-08-12 MED ORDER — BACLOFEN 10 MG PO TABS: 10.0000 mg | ORAL_TABLET | Freq: Every day | ORAL | Status: DC

## 2021-08-12 MED ORDER — LIDOCAINE 5 % EX PTCH
1.0000 | MEDICATED_PATCH | CUTANEOUS | Status: DC
  Administered 2021-08-12 – 2021-08-17 (×5): 1 via TRANSDERMAL
  Filled 2021-08-12 (×7): qty 1

## 2021-08-12 MED ORDER — BACLOFEN 10 MG PO TABS
20.0000 mg | ORAL_TABLET | Freq: Every day | ORAL | Status: DC
  Administered 2021-08-12 – 2021-08-15 (×4): 20 mg via ORAL
  Filled 2021-08-12 (×4): qty 2

## 2021-08-12 NOTE — Progress Notes (Signed)
Physical Therapy Treatment Patient Details Name: Jason Figueroa MRN: [id number] DOB: May 02, 1966 Today's Date: 08/12/2021   History of Present Illness 56 y.o. male presents to [facility] hospital on 07/25/2021 with numbness and tingling on left side of face along with L sided weakness. 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 PMH inlcudes HTN, previous stroke,  and recent TIA.    PT Comments    Improving toward goals, but over extended time.  Emphasis today on transitions, sit to stand technique and safety over 10 reps, standing balance, knee control and pre-gait standing activity.   Recommendations for follow up therapy are one component of a multi-disciplinary discharge planning process, led by the attending physician.  Recommendations may be updated based on patient status, additional functional criteria and insurance authorization.  Follow Up Recommendations  Skilled nursing-short term rehab (<3 hours/day)     Assistance Recommended at Discharge Frequent or constant Supervision/Assistance  Patient can return home with the following A lot of help with walking and/or transfers;A little help with bathing/dressing/bathroom;Assistance with cooking/housework;Direct supervision/assist for medications management;Direct supervision/assist for financial management;Assist for transportation;Help with stairs or ramp for entrance   Equipment Recommendations  Wheelchair (measurements PT);Wheelchair cushion (measurements PT);Hospital bed    Recommendations for Other Services       Precautions / Restrictions Precautions Precautions: Fall Precaution Comments: L hemiparesis L plegic arm     Mobility  Bed Mobility Overal bed mobility: Needs Assistance Bed Mobility: Rolling, Sidelying to Sit Rolling: Min assist, Mod assist Sidelying to sit: Mod assist Supine  to sit: Mod assist          Transfers Overall transfer level: Needs assistance Equipment used:  (chair back) Transfers: Sit to/from Stand Sit to Stand: Min assist (x10)           General transfer comment: worked on Manufacturing engineer, safety and balance once up with cues for sequencing and hand placement, assist mostly for coming forward and stability/ control at L knee    Ambulation/Gait               General Gait Details: not able   Stairs             Wheelchair Mobility    Modified Rankin (Stroke Patients Only) Modified Rankin (Stroke Patients Only) Modified Rankin: Severe disability     Balance Overall balance assessment: Needs assistance Sitting-balance support: Feet supported, Single extremity supported, No upper extremity supported   Sitting balance - Comments: still lists laterally L when loses focus, but cued and focused, pt able to maintain balance for 15-20 secs without UE or external assist   Standing balance support: Single extremity supported, During functional activity, No upper extremity supported Standing balance-[name] Scale: Poor Standing balance comment: worked in stance over 10 trials of standing on w/shifting, balance, R knee control with/without R UE assist on chair back.                            Cognition Arousal/Alertness: Awake/alert Behavior During Therapy: Impulsive Overall Cognitive Status: Impaired/Different from baseline Area of Impairment: Safety/judgement, Following commands, Problem solving                       Following Commands: Follows one step commands consistently Safety/Judgement: Decreased awareness of deficits, Decreased awareness of safety Awareness: Emergent Problem Solving: Slow  processing, Difficulty sequencing          Exercises Other Exercises Other Exercises: PROM of LUE: shoulder, elbow, wrist hand    General Comments General comments (skin integrity, edema, etc.):  vss      Pertinent Vitals/Pain Pain Assessment Faces Pain Scale: No hurt Pain Intervention(s): Monitored during session    Home Living                          Prior Function            PT Goals (current goals can now be found in the care plan section) Acute Rehab PT Goals PT Goal Formulation: With patient Time For Goal Achievement: 08/24/21 Potential to Achieve Goals: Good Progress towards PT goals: Progressing toward goals    Frequency    Min 3X/week      PT Plan Current plan remains appropriate    Co-evaluation              AM-PAC PT "6 Clicks" Mobility   Outcome Measure  Help needed turning from your back to your side while in a flat bed without using bedrails?: A Lot Help needed moving from lying on your back to sitting on the side of a flat bed without using bedrails?: A Lot Help needed moving to and from a bed to a chair (including a wheelchair)?: A Lot Help needed standing up from a chair using your arms (e.g., wheelchair or bedside chair)?: A Little Help needed to walk in hospital room?: Total Help needed climbing 3-5 steps with a railing? : Total 6 Click Score: 11    End of Session   Activity Tolerance: Patient tolerated treatment well;Patient limited by fatigue Patient left: in bed;with call bell/phone within reach;with bed alarm set Nurse Communication: Mobility status PT Visit Diagnosis: Other abnormalities of gait and mobility (R26.89);Muscle weakness (generalized) (M62.81);Other symptoms and signs involving the nervous system (R29.898)     Time: 0938-1829 PT Time Calculation (min) (ACUTE ONLY): 34 min  Charges:  $Therapeutic Activity: 8-22 mins $Neuromuscular Re-education: 8-22 mins                     08/12/2021  Jacinto Halim., PT Acute Rehabilitation Services [phone]  (pager) [phone]  (office)   Jason Figueroa 08/12/2021, 7:05 PM

## 2021-08-12 NOTE — Plan of Care (Signed)
?  Problem: Education: ?Goal: Knowledge of disease or condition will improve ?Outcome: Progressing ?Goal: Knowledge of secondary prevention will improve (SELECT ALL) ?Outcome: Progressing ?Goal: Knowledge of patient specific risk factors will improve (INDIVIDUALIZE FOR PATIENT) ?Outcome: Progressing ?  ?

## 2021-08-12 NOTE — Progress Notes (Signed)
@  approx. 77 Dr. Alroy Bailiff, on-call for attending, paged regarding pt's endorsement of painful muscle spasms in his stroke-affected left side unrelieved by scheduled Baclofen and PRN APAP. Order received for 1 time dose of Ibuprofen.   @approx . 0200, this RN went to give pt medication and pt found to be asleep. Will hold medication and administer if pt awakes with additional spasms.

## 2021-08-12 NOTE — Progress Notes (Signed)
° °  Subjective Overnight events: none  Pt is seen at bedside during rounds today. He reports some improvement in leg spasm and reports feeling well overall. Continues to work with PT. No other questions or complaints.   Objective:  Vital signs in last 24 hours: Vitals:   08/12/21 0000 08/12/21 0330 08/12/21 0720 08/12/21 1058  BP: 131/76 127/84 128/80 121/83  Pulse: 73 60 (!) 55 65  Resp: 17 17 20 20   Temp: 98.2 F (36.8 C) 98.3 F (36.8 C) 98.2 F (36.8 C) 98.6 F (37 C)  TempSrc: Oral Oral Oral Oral  SpO2: 97% 96% 97% 98%  Weight:      Height:       Constitutional: alert, well-appearing, in NAD  Cardiovascular: RRR, no m/r/g, no edema of bilateral LE Pulmonary/Chest: normal work of breathing on room air  Abdominal: soft, non-tender to palpation, non-distended Neurological: A&O x 3, follows commands. Persistent, improved left facial droop and dysarthria.   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 No further encephalopathy after baclofen dose reduction. Does complain of some ongoing spasm today. Working with PT; stable left sided deficits persist. Remains stable for DC at this time, pending placement. - One time baclofen 5 mg added for now  - Continue Baclofen 10 mg BID and 20 mg at bedtime - ASA 81mg  daily - Clopidogrel 75mg  daily for 3 weeks - Atorvastatin 80 mg  - PT/OT following  - Denied CIR; SW assisting with SNF placement - Outpatient sleep study recommended   Allergic rhinitis  - Flonase nasal spray  - Claritin added as a conjunct given  delayed affect of Flonase  - Ocean nasal spray   AKI, resolved  - HCTZ discontinued  - Avoid nephrotoxins   HTN Normotensive - Continue Lisinopril 40 mg and amlodipine 10 mg - Continue Hydralazine 10 mg TID; HR does not allow for BB - HCTZ discontinued given AKI after starting    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 Improved alertness and bradycardia resolved.  - Continue PO synthroid Pearlean Brownie   - Recheck TSH and free T4 in 4-6 weeks (3/9 - 3/23)   Dry eyes - Artificial tears   Best Practice: Diet: Regular diet  IVF: None,None VTE: Levenox 40 Code: Full   , MD  Internal Medicine Resident, PGY-1 06-04-2006 Internal Medicine Residency  Pager: [phone]

## 2021-08-13 MED ORDER — SENNOSIDES-DOCUSATE SODIUM 8.6-50 MG PO TABS
2.0000 | ORAL_TABLET | Freq: Every evening | ORAL | Status: DC | PRN
  Administered 2021-08-13 – 2021-08-21 (×4): 2 via ORAL
  Filled 2021-08-13 (×4): qty 2

## 2021-08-13 MED ORDER — BACLOFEN 10 MG PO TABS
5.0000 mg | ORAL_TABLET | Freq: Once | ORAL | Status: AC
  Administered 2021-08-13: 5 mg via ORAL
  Filled 2021-08-13: qty 1

## 2021-08-13 NOTE — Progress Notes (Signed)
° °  Subjective Overnight events: none  Pt is seen at bedside during rounds today. He reports leg spasms but reports feeling well otherwise. Continues to work with PT. No other questions or complaints.   Objective:  Vital signs in last 24 hours: Vitals:   08/13/21 0040 08/13/21 0359 08/13/21 0720 08/13/21 1109  BP: 130/89 (!) 107/55  138/84  Pulse: (!) 59 (!) 56  (!) 59  Resp: 18 18  12   Temp: 97.6 F (36.4 C) 98.4 F (36.9 C) 98.5 F (36.9 C) 98.3 F (36.8 C)  TempSrc: Oral Oral Oral Oral  SpO2: 98% 95%  98%  Weight:      Height:       Constitutional: alert, well-appearing, in NAD  Cardiovascular: RRR, no m/r/g, no edema of bilateral LE Pulmonary/Chest: normal work of breathing on room air  Abdominal: soft, non-tender to palpation, non-distended Neurological: A&O x 3, follows commands. Persistent, improved left facial droop and dysarthria.   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 No further encephalopathy after baclofen dose reduction. Does complain of some ongoing spasm today. Working with PT; stable left sided deficits persist. Remains stable for DC at this time, pending placement. - Continue Baclofen 10 mg BID and 20 mg at bedtime - Started his gabapentin 100 TID  - ASA 81mg  daily - Clopidogrel 75mg  daily for 3 weeks - Atorvastatin 80 mg  - PT/OT following  - Denied CIR; SW assisting with SNF placement - Outpatient sleep study recommended   Allergic rhinitis  - Flonase nasal spray  - Claritin added as a conjunct given delayed affect of Flonase   - Ocean nasal spray   AKI, resolved  - HCTZ discontinued  - Avoid nephrotoxins   HTN Slightly hypertensive this morning.  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 Improved alertness and bradycardia resolved.  - Continue PO synthroid Pearlean Brownie   - Recheck TSH and free T4 in 4-6 weeks (3/9 - 3/23)   Dry eyes - Artificial tears   Best Practice: Diet: Regular diet  IVF: None,None VTE: Levenox 40 Code: Full   , MD  Internal Medicine Resident, PGY-1 06-04-2006 Internal Medicine Residency  Pager: [phone]

## 2021-08-13 NOTE — Progress Notes (Signed)
Occupational Therapy Treatment Patient Details Name: Jason Figueroa MRN: [id number] DOB: 03-Feb-1966 Today's Date: 08/13/2021   History of present illness 56 y.o. male presents to [facility] hospital on 07/25/2021 with numbness and tingling on left side of face along with L sided weakness. 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 PMH inlcudes HTN, previous stroke,  and recent TIA.   OT comments  Excellent participation during session. Focus of session on facilitation of movement using PNF and NDT techniques. Pt with improved movement patterns at end of session. Pt reports getting  L hand/thumb trapped under him. Will order a L resting hand splint - goals will be for pt to tolerate during night time hours. Pt will benefit from intensive poast acute rehab. Pt very appreciative.    Recommendations for follow up therapy are one component of a multi-disciplinary discharge planning process, led by the attending physician.  Recommendations may be updated based on patient status, additional functional criteria and insurance authorization.    Follow Up Recommendations  Skilled nursing-short term rehab (<3 hours/day)    Assistance Recommended at Discharge Frequent or constant Supervision/Assistance  Patient can return home with the following  A lot of help with walking and/or transfers;A lot of help with bathing/dressing/bathroom;Assist for transportation;Direct supervision/assist for financial management;Direct supervision/assist for medications management;Assistance with cooking/housework;Help with stairs or ramp for entrance   Equipment Recommendations  BSC/3in1;Wheelchair cushion (measurements OT);Wheelchair (measurements OT);Tub/shower bench    Recommendations for Other Services      Precautions / Restrictions Precautions Precautions: Fall Precaution Comments:  L hemiparesis L plegic arm       Mobility Bed Mobility     Rolling: Min assist (rolling L with S; Rolling R with min A)              Transfers                         Balance                                           ADL either performed or assessed with clinical judgement   ADL Overall ADL's : Needs assistance/impaired                                       General ADL Comments: focus of session on LUE facilitation    Extremity/Trunk Assessment Upper Extremity Assessment LUE Deficits / Details: PROM WFL; with facilitation using PNF and quick stretch, able to recruit scapular elevation adn pec/biceps LUE Sensation: decreased light touch;decreased proprioception LUE Coordination: decreased fine motor;decreased gross motor (non functional at this time; reports getting arm trapped under him at night)   Lower Extremity Assessment LLE Deficits / Details: once placed in flexion. pt able to ab/adduct leg during "windshield wiper"; increased movemetn compared to baseline;        Vision   Additional Comments: L inattention   Perception Perception Perception: Impaired   Praxis Praxis Praxis: Impaired Praxis Impairment Details: Motor planning    Cognition Arousal/Alertness: Awake/alert Behavior During Therapy: Flat affect Overall Cognitive Status: Impaired/Different from baseline Area of Impairment: Attention, Awareness, Problem solving  Current Attention Level: Selective   Following Commands: Follows one step commands consistently Safety/Judgement: Decreased awareness of safety, Decreased awareness of deficits Awareness: Emergent Problem Solving: Slow processing, Decreased initiation (however improved) General Comments: pt talking about the hole in his heart causing his stroke        Exercises Other Exercises Other Exercises: facilitation L scap elevation/depression in R sidelying Other  Exercises: PNF patterns used in sidelying and supine; feel pt was beginning to recruit pecs adn flexors after facilitation Other Exercises: repetitiva mielin crossing to facilitate core strength Other Exercises: place and hold with BLUE, moving in "windshield wiper" motion to increase strength @ core and pelvic area Other Exercises: In R sidelying, depressing scapula and elevaitn ghip through use of PNF techniques with good carry over    Shoulder Instructions       General Comments      Pertinent Vitals/ Pain       Pain Assessment Pain Assessment: Faces Faces Pain Scale: Hurts a little bit Pain Intervention(s): Limited activity within patient's tolerance  Home Living                                          Prior Functioning/Environment              Frequency  Min 2X/week        Progress Toward Goals  OT Goals(current goals can now be found in the care plan section)  Progress towards OT goals: Progressing toward goals (goals updated)  Acute Rehab OT Goals Patient Stated Goal: get stronger OT Goal Formulation: With patient Time For Goal Achievement: 08/27/21 Potential to Achieve Goals: Good ADL Goals Pt Will Perform Grooming: with supervision;sitting Pt Will Perform Upper Body Dressing: with set-up;sitting Pt Will Perform Lower Body Dressing: with min guard assist;sit to/from stand;sitting/lateral leans Pt Will Transfer to Toilet: with min guard assist;bedside commode;squat pivot transfer Additional ADL Goal #1: PT will complete bed mobility with S in preparation for ADL tasks Additional ADL Goal #2: Pt will independently complete self ROM  to maintain LUE [facility] for ADL tasks Additional ADL Goal #3: Pt will tolerate L resting hadn splint during night time hours without complications to protect L hand from injust and reduce risk of contracture development  Plan Discharge plan remains appropriate;Frequency remains appropriate    Co-evaluation                  AM-PAC OT "6 Clicks" Daily Activity     Outcome Measure   Help from another person eating meals?: A Little Help from another person taking care of personal grooming?: A Little Help from another person toileting, which includes using toliet, bedpan, or urinal?: A Lot Help from another person bathing (including washing, rinsing, drying)?: A Lot Help from another person to put on and taking off regular upper body clothing?: A Lot Help from another person to put on and taking off regular lower body clothing?: A Lot 6 Click Score: 14    End of Session    OT Visit Diagnosis: Unsteadiness on feet (R26.81);Other abnormalities of gait and mobility (R26.89);Muscle weakness (generalized) (M62.81);Pain;Hemiplegia and hemiparesis;Feeding difficulties (R63.3) Hemiplegia - Right/Left: Left Hemiplegia - dominant/non-dominant: Non-Dominant Hemiplegia - caused by: Cerebral infarction Pain - Right/Left: Left Pain - part of body: Hand (thumb)   Activity Tolerance Patient tolerated treatment well   Patient Left in bed;with call bell/phone within reach;with bed  alarm set;Other (comment) (chair position)   Nurse Communication Mobility status;Other (comment) (L resting hand splint ordered)        Time: [id number] OT Time Calculation (min): 34 min  Charges: OT Treatments $Neuromuscular Re-education: 23-37 mins  Maurie Boettcher, OT/L   Acute OT Clinical Specialist Acute Rehabilitation Services Pager [phone] Office [phone]   [facility] 08/13/2021, 3:38 PM

## 2021-08-13 NOTE — TOC Progression Note (Addendum)
Transition of Care [facility]) - Progression Note    Patient Details  Name: Jason Figueroa MRN: [id number] Date of Birth: 1966/01/08  Transition of Care [facility]) CM/SW Contact  Erin Sons, [place] Phone Number: 08/13/2021, 11:27 AM  Clinical Narrative:     Per [facility] notation, they screened pt for medicaid/disability on 08/04/21. Their next note indicates that they collected medical bills documents to be submitted with medicaid application on 08/07/21.  CSW spoke with [facility]; they would consider LOG  and will review pt. They would require a medicaid pending number if they were to accept him.   CSW resent referral in Hub.   CSW emailed [facility] to inquire about medicaid #.   1215: Received call from Colt at [facility]. She explained that pt does not have any income and before they could accept pt, they would need to have a disability application started and would require a "Rolling" LOG for payment to facility until pt's medicaid/disability kicks in. She stated that if Rolling LOG is approved and pt's disability application is started, they can accept pt.    Expected Discharge Plan: Skilled Nursing Facility Barriers to Discharge: Inadequate or no insurance, SNF Pending bed offer, SNF Pending payor source - LOG, SNF Pending Medicaid  Expected Discharge Plan and Services Expected Discharge Plan: Skilled Nursing Facility     Post Acute Care Choice: Skilled Nursing Facility Living arrangements for the past 2 months: Single Family Home                                       Social Determinants of Health (SDOH) Interventions    Readmission Risk Interventions No flowsheet data found.

## 2021-08-14 LAB — BASIC METABOLIC PANEL
Anion gap: 10 (ref 5–15)
BUN: 30 mg/dL — ABNORMAL HIGH (ref 6–20)
CO2: 25 mmol/L (ref 22–32)
Calcium: 9.5 mg/dL (ref 8.9–10.3)
Chloride: 109 mmol/L (ref 98–111)
Creatinine, Ser: 1.08 mg/dL (ref 0.61–1.24)
GFR, Estimated: >60 mL/min (ref >60)
Glucose, Bld: 114 mg/dL — ABNORMAL HIGH (ref 70–99)
Potassium: 4.3 mmol/L (ref 3.5–5.1)
Sodium: 144 mmol/L (ref 135–145)

## 2021-08-14 MED ORDER — GABAPENTIN 100 MG PO CAPS
100.0000 mg | ORAL_CAPSULE | Freq: Three times a day (TID) | ORAL | Status: DC
  Administered 2021-08-15 – 2021-08-16 (×4): 100 mg via ORAL
  Filled 2021-08-14 (×5): qty 1

## 2021-08-14 NOTE — Progress Notes (Addendum)
° °  Subjective Overnight events: none  Pt is seen at bedside during rounds today.  We had awakened him so the visit was brief.  He reports feeling okay with no new problems.  He is aware of the plans of heart failure team and ID team.   No other questions or complaints.   Objective:  Vital signs in last 24 hours: Vitals:   08/13/21 2028 08/13/21 2313 08/14/21 0422 08/14/21 0809  BP: 138/61 126/69 124/62 (!) 153/79  Pulse: 73 68 (!) 56 61  Resp: 14 14 14 16   Temp: 99.3 F (37.4 C) 97.9 F (36.6 C) 97.8 F (36.6 C)   TempSrc: Oral Oral    SpO2: 98% 98% 97% 99%  Weight:      Height:       Constitutional: alert, well-appearing, in NAD  Cardiovascular: RRR, no m/r/g, no edema of bilateral LE Pulmonary/Chest: normal work of breathing on room air  Abdominal: soft, non-tender to palpation, non-distended Neurological: A&O x 3, follows commands. Persistent, improved left facial droop and dysarthria.   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 No further encephalopathy after baclofen dose reduction. Does complain of some ongoing spasm today. Working with PT; stable left sided deficits persist. Remains stable for DC at this time, pending placement. - Continue Baclofen 10 mg BID and 20 mg at bedtime - Continue gabapentin 100 TID  - ASA 81mg  daily - Clopidogrel 75mg  daily for 3 weeks - Atorvastatin 80 mg  - PT/OT following  - Denied CIR; SW assisting with SNF placement - Outpatient sleep study recommended   Allergic rhinitis  - Flonase nasal spray  - Claritin added  as a conjunct given delayed affect of Flonase  - Ocean nasal spray   AKI, resolved  - HCTZ discontinued  - Avoid nephrotoxins   HTN Slightly hypertensive this morning.  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 Improved alertness and bradycardia resolved.  - Continue PO synthroid Pearlean Brownie   - Recheck TSH and free T4 in 4-6 weeks (3/9 - 3/23)   Dry eyes - Artificial tears   Best Practice: Diet: Regular diet  IVF: None,None VTE: Levenox 40 Code: Full   , MD  Internal Medicine Resident, PGY-1 06-04-2006 Internal Medicine Residency  Pager: [phone]

## 2021-08-14 NOTE — Progress Notes (Signed)
PT Cancellation Note  Patient Details Name: Jason Figueroa MRN: [id number] DOB: 1966/01/21   Cancelled Treatment:    Reason Eval/Treat Not Completed: Patient declined, no reason specified.  Pt stated that he was too tired and fatigued with everyone wanting to see him, no sleep from night of spasms. 08/14/2021  Jacinto Halim., PT Acute Rehabilitation Services [phone]  (pager) [phone]  (office)   Eliseo Gum Kalep Full 08/14/2021, 4:01 PM

## 2021-08-14 NOTE — TOC Progression Note (Addendum)
Transition of Care [facility]) - Progression Note    Patient Details  Name: MERGIM ROTHWELL MRN: [id number] Date of Birth: [birth year]  Transition of Care [facility]) CM/SW [place], [place] Phone Number: 08/14/2021, 2:32 PM  Clinical Narrative:   CSW attempting to reach [place] at [facility] to discuss possible LOG Placement. Left a voicemail, awaiting call back.  ADDENDUM 2:38 PM: CSW spoke with Claiborne Billings at [facility], and she is requesting that an LOG contract specify that SNF will continue to get paid as long as disability remains pending. CSW reaching out to [facility] supervisor to ask if that's possible. CSW to follow.  ADDENDUM 3:00 PM: CSW spoke with [facility] and with Claiborne Billings at [facility] again. After further discussion and clarification, [place] is not offering bed for LOG placement at this time. CSW to follow.    Expected Discharge Plan: [place] Barriers to Discharge: Inadequate or no insurance, SNF Pending bed offer, SNF Pending payor source - LOG, SNF Pending Medicaid  Expected Discharge Plan and Services Expected Discharge Plan: [place] Choice: [place] arrangements for the past 2 months: Single Family Home                                       Social Determinants of Health (SDOH) Interventions    Readmission Risk Interventions No flowsheet data found.

## 2021-08-15 NOTE — Progress Notes (Signed)
Occupational Therapy Treatment Patient Details Name: Jason Figueroa MRN: [id number] DOB: [birth year] Today's Date: 08/15/2021   History of present illness 56 y.o. male presents to [facility] hospital on 07/25/2021 with numbness and tingling on left side of face along with L sided weakness. 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 PMH inlcudes HTN, previous stroke,  and recent TIA.   OT comments  Pt making progress with functional goals. Pt in bed and agreeable to work with OT. Pt sat EOB mod A with cues to reach across midline to use rail, required assist with L LE and to elevate trunk. Pt sat EOB x 22 minutes for grooming and UB ADL tasks and for self ROM as instructed by OT using R UE to assist L UE with elbow flexion/extension and FF of shoulders, L wrist and digit flexion/extension, reaching forward across midline and to forward with R UE/trunk. Trialed L resting hand splint ordered by previous OT and adjusted straps for appropriate fit (may need to adjust strapping and hardware for a more proper fit). OT will continue to follow acutely to maximize level of function and safety   Recommendations for follow up therapy are one component of a multi-disciplinary discharge planning process, led by the attending physician.  Recommendations may be updated based on patient status, additional functional criteria and insurance authorization.    Follow Up Recommendations  Skilled nursing-short term rehab (<3 hours/day)    Assistance Recommended at Discharge Frequent or constant Supervision/Assistance  Patient can return home with the following  A lot of help with walking and/or transfers;A lot of help with bathing/dressing/bathroom;Assist for transportation;Direct supervision/assist for financial management;Direct supervision/assist for medications management;Assistance  with cooking/housework;Help with stairs or ramp for entrance   Equipment Recommendations  BSC/3in1;Wheelchair cushion (measurements OT);Wheelchair (measurements OT);Tub/shower bench    Recommendations for Other Services      Precautions / Restrictions Precautions Precautions: Fall Precaution Comments: L hemiplegia Restrictions Weight Bearing Restrictions: No       Mobility Bed Mobility Overal bed mobility: Needs Assistance       Supine to sit: Mod assist Sit to supine: Mod assist   General bed mobility comments: mod A with L LE and to elevate trunk. pt able to use bed rails to pul slef to [facility] with R UE when returned to supine. Pt sat EOB x 22 minutes    Transfers                         Balance Overall balance assessment: Needs assistance Sitting-balance support: Feet supported, Single extremity supported, No upper extremity supported Sitting balance-[name] Scale: Poor Sitting balance - Comments: min verbal cues to maintain balance and to use bedrail on R side shift weight and to come to midline from L leaning Postural control: Left lateral lean                                 ADL either performed or assessed with clinical judgement   ADL Overall ADL's : Needs assistance/impaired     Grooming: Wash/dry hands;Wash/dry face;Brushing hair;Minimal assistance;Sitting Grooming Details (indicate cue type and reason): for sitting balance from EOB Upper Body Bathing: Moderate assistance;Sitting Upper Body Bathing Details (indicate cue type and reason): simulated seated EOB, Poor sitting balance     Upper Body Dressing :  Moderate assistance;Sitting Upper Body Dressing Details (indicate cue type and reason): Poor sitting balance at EOB                        Extremity/Trunk Assessment Upper Extremity Assessment Upper Extremity Assessment: LUE deficits/detail LUE Deficits / Details: hemiplegic, hemiparesis LUE Sensation: decreased light  touch;decreased proprioception LUE Coordination: decreased fine motor;decreased gross motor   Lower Extremity Assessment Lower Extremity Assessment: Defer to PT evaluation   Cervical / Trunk Assessment Cervical / Trunk Assessment: Normal    Vision Baseline Vision/History: 1 Wears glasses Patient Visual Report: No change from baseline     Perception     Praxis      Cognition Arousal/Alertness: Awake/alert Behavior During Therapy: Flat affect Overall Cognitive Status: Impaired/Different from baseline Area of Impairment: Attention, Awareness, Problem solving, Safety/judgement                       Following Commands: Follows one step commands consistently Safety/Judgement: Decreased awareness of safety, Decreased awareness of deficits   Problem Solving: Slow processing, Decreased initiation          Exercises      Shoulder Instructions       General Comments      Pertinent Vitals/ Pain       Pain Assessment Pain Assessment: Faces Faces Pain Scale: Hurts a little bit Pain Location: L LE Pain Descriptors / Indicators: Grimacing, Guarding, Spasm Pain Intervention(s): Monitored during session, Repositioned  Home Living                                          Prior Functioning/Environment              Frequency  Min 2X/week        Progress Toward Goals  OT Goals(current goals can now be found in the care plan section)  Progress towards OT goals: Progressing toward goals     Plan Discharge plan remains appropriate;Frequency remains appropriate    Co-evaluation                 AM-PAC OT "6 Clicks" Daily Activity     Outcome Measure   Help from another person eating meals?: A Little (set up) Help from another person taking care of personal grooming?: A Little Help from another person toileting, which includes using toliet, bedpan, or urinal?: A Lot Help from another person bathing (including washing, rinsing,  drying)?: A Lot Help from another person to put on and taking off regular upper body clothing?: A Lot Help from another person to put on and taking off regular lower body clothing?: A Lot 6 Click Score: 14    End of Session    OT Visit Diagnosis: Unsteadiness on feet (R26.81);Other abnormalities of gait and mobility (R26.89);Muscle weakness (generalized) (M62.81);Pain;Hemiplegia and hemiparesis;Feeding difficulties (R63.3) Hemiplegia - Right/Left: Left Hemiplegia - dominant/non-dominant: Non-Dominant Hemiplegia - caused by: Cerebral infarction Pain - Right/Left: Left Pain - part of body: Leg   Activity Tolerance Patient limited by fatigue   Patient Left in bed;with call bell/phone within reach;with bed alarm set   Nurse Communication          Time: [id number] OT Time Calculation (min): 38 min  Charges: OT General Charges $OT Visit: 1 Visit OT Treatments $Self Care/Home Management : 8-22 mins $Therapeutic Activity: 23-37 mins  Emmit Alexanders [facility] 08/15/2021, 1:38 PM

## 2021-08-15 NOTE — Progress Notes (Signed)
Physical Therapy Treatment Patient Details Name: Jason Figueroa MRN: [id number] DOB: 20-Feb-1966 Today's Date: 08/15/2021   History of Present Illness 56 y.o. male presents to [facility] hospital on 07/25/2021 with numbness and tingling on left side of face along with L sided weakness. 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 PMH inlcudes HTN, previous stroke,  and recent TIA.    PT Comments    Pt crossed some major hurdles that have been eluding him.  Emphasis on transitions, sitting and standing balance.  Significant work and progress on finding the balance point on coming forward to either pivot to the chair or stand.  Noted weak activation of L LE/knee extension during stance.    Recommendations for follow up therapy are one component of a multi-disciplinary discharge planning process, led by the attending physician.  Recommendations may be updated based on patient status, additional functional criteria and insurance authorization.  Follow Up Recommendations  Acute inpatient rehab (3hours/day) (we need to find a an acceptable rehab center, pt showing great progress.)     Assistance Recommended at Discharge Frequent or constant Supervision/Assistance  Patient can return home with the following A lot of help with walking and/or transfers;A little help with bathing/dressing/bathroom;Assistance with cooking/housework;Direct supervision/assist for medications management;Direct supervision/assist for financial management;Assist for transportation;Help with stairs or ramp for entrance   Equipment Recommendations  Wheelchair (measurements PT);Wheelchair cushion (measurements PT);Hospital bed    Recommendations for Other Services Rehab consult     Precautions / Restrictions Precautions Precautions: Fall Precaution Comments: L hemiplegia Restrictions Weight  Bearing Restrictions: No     Mobility  Bed Mobility Overal bed mobility: Needs Assistance Bed Mobility: Rolling, Sidelying to Sit Rolling: Mod assist (to the right) Sidelying to sit: Mod assist   Sit to supine: Mod assist   General bed mobility comments: needs assist L side /trunk rolling L to R and truncal assist from R elbow in attempt at normalizing side to sitting.  Also worked on w/shifting/pivot to [employer] forward and also on scooting backward more onto the bed.    Transfers Overall transfer level: Needs assistance Equipment used:  (chair back) Transfers: Sit to/from Stand (x2) Sit to Stand: Min assist, Mod assist     Squat pivot transfers: Mod assist     General transfer comment: worked on coming forward to find his balance point over knees using momentum/rocking and less assist from therapist.  From that point we worked on standing upright or squat pivot transfers to a chair placed on the right    Ambulation/Gait               General Gait Details: not able   Stairs             Wheelchair Mobility    Modified Rankin (Stroke Patients Only) Modified Rankin (Stroke Patients Only) Pre-Morbid Rankin Score: No symptoms Modified Rankin: Severe disability     Balance Overall balance assessment: Needs assistance Sitting-balance support: Feet supported, Single extremity supported, No upper extremity supported   Sitting balance - Comments: pt able to attain sitting EOB with UE assist or external assist, but he can quite easily lose focus and fall L without control.  Pt is unable to sit up from the left outside of his BOS Postural control: Left lateral lean Standing balance support: Single extremity supported, During functional activity, No upper extremity supported Standing balance-[name] Scale: Poor  Standing balance comment: In 2 separate standing trials, pt was able to weakly extend L knee away from therapist's support and maintain standing  very close guard with moderate R UE support on chair back for 10-12 secs.  First session that pt was experiencing any active control of L LE and feeling the balance points                            Cognition Arousal/Alertness: Awake/alert Behavior During Therapy: Flat affect, WFL for tasks assessed/performed Overall Cognitive Status:  (NT formall, but improved awareness and insight)                                          Exercises      General Comments General comments (skin integrity, edema, etc.): vss overall      Pertinent Vitals/Pain Pain Assessment Pain Assessment: Faces Faces Pain Scale: Hurts little more Pain Location: L LE Pain Descriptors / Indicators: Grimacing, Guarding, Spasm Pain Intervention(s): Monitored during session    Home Living                          Prior Function            PT Goals (current goals can now be found in the care plan section) Acute Rehab PT Goals Patient Stated Goal: to regain strength and independence PT Goal Formulation: With patient Time For Goal Achievement: 08/24/21 Potential to Achieve Goals: Good Progress towards PT goals: Progressing toward goals    Frequency    Min 3X/week      PT Plan Current plan remains appropriate    Co-evaluation              AM-PAC PT "6 Clicks" Mobility   Outcome Measure  Help needed turning from your back to your side while in a flat bed without using bedrails?: A Lot Help needed moving from lying on your back to sitting on the side of a flat bed without using bedrails?: A Lot Help needed moving to and from a bed to a chair (including a wheelchair)?: A Lot Help needed standing up from a chair using your arms (e.g., wheelchair or bedside chair)?: A Little Help needed to walk in hospital room?: Total Help needed climbing 3-5 steps with a railing? : Total 6 Click Score: 11    End of Session   Activity Tolerance: Patient tolerated  treatment well (some pain, some fatigue) Patient left: in bed;with call bell/phone within reach;with bed alarm set Nurse Communication: Mobility status PT Visit Diagnosis: Other abnormalities of gait and mobility (R26.89);Muscle weakness (generalized) (M62.81);Other symptoms and signs involving the nervous system (R29.898)     Time: 9798-9211 PT Time Calculation (min) (ACUTE ONLY): 31 min  Charges:  $Therapeutic Activity: 8-22 mins $Neuromuscular Re-education: 8-22 mins                     08/15/2021  Jacinto Halim., PT Acute Rehabilitation Services [phone]  (pager) [phone]  (office)   Eliseo Gum Miki Blank 08/15/2021, 3:53 PM

## 2021-08-16 MED ORDER — BACLOFEN 10 MG PO TABS
30.0000 mg | ORAL_TABLET | Freq: Every day | ORAL | Status: DC
  Administered 2021-08-16 – 2021-08-17 (×2): 30 mg via ORAL
  Filled 2021-08-16 (×2): qty 3

## 2021-08-16 MED ORDER — BACLOFEN 10 MG PO TABS
10.0000 mg | ORAL_TABLET | Freq: Two times a day (BID) | ORAL | Status: DC
  Administered 2021-08-17 – 2021-08-18 (×3): 10 mg via ORAL
  Filled 2021-08-16 (×3): qty 1

## 2021-08-16 NOTE — Progress Notes (Signed)
° °  Subjective Overnight events: none  Pt is seen at bedside during rounds today. Mom and brother is in room with him. Continues to work with PT. He is in good spirits today.   Objective:  Vital signs in last 24 hours: Vitals:   08/16/21 0442 08/16/21 0832 08/16/21 1211 08/16/21 2104  BP: (!) 128/92  128/60   Pulse: 62 (!) 53 60 63  Resp: 16     Temp: 97.6 F (36.4 C) 98 F (36.7 C) 97.9 F (36.6 C) 98.5 F (36.9 C)  TempSrc: Oral Oral Oral Oral  SpO2: 98% 95% 97% 94%  Weight:      Height:       Constitutional: alert, well-appearing, in NAD  Cardiovascular: RRR, no m/r/g, no edema of bilateral LE Pulmonary/Chest: normal work of breathing on room air  Abdominal: soft, non-tender to palpation, non-distended Neurological: A&O x 3, follows commands. Persistent, improved left facial droop and dysarthria.  Patient with persistent left upper extremity and left lower extremity 0 out of 5 strength.  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 No ongoing encephalopathy after baclofen dose reduction. No complaints of spasms today. Working with PT; left sided deficits persist. Remains stable for DC at this time, pending placement. -Increased nighttime baclofen dose to 30 mg and continued 10 mg twice daily yesterday  - ASA 81mg  daily - s/p 3 weeks of Clopidogrel 75mg   - Atorvastatin 80 mg  - PT/OT following  - Denied CIR; SW assisting with SNF placement - Outpatient sleep study recommended  - Discontinue Tele on 3/4 (will be 30 days since admission).   Allergic  rhinitis  - Flonase nasal spray  - Claritin added as a conjunct given delayed affect of Flonase  - Ocean nasal spray   AKI, resolved  - HCTZ discontinued  - Avoid nephrotoxins  - BMP weekly  HTN Mostly normotensive. 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 Improved alertness and bradycardia.  - Continue PO synthroid   - Recheck TSH and free T4 in 4-6 weeks (3/9 - 3/23)   Dry eyes - Artificial tears   Best Practice: Diet: Regular diet  IVF: None,None VTE: Levenox 40 Code: Full  06-04-2006, MD  Internal Medicine Resident, PGY-1 10-18-1974 Internal Medicine Residency  Pager: [phone]

## 2021-08-16 NOTE — Progress Notes (Signed)
° °  Subjective Overnight events: none  Pt is seen at bedside during rounds today.  Patient continues to have muscle spasms despite increasing his baclofen.  He continues to work with physical therapy who is noting some improvement.  Objective:  Vital signs in last 24 hours: Vitals:   08/15/21 2349 08/16/21 0442 08/16/21 0832 08/16/21 1211  BP: 109/72 (!) 128/92  128/60  Pulse: 62 62 (!) 53 60  Resp: 13 16    Temp: 97.6 F (36.4 C) 97.6 F (36.4 C) 98 F (36.7 C) 97.9 F (36.6 C)  TempSrc: Oral Oral Oral Oral  SpO2: 98% 98% 95% 97%  Weight:      Height:       Constitutional: alert, well-appearing, in NAD  Cardiovascular: RRR, no m/r/g, no edema of bilateral LE Pulmonary/Chest: normal work of breathing on room air  Abdominal: soft, non-tender to palpation, non-distended Neurological: A&O x 3, follows commands. Persistent, improved left facial droop and dysarthria.  Patient with persistent left upper extremity and left lower extremity 0 out of 5 strength.  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 No further encephalopathy after baclofen dose reduction. Does complain of some ongoing spasm today. Working with PT; left sided deficits persist. Remains stable for DC at this time, pending placement. -Increase nighttime baclofen dose to 30 mg and continue 10 mg twice daily, discontinue gabapentin - ASA 81mg  daily - Clopidogrel 75mg  daily for 3 weeks - Atorvastatin 80 mg  - PT/OT following  - Denied CIR; SW assisting with SNF placement - Outpatient sleep study  recommended   Allergic rhinitis  - Flonase nasal spray  - Claritin added as a conjunct given delayed affect of Flonase  - Ocean nasal spray   AKI, resolved  - HCTZ discontinued  - Avoid nephrotoxins  -BMP weekly  HTN Mostly normotensive. 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 Improved alertness and bradycardia resolved.  - Continue PO synthroid 122mcg   - Recheck TSH and free T4 in 4-6 weeks (3/9 - 3/23)   Dry eyes - Artificial tears   Best Practice: Diet: Regular diet  IVF: None,None VTE: Levenox 40 Code: Full   Rick Duff, MD PGY-2 Internal Medicine  Pager [phone]

## 2021-08-17 NOTE — Progress Notes (Signed)
° °  Subjective Overnight events: none  Pt is seen at bedside during rounds today. Complains of ongoing spasms today. Continues to work with PT. He is in good spirits today.   Objective:  Vital signs in last 24 hours: Vitals:   08/16/21 2104 08/17/21 0513 08/17/21 0810 08/17/21 1110  BP:   140/82 126/60  Pulse: 63  (!) 56 60  Resp:   16 16  Temp: 98.5 F (36.9 C) 97.8 F (36.6 C)  98 F (36.7 C)  TempSrc: Oral Oral  Oral  SpO2: 94% 99% 99% 96%  Weight:      Height:       Constitutional: alert, well-appearing, in NAD  Cardiovascular: RRR, no m/r/g, no edema of bilateral LE Pulmonary/Chest: normal work of breathing on room air  Abdominal: soft, non-tender to palpation, non-distended Neurological: A&O x 3, follows commands. Persistent, improved left facial droop and dysarthria.  Patient with persistent left upper extremity and left lower extremity 0 out of 5 strength.  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 No ongoing encephalopathy after baclofen dose reduction. No complaints of spasms today. Working with PT; left sided deficits persist. Remains stable for DC at this time, pending placement. -Increased nighttime baclofen dose to 40 mg and continued 10 mg twice daily yesterday  - Added Lyrica 50 TID prn  - ASA 81mg  daily - s/p 3 weeks of Clopidogrel 75mg   - Atorvastatin 80 mg  - PT/OT following  - Denied CIR; SW assisting with SNF placement - Outpatient sleep study recommended  - Discontinue Tele on 3/4 (will be 30 days since admission).   Allergic  rhinitis  - Flonase nasal spray  - Claritin added as a conjunct given delayed affect of Flonase  - Ocean nasal spray   AKI, resolved  - HCTZ discontinued  - Avoid nephrotoxins  - BMP weekly  HTN Mostly normotensive. 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 Improved alertness and bradycardia.  - Continue PO synthroid   - Recheck TSH and free T4 in 4-6 weeks (3/9 - 3/23)   Dry eyes - Artificial tears   Best Practice: Diet: Regular diet  IVF: None,None VTE: Levenox 40 Code: Full  06-04-2006, MD  Internal Medicine Resident, PGY-1 10-18-1974 Internal Medicine Residency  Pager: [phone]

## 2021-08-17 NOTE — Progress Notes (Signed)
Occupational Therapy Treatment Patient Details Name: Jason Figueroa MRN: [id number] DOB: [birth year] Today's Date: 08/17/2021   History of present illness 56 y.o. male presents to [facility] hospital on 07/25/2021 with numbness and tingling on left side of face along with L sided weakness. 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 PMH inlcudes HTN, previous stroke,  and recent TIA.   OT comments  Patient received in bed and stated he wet his bed attempting to use urinal.  Patient was mod assist to get to EOB and performed stand pivot transfer into recliner with face to face technique. Patient was able to assist with bathing legs and peri area while seated and stood at sink to bathe bottom with max assist.  Therapist performed PROM to LUE with verbal cues to facilitate active movement with none noted.  Patient RHS donned on LUE.  Acute OT to continue to follow.     Recommendations for follow up therapy are one component of a multi-disciplinary discharge planning process, led by the attending physician.  Recommendations may be updated based on patient status, additional functional criteria and insurance authorization.    Follow Up Recommendations  Skilled nursing-short term rehab (<3 hours/day)    Assistance Recommended at Discharge Frequent or constant Supervision/Assistance  Patient can return home with the following  A lot of help with walking and/or transfers;A lot of help with bathing/dressing/bathroom;Assist for transportation;Direct supervision/assist for financial management;Direct supervision/assist for medications management;Assistance with cooking/housework;Help with stairs or ramp for entrance   Equipment Recommendations  BSC/3in1;Wheelchair cushion (measurements OT);Wheelchair (measurements OT);Tub/shower bench    Recommendations for Other Services       Precautions / Restrictions Precautions Precautions: Fall Precaution Comments: L hemiplegia Restrictions Weight Bearing Restrictions: No       Mobility Bed Mobility Overal bed mobility: Needs Assistance Bed Mobility: Rolling, Sidelying to Sit Rolling: Mod assist Sidelying to sit: Mod assist       General bed mobility comments: required assist with left side for rolling, getting LEs off side of bed, and raising trunk    Transfers Overall transfer level: Needs assistance Equipment used: None Transfers: Sit to/from Stand, Bed to chair/wheelchair/BSC Sit to Stand: Mod assist Stand pivot transfers: Mod assist         General transfer comment: stand pivot transfer performed with face to face technique to block LLE knee     Balance Overall balance assessment: Needs assistance Sitting-balance support: Feet supported, Single extremity supported, No upper extremity supported Sitting balance-[name] Scale: Poor Sitting balance - Comments: RUE support for sitting balance on EOB Postural control: Left lateral lean Standing balance support: Single extremity supported, During functional activity, No upper extremity supported Standing balance-[name] Scale: Poor Standing balance comment: Stood at sink with sink support with RUE and therapist assisting with balance                           ADL either performed or assessed with clinical judgement   ADL Overall ADL's : Needs assistance/impaired     Grooming: Wash/dry hands;Wash/dry face;Minimal assistance;Sitting Grooming Details (indicate cue type and reason): seated in recliner     Lower Body Bathing: Maximal assistance;Sit to/from stand Lower Body Bathing Details (indicate cue type and reason): Patient able to bathe peri area seated in recliner and stood at sink to bath bottom with max assist Upper Body Dressing :  Moderate assistance;Sitting Upper Body Dressing Details (indicate cue type and reason): donned new  gown seated in recliner                   General ADL Comments: patient had wet bed attempting to use urinal and performed clean up in recliner and standing at sink    Extremity/Trunk Assessment Upper Extremity Assessment LUE Deficits / Details: hemiplegic, hemiparesis LUE Sensation: decreased light touch;decreased proprioception LUE Coordination: decreased fine motor;decreased gross motor            Vision       Perception     Praxis      Cognition Arousal/Alertness: Awake/alert Behavior During Therapy: Flat affect, WFL for tasks assessed/performed Overall Cognitive Status: Impaired/Different from baseline Area of Impairment: Attention, Awareness, Problem solving, Safety/judgement                   Current Attention Level: Selective   Following Commands: Follows one step commands consistently Safety/Judgement: Decreased awareness of safety, Decreased awareness of deficits Awareness: Emergent Problem Solving: Slow processing, Decreased initiation General Comments: concerned over time it will take to get stronge, how much he will get back, if he would walk again        Exercises Exercises: General Upper Extremity General Exercises - Upper Extremity Shoulder Flexion: PROM, Left, 10 reps, Seated Shoulder Extension: PROM, Left, 10 reps, Seated Shoulder ABduction: PROM, 10 reps, Left, Seated Shoulder ADduction: PROM, Left, 10 reps, Seated Elbow Flexion: PROM, Left, 10 reps, Seated Elbow Extension: PROM, Left, 10 reps, Seated Wrist Flexion: PROM, Left, 10 reps, Seated Wrist Extension: PROM, Left, 10 reps, Seated Digit Composite Flexion: PROM, Left, 10 reps, Seated Composite Extension: PROM, Left, 10 reps, Seated    Shoulder Instructions       General Comments      Pertinent Vitals/ Pain       Pain Assessment Pain Assessment: Faces Faces Pain Scale: Hurts little more Pain Location: LLE left thumb Pain Descriptors / Indicators: Grimacing,  Guarding Pain Intervention(s): Limited activity within patient's tolerance, Monitored during session, Repositioned  Home Living                                          Prior Functioning/Environment              Frequency  Min 2X/week        Progress Toward Goals  OT Goals(current goals can now be found in the care plan section)  Progress towards OT goals: Progressing toward goals  Acute Rehab OT Goals Patient Stated Goal: get stronger OT Goal Formulation: With patient Time For Goal Achievement: 08/27/21 Potential to Achieve Goals: Good ADL Goals Pt Will Perform Grooming: with supervision;sitting Pt Will Perform Upper Body Dressing: with set-up;sitting Pt Will Perform Lower Body Dressing: with min guard assist;sit to/from stand;sitting/lateral leans Pt Will Transfer to Toilet: with min guard assist;bedside commode;squat pivot transfer Additional ADL Goal #1: PT will complete bed mobility with S in preparation for ADL tasks Additional ADL Goal #2: Pt will independently complete self ROM  to maintain LUE [facility] for ADL tasks Additional ADL Goal #3: Pt will tolerate L resting hadn splint during night time hours without complications to protect L hand from injust and reduce risk of contracture development  Plan Discharge plan remains appropriate;Frequency remains appropriate    Co-evaluation  AM-PAC OT "6 Clicks" Daily Activity     Outcome Measure   Help from another person eating meals?: A Little Help from another person taking care of personal grooming?: A Little Help from another person toileting, which includes using toliet, bedpan, or urinal?: A Lot Help from another person bathing (including washing, rinsing, drying)?: A Lot Help from another person to put on and taking off regular upper body clothing?: A Lot Help from another person to put on and taking off regular lower body clothing?: A Lot 6 Click Score: 14    End of  Session Equipment Utilized During Treatment: Gait belt  OT Visit Diagnosis: Unsteadiness on feet (R26.81);Other abnormalities of gait and mobility (R26.89);Muscle weakness (generalized) (M62.81);Pain;Hemiplegia and hemiparesis;Feeding difficulties (R63.3) Hemiplegia - Right/Left: Left Hemiplegia - dominant/non-dominant: Non-Dominant Hemiplegia - caused by: Cerebral infarction Pain - Right/Left: Left Pain - part of body: Leg   Activity Tolerance Patient tolerated treatment well   Patient Left in chair;with call bell/phone within reach;with chair alarm set   Nurse Communication Mobility status        Time: 9485-4627 OT Time Calculation (min): 25 min  Charges: OT General Charges $OT Visit: 1 Visit OT Treatments $Self Care/Home Management : 8-22 mins $Neuromuscular Re-education: 8-22 mins  Alfonse Flavors, OTA Acute Rehabilitation Services  Pager [phone] Office [phone]   Dewain Penning 08/17/2021, 2:52 PM

## 2021-08-18 MED ORDER — PREGABALIN 50 MG PO CAPS
50.0000 mg | ORAL_CAPSULE | Freq: Three times a day (TID) | ORAL | Status: DC | PRN
  Administered 2021-08-19: 50 mg via ORAL
  Filled 2021-08-18: qty 1

## 2021-08-18 MED ORDER — BACLOFEN 10 MG PO TABS
40.0000 mg | ORAL_TABLET | Freq: Every day | ORAL | Status: DC
  Administered 2021-08-18 – 2021-08-19 (×2): 40 mg via ORAL
  Filled 2021-08-18 (×2): qty 4

## 2021-08-18 MED ORDER — BACLOFEN 10 MG PO TABS
10.0000 mg | ORAL_TABLET | Freq: Two times a day (BID) | ORAL | Status: DC
  Administered 2021-08-18 – 2021-08-20 (×4): 10 mg via ORAL
  Filled 2021-08-18 (×4): qty 1

## 2021-08-18 NOTE — Progress Notes (Signed)
Physical Therapy Treatment Patient Details Name: Jason Figueroa MRN: [id number] DOB: Jun 05, 1966 Today's Date: 08/18/2021   History of Present Illness 56 y.o. male presents to [facility] hospital on 07/25/2021 with numbness and tingling on left side of face along with L sided weakness. 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 PMH inlcudes HTN, previous stroke,  and recent TIA.    PT Comments    Pt progressing well both cognitively and functionally. Pt remains to have L hemiparesis but is trying to problem solving functioning with R UE only. Pt with improved ability to maintain EOB balance and to transfer sit to stand to step to pvt to chair. Pt to strongly benefit from AIR upon d/c for maximal functional recovery. Acute PT to cont to follow.    Recommendations for follow up therapy are one component of a multi-disciplinary discharge planning process, led by the attending physician.  Recommendations may be updated based on patient status, additional functional criteria and insurance authorization.  Follow Up Recommendations  Acute inpatient rehab (3hours/day)     Assistance Recommended at Discharge Frequent or constant Supervision/Assistance  Patient can return home with the following A lot of help with walking and/or transfers;A little help with bathing/dressing/bathroom;Assistance with cooking/housework;Direct supervision/assist for medications management;Direct supervision/assist for financial management;Assist for transportation;Help with stairs or ramp for entrance   Equipment Recommendations  Wheelchair (measurements PT);Wheelchair cushion (measurements PT);Hospital bed    Recommendations for Other Services Rehab consult     Precautions / Restrictions Precautions Precautions: Fall Precaution Comments: L hemiplegia Restrictions Weight Bearing  Restrictions: No     Mobility  Bed Mobility Overal bed mobility: Needs Assistance Bed Mobility: Supine to Sit     Supine to sit: Mod assist     General bed mobility comments: pt pulled up on PT, modA to bring hips around to EOB    Transfers Overall transfer level: Needs assistance Equipment used: None Transfers: Sit to/from Stand, Bed to chair/wheelchair/BSC Sit to Stand: Mod assist Stand pivot transfers: Mod assist         General transfer comment: completed 3 sit to stand with L knee blocked. Pt able to complete step pvt with L knee blocked    Ambulation/Gait Ambulation/Gait assistance:  (deferred 2/2 high falls risk)             General Gait Details: not able   Stairs             Wheelchair Mobility    Modified Rankin (Stroke Patients Only) Modified Rankin (Stroke Patients Only) Pre-Morbid Rankin Score: No symptoms Modified Rankin: Severe disability     Balance Overall balance assessment: Needs assistance Sitting-balance support: Feet supported, Single extremity supported, No upper extremity supported Sitting balance-[name] Scale: Poor Sitting balance - Comments: pt would lean to the L, verbal cues to correct, pt able to correct difficulty maintaining midline with onset of fatigue Postural control: Left lateral lean Standing balance support: Single extremity supported, During functional activity, No upper extremity supported Standing balance-[name] Scale: Poor Standing balance comment: stood at bedside holding onto back of chair, L knee blocked                            Cognition Arousal/Alertness: Awake/alert Behavior During Therapy: Flat affect, WFL for tasks assessed/performed Overall Cognitive Status: Impaired/Different from baseline Area of Impairment: Awareness  Current Attention Level: Selective   Following Commands: Follows one step commands consistently   Awareness: Emergent Problem Solving:  Slow processing, Requires verbal cues, Requires tactile cues General Comments: pt demo'd problem solving by using teeth to open sock package, pt aware of L hemiplegia and in depressed spirits about being "so handicapped" and not being able to help his mother        Exercises Other Exercises Other Exercises: worked on weight shifting to the L in standing x 3 trials Other Exercises: mini squats x 10 with increased WS on L LE to promote stimulation of L LE muscles    General Comments General comments (skin integrity, edema, etc.): VSS, L resting hand splint donned back on      Pertinent Vitals/Pain Pain Assessment Pain Assessment: Faces Faces Pain Scale: Hurts little more Pain Location: L UE and LE Pain Descriptors / Indicators: Grimacing, Guarding    Home Living                          Prior Function            PT Goals (current goals can now be found in the care plan section) Acute Rehab PT Goals Patient Stated Goal: to regain strength and independence PT Goal Formulation: With patient Time For Goal Achievement: 08/24/21 Potential to Achieve Goals: Good Progress towards PT goals: Progressing toward goals    Frequency    Min 3X/week      PT Plan Current plan remains appropriate    Co-evaluation              AM-PAC PT "6 Clicks" Mobility   Outcome Measure  Help needed turning from your back to your side while in a flat bed without using bedrails?: A Lot Help needed moving from lying on your back to sitting on the side of a flat bed without using bedrails?: A Lot Help needed moving to and from a bed to a chair (including a wheelchair)?: A Lot Help needed standing up from a chair using your arms (e.g., wheelchair or bedside chair)?: A Lot Help needed to walk in hospital room?: Total Help needed climbing 3-5 steps with a railing? : Total 6 Click Score: 10    End of Session Equipment Utilized During Treatment: Gait belt Activity Tolerance:  Patient tolerated treatment well (some pain, some fatigue) Patient left: with call bell/phone within reach;in chair;with chair alarm set;with nursing/sitter in room Nurse Communication: Mobility status PT Visit Diagnosis: Other abnormalities of gait and mobility (R26.89);Muscle weakness (generalized) (M62.81);Other symptoms and signs involving the nervous system (R29.898)     Time: 9528-4132 PT Time Calculation (min) (ACUTE ONLY): 39 min  Charges:  $Therapeutic Exercise: 8-22 mins $Therapeutic Activity: 8-22 mins $Neuromuscular Re-education: 8-22 mins                     Lewis Shock, PT, DPT Acute Rehabilitation Services Pager #: [phone] Office #: [phone]    Iona Hansen 08/18/2021, 2:34 PM

## 2021-08-19 NOTE — Progress Notes (Signed)
Occupational Therapy Treatment ?Patient Details ?Name: Jason Figueroa ?MRN: [id number] ?DOB: 05-May-1966 ?Today's Date: 08/19/2021 ? ? ?History of present illness 56 y.o. male presents to [facility] hospital on 07/25/2021 with numbness and tingling on left side of face along with L sided weakness. 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 PMH inlcudes HTN, previous stroke,  and recent TIA. ?  ?OT comments ? Patient received in bed and agreeable to OT treatment. Patient was mod assist to get to EOB and for stand pivot transfer to recliner.  Patient was setup and min assist for grooming with education on one handed technique for loading toothpaste.  Patient educated on SROM for LUE with min assist for shoulder range and verbal cues to perfrom all exercises. Acute OT to continue to follow.   ? ?Recommendations for follow up therapy are one component of a multi-disciplinary discharge planning process, led by the attending physician.  Recommendations may be updated based on patient status, additional functional criteria and insurance authorization. ?   ?Follow Up Recommendations ? Skilled nursing-short term rehab (<3 hours/day)  ?  ?Assistance Recommended at Discharge Frequent or constant Supervision/Assistance  ?Patient can return home with the following ? A lot of help with walking and/or transfers;A lot of help with bathing/dressing/bathroom;Assist for transportation;Direct supervision/assist for financial management;Direct supervision/assist for medications management;Assistance with cooking/housework;Help with stairs or ramp for entrance ?  ?Equipment Recommendations ? BSC/3in1;Wheelchair cushion (measurements OT);Wheelchair (measurements OT);Tub/shower bench  ?  ?Recommendations for Other Services   ? ?  ?Precautions / Restrictions Precautions ?Precautions: Fall ?Precaution Comments: L  hemiplegia ?Restrictions ?Weight Bearing Restrictions: No  ? ? ?  ? ?Mobility Bed Mobility ?Overal bed mobility: Needs Assistance ?Bed Mobility: Supine to Sit ?  ?  ?Supine to sit: Mod assist ?  ?  ?General bed mobility comments: education on rail use and assistance needed with BLEs ?  ? ?Transfers ?Overall transfer level: Needs assistance ?Equipment used: None ?Transfers: Sit to/from Stand, Bed to chair/wheelchair/BSC ?Sit to Stand: Mod assist ?Stand pivot transfers: Mod assist ?  ?  ?  ?  ?General transfer comment: stand pivot transfer to recliner with face to face technique to allow for LLE knee to be blocked ?  ?  ?Balance Overall balance assessment: Needs assistance ?Sitting-balance support: Feet supported, Single extremity supported, No upper extremity supported ?Sitting balance-[name] Scale: Poor ?Sitting balance - Comments: left lateral leaning while seated on EOB with min assist ?Postural control: Left lateral lean ?Standing balance support: Single extremity supported, During functional activity, No upper extremity supported ?Standing balance-[name] Scale: Poor ?Standing balance comment: stood for transfer ?  ?  ?  ?  ?  ?  ?  ?  ?  ?  ?  ?   ? ?ADL either performed or assessed with clinical judgement  ? ?ADL Overall ADL's : Needs assistance/impaired ?  ?  ?Grooming: Wash/dry hands;Wash/dry face;Oral care;Minimal assistance;Sitting ?Grooming Details (indicate cue type and reason): performed seated in recliner with education on one handed technique for applying toothpaste ?  ?  ?  ?  ?  ?  ?  ?  ?  ?  ?  ?  ?  ?  ?  ?General ADL Comments: grooming performed seated in recliner ?  ? ?Extremity/Trunk Assessment Upper Extremity Assessment ?LUE Deficits / Details: hemiplegic, hemiparesis ?LUE Sensation: decreased light touch;decreased proprioception ?LUE Coordination: decreased fine motor;decreased  gross motor ?  ?  ?  ?  ?  ? ?Vision   ?  ?  ?Perception   ?  ?Praxis   ?  ? ?Cognition Arousal/Alertness:  Awake/alert ?Behavior During Therapy: Flat affect, WFL for tasks assessed/performed ?Overall Cognitive Status: Impaired/Different from baseline ?Area of Impairment: Awareness ?  ?  ?  ?  ?  ?  ?  ?  ?  ?Current Attention Level: Selective ?  ?Following Commands: Follows one step commands consistently ?  ?Awareness: Emergent ?Problem Solving: Slow processing, Requires verbal cues, Requires tactile cues ?General Comments: concerned lack of progress per his expectations ?  ?  ?   ?Exercises Exercises: General Upper Extremity ?General Exercises - Upper Extremity ?Shoulder Flexion: Self ROM, Left, 10 reps, Seated ?Shoulder Extension: Self ROM, Left, 10 reps, Seated ?Shoulder ABduction: Self ROM, Left, 10 reps, Seated ?Shoulder ADduction: Self ROM, Left, 10 reps, Seated ?Shoulder Horizontal ABduction: Self ROM, Left, 10 reps, Seated ?Elbow Flexion: Self ROM, 10 reps, Seated ?Elbow Extension: Self ROM, Left, 10 reps, Seated ?Wrist Flexion: Self ROM, Left, 10 reps, Seated ?Wrist Extension: Self ROM, Left, 10 reps, Seated ?Digit Composite Flexion: Self ROM, Left, 10 reps, Seated ?Composite Extension: Self ROM, Left, 10 reps, Seated ? ?  ?Shoulder Instructions   ? ? ?  ?General Comments    ? ? ?Pertinent Vitals/ Pain       Pain Assessment ?Pain Assessment: No/denies pain ? ?Home Living   ?  ?  ?  ?  ?  ?  ?  ?  ?  ?  ?  ?  ?  ?  ?  ?  ?  ?  ? ?  ?Prior Functioning/Environment    ?  ?  ?  ?   ? ?Frequency ? Min 2X/week  ? ? ? ? ?  ?Progress Toward Goals ? ?OT Goals(current goals can now be found in the care plan section) ? Progress towards OT goals: Progressing toward goals ? ?Acute Rehab OT Goals ?Patient Stated Goal: get stronger ?OT Goal Formulation: With patient ?Time For Goal Achievement: 08/27/21 ?Potential to Achieve Goals: Good ?ADL Goals ?Pt Will Perform Grooming: with supervision;sitting ?Pt Will Perform Upper Body Dressing: with set-up;sitting ?Pt Will Perform Lower Body Dressing: with min guard assist;sit to/from  stand;sitting/lateral leans ?Pt Will Transfer to Toilet: with min guard assist;bedside commode;squat pivot transfer ?Additional ADL Goal #1: PT will complete bed mobility with S in preparation for ADL tasks ?Additional ADL Goal #2: Pt will independently complete self ROM  to maintain LUE [facility] for ADL tasks ?Additional ADL Goal #3: Pt will tolerate L resting hadn splint during night time hours without complications to protect L hand from injust and reduce risk of contracture development  ?Plan Discharge plan remains appropriate;Frequency remains appropriate   ? ?Co-evaluation ? ? ?   ?  ?  ?  ?  ? ?  ?AM-PAC OT "6 Clicks" Daily Activity     ?Outcome Measure ? ? Help from another person eating meals?: A Little ?Help from another person taking care of personal grooming?: A Little ?Help from another person toileting, which includes using toliet, bedpan, or urinal?: A Lot ?Help from another person bathing (including washing, rinsing, drying)?: A Lot ?Help from another person to put on and taking off regular upper body clothing?: A Lot ?Help from another person to put on and taking off regular lower body clothing?: A Lot ?6 Click Score: 14 ? ?  ?End of Session Equipment  Utilized During Treatment: Gait belt ? ?OT Visit Diagnosis: Unsteadiness on feet (R26.81);Other abnormalities of gait and mobility (R26.89);Muscle weakness (generalized) (M62.81);Pain;Hemiplegia and hemiparesis;Feeding difficulties (R63.3) ?Hemiplegia - Right/Left: Left ?Hemiplegia - dominant/non-dominant: Non-Dominant ?Hemiplegia - caused by: Cerebral infarction ?  ?Activity Tolerance Patient tolerated treatment well ?  ?Patient Left in chair;with call bell/phone within reach;with chair alarm set ?  ?Nurse Communication Mobility status ?  ? ?   ? ?Time: 7989-2119 ?OT Time Calculation (min): 28 min ? ?Charges: OT General Charges ?$OT Visit: 1 Visit ?OT Treatments ?$Self Care/Home Management : 8-22 mins ?$Therapeutic Exercise: 8-22 mins ? ?Alfonse Flavors,  OTA ?Acute Rehabilitation Services  ?Pager [phone] ?Office [phone] ? ? ?Taggart Prasad Jeannett Senior ?08/19/2021, 12:56 PM ?

## 2021-08-19 NOTE — Progress Notes (Signed)
? ?  Subjective ?Overnight events: none ? ?Pt is seen at bedside during rounds today. Reports improvement in LLE and LUE spasms today. Continues to work with PT. He is in good spirits today.  ? ?Objective: ? ?Vital signs in last 24 hours: ?Vitals:  ? 08/18/21 1634 08/18/21 2031 08/18/21 2323 08/19/21 0318  ?BP: 128/78     ?Pulse: 62 70 69 72  ?Resp: 12 16    ?Temp: 97.8 ?F (36.6 ?C) 98.1 ?F (36.7 ?C) 98.6 ?F (37 ?C) 97.9 ?F (36.6 ?C)  ?TempSrc: Oral Oral Oral Oral  ?SpO2: 96% 95% 96% 97%  ?Weight:      ?Height:      ? ?Constitutional: alert, well-appearing, in NAD  ?Cardiovascular: RRR, no m/r/g, no edema of bilateral LE ?Pulmonary/Chest: normal work of breathing on room air; CTAB anteriorly, as patient with great difficulty leaning forward. ?Abdominal: soft, non-tender to palpation, non-distended ?Neurological: A&O x 3, follows commands. Persistent, improved left facial droop and dysarthria.  Patient with persistent left upper extremity and left lower extremity 0 out of 5 strength. ?Skin: Normal skin turgor ?GU: Condom catheter in place with urine appearing normal in color ? ?Assessment/Plan: ? ?Principal Problem: ?  Stroke [facility]) ?Active Problems: ?  Left-sided weakness ?  AKI (acute kidney injury) (HCC) ?  OSA (obstructive sleep apnea) ? 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 No ongoing encephalopathy after baclofen dose reduction. No complaints of spasms today. Working with PT; left sided deficits persist. Remains stable for DC at this time, pending placement. ?-Continue baclofen 10 mg BID and 40 mg qHS; watch for pseudobulbar affect as dosages increase. Patient previously d/c Baclofen 40mg  TID d/t poor emotional regulation ?- Continue Lyrica 50 TID  prn  ?- ASA 81mg  daily ?- s/p 3 weeks of Clopidogrel 75mg   ?- Atorvastatin 80 mg  ?- PT/OT following  ?- Denied CIR; SW assisting with SNF placement ?- Outpatient sleep study recommended  ?- Discontinue Tele on 3/4 (will be 30 days since admission).  ? ?Allergic rhinitis  ?- Flonase nasal spray  ?- Claritin added as a conjunct given delayed affect of Flonase  ?- Ocean nasal spray  ? ?AKI, resolved  ?- HCTZ discontinued  ?- Avoid nephrotoxins  ?- BMP weekly ? ?HTN ?Mostly normotensive. ?- Continue Lisinopril 40 mg and amlodipine 10 mg ?- Continue Hydralazine 10 mg TID; HR does not allow for BB ?- HCTZ discontinued given AKI after starting   ? 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 Improved alertness and bradycardia.  ?- Continue PO synthroid   ?- Recheck TSH and free T4 in 4-6 weeks (3/9 - 3/23)  ? ?Dry eyes ?- Artificial tears  ? ?Best Practice: ?Diet: Regular diet  ?IVF: None,None ?VTE: Levenox 40 ?Code: Full ? ? , MD  ?Internal Medicine/Psych Resident, PGY-1 ?06-04-2006 Internal Medicine Residency  ?Pager: [phone] ?

## 2021-08-19 NOTE — Progress Notes (Signed)
Physical Therapy Treatment ?Patient Details ?Name: Jason Figueroa ?MRN: [id number] ?DOB: 1966-03-15 ?Today's Date: 08/19/2021 ? ? ?History of Present Illness 56 y.o. male presents to [facility] hospital on 07/25/2021 with numbness and tingling on left side of face along with L sided weakness. 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 PMH inlcudes HTN, previous stroke,  and recent TIA. ? ?  ?PT Comments  ? ? Pt starting out a little more fatigued than other days.  Still showing good progression toward goals limited by L sided plegia.  Emphasis on transitions via R elbow to sitting, sitting balance between standing trials, preparing foundation for standing/transfers, sit to stands x6 and squat pivot x4 after work on coming forward. ?   ?Recommendations for follow up therapy are one component of a multi-disciplinary discharge planning process, led by the attending physician.  Recommendations may be updated based on patient status, additional functional criteria and insurance authorization. ? ?Follow Up Recommendations ? Acute inpatient rehab (3hours/day) ?  ?  ?Assistance Recommended at Discharge Frequent or constant Supervision/Assistance  ?Patient can return home with the following A lot of help with walking and/or transfers;A little help with bathing/dressing/bathroom;Assistance with cooking/housework;Direct supervision/assist for medications management;Direct supervision/assist for financial management;Assist for transportation;Help with stairs or ramp for entrance ?  ?Equipment Recommendations ? Wheelchair (measurements PT);Wheelchair cushion (measurements PT);Hospital bed  ?  ?Recommendations for Other Services Rehab consult ? ? ?  ?Precautions / Restrictions Precautions ?Precautions: Fall ?Precaution Comments: L hemiplegia  ?  ? ?Mobility ? Bed Mobility ?Overal bed mobility: Needs  Assistance ?Bed Mobility: Rolling, Sidelying to Sit ?Rolling: Mod assist ?Sidelying to sit: Mod assist ?  ?Sit to supine: Mod assist ?  ?General bed mobility comments: cues for technique/sequencing, light to heavier moderate assist bringing L side to the Right and LE's back into bed. ?  ? ?Transfers ?Overall transfer level: Needs assistance ?Equipment used:  (chair back) ?Transfers: Sit to/from Stand, Bed to chair/wheelchair/BSC ?Sit to Stand: Mod assist (x6, having a harder time attaining the balance point coming forward without significant assist) ?  ?  ?Squat pivot transfers: Mod assist (x4) ?  ?  ?General transfer comment: work on coming more forward then work on standing or squat pivot with moderate assist and blocking L LE in general.  1-2 episodes where pt held knee control weakly. ?  ? ?Ambulation/Gait ?  ?  ?  ?  ?  ?  ?  ?General Gait Details: not able ? ? ?Stairs ?  ?  ?  ?  ?  ? ? ?Wheelchair Mobility ?  ? ?Modified Rankin (Stroke Patients Only) ?Modified Rankin (Stroke Patients Only) ?Modified Rankin: Severe disability ? ? ?  ?Balance   ?Sitting-balance support: Feet supported, Single extremity supported, No upper extremity supported ?Sitting balance-[name] Scale: Poor ?Sitting balance - Comments: left lateral leaning while seated on EOB with min assist ?Postural control: Left lateral lean ?Standing balance support: Single extremity supported, During functional activity, No upper extremity supported ?Standing balance-[name] Scale: Poor ?Standing balance comment: standing for balance x6 ?  ?  ?  ?  ?  ?  ?  ?  ?  ?  ?  ?  ? ?  ?Cognition Arousal/Alertness: Awake/alert ?Behavior During Therapy: Flat affect, WFL for tasks assessed/performed ?Overall Cognitive Status:  (NT formally) ?  ?  ?  ?  ?  ?  ?  ?  ?  ?  ?  ?  ?  ?  ?  ?  ?  ?  ?  ? ?  ?  Exercises   ? ?  ?General Comments   ?  ?  ? ?Pertinent Vitals/Pain Pain Assessment ?Pain Assessment: Faces ?Faces Pain Scale: Hurts little more ?Pain Location: L  flank ?Pain Descriptors / Indicators: Grimacing, Guarding ?Pain Intervention(s): Monitored during session  ? ? ?Home Living   ?  ?  ?  ?  ?  ?  ?  ?  ?  ?   ?  ?Prior Function    ?  ?  ?   ? ?PT Goals (current goals can now be found in the care plan section) Acute Rehab PT Goals ?PT Goal Formulation: With patient ?Time For Goal Achievement: 08/24/21 ?Potential to Achieve Goals: Good ?Progress towards PT goals: Progressing toward goals ? ?  ?Frequency ? ? ? Min 3X/week ? ? ? ?  ?PT Plan Current plan remains appropriate  ? ? ?Co-evaluation   ?  ?  ?  ?  ? ?  ?AM-PAC PT "6 Clicks" Mobility   ?Outcome Measure ? Help needed turning from your back to your side while in a flat bed without using bedrails?: A Lot ?Help needed moving from lying on your back to sitting on the side of a flat bed without using bedrails?: A Lot ?Help needed moving to and from a bed to a chair (including a wheelchair)?: A Lot ?Help needed standing up from a chair using your arms (e.g., wheelchair or bedside chair)?: A Lot ?Help needed to walk in hospital room?: Total ?Help needed climbing 3-5 steps with a railing? : Total ?6 Click Score: 10 ? ?  ?End of Session   ?Activity Tolerance: Patient tolerated treatment well ?Patient left: in bed;with call bell/phone within reach ?Nurse Communication: Mobility status ?PT Visit Diagnosis: Other abnormalities of gait and mobility (R26.89);Muscle weakness (generalized) (M62.81);Other symptoms and signs involving the nervous system (R29.898) ?  ? ? ?Time: 1630-1700 ?PT Time Calculation (min) (ACUTE ONLY): 30 min ? ?Charges:  $Therapeutic Activity: 8-22 mins ?$Neuromuscular Re-education: 8-22 mins          ?          ? ?08/19/2021 ? ?Ginger Carne., PT ?Acute Rehabilitation Services ?[phone]  (pager) ?[phone]  (office) ? ? ?Jason Figueroa ?08/19/2021, 5:10 PM ? ?

## 2021-08-20 MED ORDER — DULOXETINE HCL 30 MG PO CPEP
30.0000 mg | ORAL_CAPSULE | Freq: Every day | ORAL | Status: DC
  Administered 2021-08-20 – 2021-09-11 (×23): 30 mg via ORAL
  Filled 2021-08-20 (×23): qty 1

## 2021-08-20 MED ORDER — BACLOFEN 10 MG PO TABS
40.0000 mg | ORAL_TABLET | Freq: Every day | ORAL | Status: DC
  Administered 2021-08-20: 40 mg via ORAL
  Filled 2021-08-20: qty 4

## 2021-08-20 MED ORDER — TRAZODONE HCL 50 MG PO TABS
50.0000 mg | ORAL_TABLET | Freq: Every evening | ORAL | Status: DC | PRN
  Administered 2021-08-20 – 2021-08-21 (×3): 50 mg via ORAL
  Filled 2021-08-20 (×4): qty 1

## 2021-08-20 MED ORDER — PREGABALIN 50 MG PO CAPS
50.0000 mg | ORAL_CAPSULE | Freq: Every day | ORAL | Status: DC
  Administered 2021-08-20: 50 mg via ORAL
  Filled 2021-08-20: qty 1

## 2021-08-20 MED ORDER — BACLOFEN 10 MG PO TABS
10.0000 mg | ORAL_TABLET | Freq: Two times a day (BID) | ORAL | Status: DC
  Administered 2021-08-20: 10 mg via ORAL
  Filled 2021-08-20: qty 1

## 2021-08-20 NOTE — Progress Notes (Signed)
? ?Subjective ?Overnight events: none ? ?Pt is seen at bedside during rounds today. Reports sustained improvement in LLE and LUE spasms today. He says that the spasms come on at night, between dinner and bedtime and subside while asleep. Does endorse decreased motivation to work on improving his strength while in the room alone, but adequate motivation to work with PT. He also reports difficulty sleeping. We talk at length about the adjustment to life after having a stroke with deficits, and discuss anti-depressant therapy to help with his low mood and neuropathic pain. He is appreciative of the time spent in conversation and agreeable to medications.   ? ?Objective: ? ?Vital signs in last 24 hours: ?Vitals:  ? 08/19/21 2003 08/19/21 2319 08/20/21 0401 08/20/21 1050  ?BP: 135/69 132/67 118/63 134/61  ?Pulse: 65 (!) 59 (!) 51 (!) 57  ?Resp:  18 19 18   ?Temp: 98.1 ?F (36.7 ?C) 97.9 ?F (36.6 ?C) 97.7 ?F (36.5 ?C) 98 ?F (36.7 ?C)  ?TempSrc: Oral Oral Oral Oral  ?SpO2: 97% 97% 98% 97%  ?Weight:      ?Height:      ? ?Constitutional: alert, well-appearing, in NAD  ?Cardiovascular: RRR, no m/r/g, no edema of bilateral LE ?Pulmonary/Chest: normal work of breathing on room air; CTAB anteriorly, as patient with great difficulty leaning forward. ?Abdominal: soft, non-tender to palpation, non-distended ?Neurological: A&O x 3, follows commands. Persistent, improved left facial droop and dysarthria.  Patient with persistent left upper extremity and left lower extremity 0 out of 5 strength. ?Skin: Normal skin turgor ?GU: Condom catheter in place with urine appearing normal in color ? ?Assessment/Plan: ? ?Principal Problem: ?  Stroke [facility]) ?Active Problems: ?  Left-sided weakness ?  AKI (acute kidney injury) ([place]) ?  OSA (obstructive sleep apnea) ? 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 No ongoing encephalopathy after baclofen dose reduction. No complaints of spasms today. Working with PT; left sided deficits persist. Remains stable for DC at this time, pending placement. ?-Continue baclofen 10 mg BID and 40 mg qHS. Adjusted time of HS dose to 1900. Watch for pseudobulbar affect as dosages increase. Patient previously d/c Baclofen 40mg  TID d/t poor emotional regulation ?- Discontinue Lyrica, as cymbalta initiated ?- ASA 81mg  daily ?- s/p 3 weeks of Clopidogrel 75mg   ?- Atorvastatin 80 mg  ?- PT/OT following  ?- Denied CIR; SW assisting with SNF placement ?- Outpatient sleep study recommended  ?- Discontinue Tele on 3/4 (will be 30 days since admission).  ? ?Adjustment Disorder ?-Patient with low mood and decreased motivation after losing independence post-stroke ?-Initiate Cymbalta 30 mg qd for depression and neuropathic pain. ?-Initiate Trazodone 50 mg qHS and x1 PRN insomnia ? ?Allergic rhinitis  ?- Flonase nasal spray  ?- Claritin added as a conjunct given delayed affect of Flonase  ?- Ocean nasal spray  ? ?AKI, resolved  ?- HCTZ discontinued  ?- Avoid nephrotoxins  ?- BMP weekly ? ?HTN ?Mostly normotensive. ?- Continue Lisinopril 40 mg and amlodipine 10 mg ?- Continue Hydralazine 10 mg TID; HR does not allow for BB ?- HCTZ discontinued given AKI after starting   ? 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 Improved  alertness and bradycardia.  ?- Continue PO synthroid 180mcg   ?- Recheck TSH and free T4 in 4-6 weeks (3/9 - 3/23)  ? ?Dry eyes ?- Artificial tears  ? ?Best Practice: ?Diet: Regular diet  ?IVF: None ?VTE: Lovenox 40 ?Code: Full ? ?Rosezetta Schlatter, MD  ?Internal Medicine/Psych Resident, PGY-1 ?[facility]  ?Pager: [phone] ?

## 2021-08-20 NOTE — Progress Notes (Signed)
Physical Therapy Treatment ?Patient Details ?Name: Jason Figueroa ?MRN: [id number] ?DOB: December 08, 1965 ?Today's Date: 08/20/2021 ? ? ?History of Present Illness 56 y.o. male presents to [facility] hospital on 07/25/2021 with numbness and tingling on left side of face along with L sided weakness. 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 PMH inlcudes HTN, previous stroke,  and recent TIA. ? ?  ?PT Comments  ? ? Continued intense work on balance and standing from lower bed height. ?   ?Recommendations for follow up therapy are one component of a multi-disciplinary discharge planning process, led by the attending physician.  Recommendations may be updated based on patient status, additional functional criteria and insurance authorization. ? ?Follow Up Recommendations ? Acute inpatient rehab (3hours/day) ?  ?  ?Assistance Recommended at Discharge Frequent or constant Supervision/Assistance  ?Patient can return home with the following A lot of help with walking and/or transfers;A little help with bathing/dressing/bathroom;Assistance with cooking/housework;Direct supervision/assist for medications management;Direct supervision/assist for financial management;Assist for transportation;Help with stairs or ramp for entrance ?  ?Equipment Recommendations ? Wheelchair (measurements PT);Wheelchair cushion (measurements PT);Hospital bed  ?  ?Recommendations for Other Services Rehab consult ? ? ?  ?Precautions / Restrictions Precautions ?Precautions: Fall ?Precaution Comments: L hemiplegia  ?  ? ?Mobility ? Bed Mobility ?Overal bed mobility: Needs Assistance ?Bed Mobility: Rolling, Sidelying to Sit ?Rolling: Min assist ?Sidelying to sit: Min assist ?  ?  ?  ?General bed mobility comments: with moderate cuing for pt to execute ?  ? ?Transfers ?Overall transfer level: Needs assistance ?  ?Transfers: Sit  to/from Stand ?Sit to Stand: Mod assist ?  ?  ?  ?  ?  ?General transfer comment: x6,  working on symmetry, forward to find the balance point prior to assist with boost. ?  ? ?Ambulation/Gait ?  ?  ?  ?  ?  ?  ?  ?General Gait Details: not able ? ? ?Stairs ?  ?  ?  ?  ?  ? ? ?Wheelchair Mobility ?  ? ?Modified Rankin (Stroke Patients Only) ?Modified Rankin (Stroke Patients Only) ?Modified Rankin: Severe disability ? ? ?  ?Balance   ?  ?Sitting balance-[name] Scale: Poor ?Sitting balance - Comments: worked on rythmic stabilization to help speed up righting reactions. ?Postural control: Left lateral lean ?  ?Standing balance-[name] Scale: Poor ?Standing balance comment: standing for balance x6 ?  ?  ?  ?  ?  ?  ?  ?  ?  ?  ?  ?  ? ?  ?Cognition Arousal/Alertness: Awake/alert ?Behavior During Therapy: Flat affect, WFL for tasks assessed/performed ?Overall Cognitive Status:  (NT formally) ?  ?  ?  ?  ?  ?  ?  ?  ?  ?  ?  ?  ?  ?  ?  ?  ?  ?  ?  ? ?  ?Exercises   ? ?  ?General Comments   ?  ?  ? ?Pertinent Vitals/Pain Pain Assessment ?Pain Assessment: Faces ?Faces Pain Scale: Hurts a little bit ?Pain Location: general ?Pain Descriptors / Indicators: Guarding ?Pain Intervention(s): Monitored during session  ? ? ?Home Living   ?  ?  ?  ?  ?  ?  ?  ?  ?  ?   ?  ?Prior Function    ?  ?  ?   ? ?PT Goals (  current goals can now be found in the care plan section) Acute Rehab PT Goals ?Patient Stated Goal: to regain strength and independence ?PT Goal Formulation: With patient ?Time For Goal Achievement: 08/24/21 ?Potential to Achieve Goals: Good ?Progress towards PT goals: Progressing toward goals ? ?  ?Frequency ? ? ? Min 3X/week ? ? ? ?  ?PT Plan Current plan remains appropriate  ? ? ?Co-evaluation   ?  ?  ?  ?  ? ?  ?AM-PAC PT "6 Clicks" Mobility   ?Outcome Measure ? Help needed turning from your back to your side while in a flat bed without using bedrails?: A Lot ?Help needed moving from lying on your back to sitting on the  side of a flat bed without using bedrails?: A Lot ?Help needed moving to and from a bed to a chair (including a wheelchair)?: A Lot ?Help needed standing up from a chair using your arms (e.g., wheelchair or bedside chair)?: A Lot ?Help needed to walk in hospital room?: Total ?Help needed climbing 3-5 steps with a railing? : Total ?6 Click Score: 10 ? ?  ?End of Session   ?Activity Tolerance: Patient tolerated treatment well ?Patient left: in bed;with call bell/phone within reach ?Nurse Communication: Mobility status ?PT Visit Diagnosis: Other abnormalities of gait and mobility (R26.89);Muscle weakness (generalized) (M62.81);Other symptoms and signs involving the nervous system (R29.898) ?  ? ? ?Time: [id number] ?PT Time Calculation (min) (ACUTE ONLY): 26 min ? ?Charges:  $Therapeutic Activity: 8-22 mins ?$Neuromuscular Re-education: 8-22 mins          ?          ? ?08/20/2021 ? ?Ginger Carne., PT ?Acute Rehabilitation Services ?[phone]  (pager) ?[phone]  (office) ? ? ?Jason Figueroa ?08/20/2021, 4:25 PM ? ?

## 2021-08-20 NOTE — TOC Progression Note (Signed)
Transition of Care (TOC) - Progression Note  ? ? ?Patient Details  ?Name: Jason Figueroa ?MRN: [id number] ?Date of Birth: Apr 13, 1966 ? ?Transition of Care (TOC) CM/SW Contact  ?Carley Hammed, LCSWA ?Phone Number: ?08/20/2021, 2:14 PM ? ?Clinical Narrative:    ? ?CSW spoke with [place] at [facility], they are short on rehab beds, but will review pt's referral. TOC will continue to follow for DC needs. ? ?Expected Discharge Plan: Skilled Nursing Facility ?Barriers to Discharge: Inadequate or no insurance, SNF Pending bed offer, SNF Pending payor source - [facility], SNF Pending Medicaid ? ?Expected Discharge Plan and Services ?Expected Discharge Plan: Skilled Nursing Facility ?  ?  ?Post Acute Care Choice: Skilled Nursing Facility ?Living arrangements for the past 2 months: Single Family Home ?                ?  ?  ?  ?  ?  ?  ?  ?  ?  ?  ? ? ?Social Determinants of Health (SDOH) Interventions ?  ? ?Readmission Risk Interventions ?No flowsheet data found. ? ?

## 2021-08-21 DIAGNOSIS — F4323 Adjustment disorder with mixed anxiety and depressed mood: Secondary | ICD-10-CM | POA: Diagnosis not present

## 2021-08-21 MED ORDER — TRAZODONE HCL 50 MG PO TABS
50.0000 mg | ORAL_TABLET | Freq: Every evening | ORAL | Status: DC | PRN
  Administered 2021-08-21 – 2021-09-09 (×11): 50 mg via ORAL
  Filled 2021-08-21 (×12): qty 1

## 2021-08-21 MED ORDER — BACLOFEN 10 MG PO TABS
10.0000 mg | ORAL_TABLET | Freq: Two times a day (BID) | ORAL | Status: DC
  Administered 2021-08-21 – 2021-09-11 (×44): 10 mg via ORAL
  Filled 2021-08-21 (×44): qty 1

## 2021-08-21 MED ORDER — TRAZODONE HCL 50 MG PO TABS
50.0000 mg | ORAL_TABLET | Freq: Once | ORAL | Status: AC
  Administered 2021-08-21: 50 mg via ORAL

## 2021-08-21 MED ORDER — TRAZODONE HCL 100 MG PO TABS
100.0000 mg | ORAL_TABLET | Freq: Every day | ORAL | Status: DC
  Administered 2021-08-22 – 2021-08-25 (×4): 100 mg via ORAL
  Filled 2021-08-21 (×4): qty 1

## 2021-08-21 MED ORDER — BACLOFEN 10 MG PO TABS
40.0000 mg | ORAL_TABLET | Freq: Every day | ORAL | Status: DC
  Administered 2021-08-21 – 2021-09-10 (×21): 40 mg via ORAL
  Filled 2021-08-21 (×22): qty 4

## 2021-08-21 NOTE — Progress Notes (Signed)
? ?Subjective ?Overnight events: none ? ?Pt is seen at bedside during rounds today. Reports some spasms last night, and advised that the timing of Baclofen will certainly be adjusted tonight. Has some motivation to engage in strength training on his own today. Again reported some difficulty falling asleep but slept well through the night.  ? ?Objective: ? ?Vital signs in last 24 hours: ?Vitals:  ? 08/20/21 2346 08/21/21 0423 08/21/21 0858 08/21/21 1059  ?BP: (!) 110/58 123/79 119/83 122/66  ?Pulse: (!) 56 60 (!) 50 (!) 59  ?Resp: 17 17 12 20   ?Temp: 98.1 ?F (36.7 ?C) (!) 97.5 ?F (36.4 ?C) 97.7 ?F (36.5 ?C) 97.9 ?F (36.6 ?C)  ?TempSrc: Oral  Oral Oral  ?SpO2: 96% 99% 98% 94%  ?Weight:      ?Height:      ? ?Constitutional: alert, well-appearing, in NAD  ?Cardiovascular: RRR, no m/r/g, no edema of bilateral LE ?Pulmonary/Chest: normal work of breathing on room air; CTAB anteriorly, as patient with great difficulty leaning forward. ?Abdominal: soft, non-tender to palpation, non-distended ?Neurological: A&O x 3, follows commands. Persistent, improved left facial droop and dysarthria.  Patient with persistent left upper extremity and left lower extremity 0 out of 5 strength. ?Skin: Normal skin turgor ?GU: Condom catheter in place with urine appearing normal in color ? ?Assessment/Plan: ? ?Principal Problem: ?  Stroke [facility]) ?Active Problems: ?  Left-sided weakness ?  AKI (acute kidney injury) (HCC) ?  OSA (obstructive sleep apnea) ?  Adjustment disorder with mixed anxiety and depressed mood ? 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 No ongoing encephalopathy after baclofen dose reduction. No complaints of spasms today. Working with PT; left sided deficits  persist. Remains stable for DC at this time, pending placement. ?-Continue baclofen 10 mg BID and 40 mg qHS. Adjusted time of HS dose to 1900. Watch for pseudobulbar affect as dosages increase. Patient previously d/c Baclofen 40mg  TID d/t poor emotional regulation ?- Discontinue Lyrica, as cymbalta initiated ?- ASA 81mg  daily ?- s/p 3 weeks of Clopidogrel 75mg   ?- Atorvastatin 80 mg  ?- PT/OT following  ?- Denied CIR; SW assisting with SNF placement ?- Outpatient sleep study recommended  ?- Discontinue Tele on 3/4 (will be 30 days since admission).  ? ?Adjustment Disorder ?-Patient with low mood and decreased motivation after losing independence post-stroke ?-Continue Cymbalta 30 mg qd for depression and neuropathic pain. ?-Increase to Trazodone 100 mg qHS and 50 mg x1 PRN insomnia ? ?Allergic rhinitis  ?- Flonase nasal spray  ?- Claritin added as a conjunct given delayed affect of Flonase  ?- Ocean nasal spray  ? ?AKI, resolved  ?- HCTZ discontinued  ?- Avoid nephrotoxins  ?- BMP weekly; to be obtained tomorrow to ensure stability ? ?HTN ?Mostly normotensive. ?- Continue Lisinopril 40 mg and amlodipine 10 mg ?- Continue Hydralazine 10 mg TID; HR does not allow for BB ?- HCTZ discontinued given AKI after starting   ? 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 Improved alertness and bradycardia.  ?- Continue PO synthroid   ?- Recheck TSH and free T4 in 4-6 weeks (3/9 - 3/23)  ? ?Dry eyes ?- Artificial tears  ? ?  Best Practice: ?Diet: Regular diet  ?IVF: None ?VTE: Lovenox 40 ?Code: Full ? ?Lamar Sprinkles, MD  ?Internal Medicine/Psych Resident, PGY-1 ?[facility]  ?Pager: [phone] ?

## 2021-08-21 NOTE — Progress Notes (Signed)
?   08/21/21 0858  ?Assess: MEWS Score  ?Temp 97.7 ?F (36.5 ?C)  ?BP 119/83  ?Pulse Rate (!) 50  ?Resp 12  ?Level of Consciousness Alert  ?SpO2 98 %  ?O2 Device Room Air  ?Patient Activity (if Appropriate) In bed  ?Assess: MEWS Score  ?MEWS Temp 0  ?MEWS Systolic 0  ?MEWS Pulse 1  ?MEWS RR 1  ?MEWS LOC 0  ?MEWS Score 2  ?MEWS Score Color Yellow  ?Assess: if the MEWS score is Yellow or Red  ?Were vital signs taken at a resting state? Yes  ?Focused Assessment No change from prior assessment  ?Early Detection of Sepsis Score *See Row Information* Low  ?MEWS guidelines implemented *See Row Information* No, other (Comment) ?(patient has been SB chronically, no change in condition)  ?Treat  ?MEWS Interventions Other (Comment) ?(patient assessed and MD notified)  ?Pain Scale 0-10  ?Pain Score 0  ?Notify: Charge Nurse/RN  ?Name of Charge Nurse/RN Notified Marylene Land RN  ?Date Charge Nurse/RN Notified 08/21/21  ?Time Charge Nurse/RN Notified 1045  ?Notify: Provider  ?Provider Name/Title Lamar Sprinkles MD  ?Date Provider Notified 08/21/21  ?Time Provider Notified 1043  ?Notification Type Call  ?Notification Reason Other (Comment) ?(yellow MEWS)  ?Provider response No new orders  ?Date of Provider Response 08/21/21  ?Time of Provider Response 1043  ?Notify: Rapid Response  ?Name of Rapid Response RN Notified Not applicable  ?Document  ?Patient Outcome Other (Comment) ?(Patient has been stable and asymptomatic with HR mostly in the 50s.)  ?Progress note created (see row info) Yes  ? ? ?

## 2021-08-22 LAB — BASIC METABOLIC PANEL
Anion gap: 10 (ref 5–15)
BUN: 39 mg/dL — ABNORMAL HIGH (ref 6–20)
CO2: 25 mmol/L (ref 22–32)
Calcium: 9.3 mg/dL (ref 8.9–10.3)
Chloride: 106 mmol/L (ref 98–111)
Creatinine, Ser: 1.18 mg/dL (ref 0.61–1.24)
GFR, Estimated: >60 mL/min (ref >60)
Glucose, Bld: 160 mg/dL — ABNORMAL HIGH (ref 70–99)
Potassium: 4 mmol/L (ref 3.5–5.1)
Sodium: 141 mmol/L (ref 135–145)

## 2021-08-22 MED ORDER — ALUM & MAG HYDROXIDE-SIMETH 200-200-20 MG/5ML PO SUSP
30.0000 mL | ORAL | Status: DC | PRN
  Administered 2021-08-22 – 2021-09-09 (×12): 30 mL via ORAL
  Filled 2021-08-22 (×12): qty 30

## 2021-08-22 MED ORDER — SENNOSIDES-DOCUSATE SODIUM 8.6-50 MG PO TABS
1.0000 | ORAL_TABLET | Freq: Two times a day (BID) | ORAL | Status: DC
  Administered 2021-08-22 – 2021-08-23 (×3): 1 via ORAL
  Filled 2021-08-22 (×3): qty 1

## 2021-08-22 NOTE — Progress Notes (Signed)
PT Cancellation Note ? ?Patient Details ?Name: Jason Figueroa ?MRN: [id number] ?DOB: Dec 13, 1965 ? ? ?Cancelled Treatment:    Reason Eval/Treat Not Completed: (P) Patient declined, no reason specified (reports he had a bad night with his stomach.  Provided education and encouragement and he continues to decline.) ? ? ?Damel Querry Artis Delay ?08/22/2021, 1:43 PM ? ?Adelynn Gipe R. , PTA ?Acute Rehabilitation Services ?Pager [phone] ?Office [phone] ? ?

## 2021-08-22 NOTE — Progress Notes (Signed)
? ?  Subjective ?Overnight events: None ? ?Evaluated patient at bedside. States that he slept great last night for the first time in a while.  ? ?Notes that he is having some indigestion this morning. No nausea or vomiting at this time. States that he did not have a BM yesterday and thinks that is why. ? ? ?Objective: ? ?Vital signs in last 24 hours: ?Vitals:  ? 08/21/21 1952 08/21/21 2314 08/22/21 0425 08/22/21 0713  ?BP: 122/67 118/64 105/71 (!) 114/57  ?Pulse: 68 64 (!) 59 (!) 58  ?Resp: 20 18 14 20   ?Temp: 98.7 ?F (37.1 ?C) 97.9 ?F (36.6 ?C) (!) 97.5 ?F (36.4 ?C) 98.3 ?F (36.8 ?C)  ?TempSrc: Oral Oral Oral Oral  ?SpO2: 97% 97% 98% 97%  ?Weight:      ?Height:      ? ?General: alert, well-appearing, NAD. ?CV: normal rate and regular rhythm, no m/r/g. ?Pulm: normal work of breathing b/l, no respiratory distress. ?Abdomen: soft, nondistended. Discomfort with palpation in upper abdomen but no tenderness. Hypoactive bowel sounds. No rebound of guarding noted.  ?Neuro: AAOx3. Persistent L facial droop and mild dysarthria. Persistent LUE and LLE 0/5 strength.  ?GU: condom catheter in place with light yellow colored urine. ? ? ?Assessment/Plan: ? ?Principal Problem: ?  Stroke [facility]) ?Active Problems: ?  Left-sided weakness ?  AKI (acute kidney injury) (HCC) ?  OSA (obstructive sleep apnea) ?  Adjustment disorder with mixed anxiety and depressed mood ? 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 No ongoing encephalopathy after baclofen dose reduction. No complaints of spasms today. Working with PT; left sided deficits persist. Remains stable for DC at this time, pending placement. ?-Continue baclofen 10 mg BID and 40 mg qHS. Watch for pseudobulbar affect as  dosages increase. Patient previously d/c Baclofen 40mg  TID d/t poor emotional regulation ?- continue cymbalta ?- ASA 81mg  daily, finished course of plavix ?- continue lipitor ?- PT/OT following  ?- Denied CIR; SW assisting with SNF placement ?- Outpatient sleep study recommended  ?- discontinued telemetry ? ?Indigestion ?Constipation ?C/o indigestion this morning in the setting of constipation. Will optimize bowel regimen. ?-maalox/mylanta ?-miralax daily ?-scheduled senna-s 1 tablet BID ? ?Adjustment Disorder ?-Patient with low mood and decreased motivation after losing independence post-stroke ?-Continue Cymbalta 30 mg qd for depression and neuropathic pain. ?-Continue trazodone 100mg  qhs along with 50mg  x1 prn for insomnia (notes improved sleep with this regimen) ? ?AKI, resolved  ?- HCTZ discontinued  ?- Avoid nephrotoxins  ?- BMP unremarkable, kidney function stable ? ?HTN ?Normotensive since yesterday. ?- Continue Lisinopril 40 mg and amlodipine 10 mg ?- Continue Hydralazine 10 mg TID; HR does not allow for BB ?- HCTZ discontinued given AKI after starting   ? 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 Doing well, mental status at baseline. ?- Continue PO synthroid Pearlean Brownie   ?- Recheck TSH and free T4 in 4-6 weeks (3/9 - 3/23)  ? ? ?Best Practice: ?Diet: Regular diet  ?IVF: None ?VTE: Lovenox 40 ?Code: Full ? ? , MD ?[facility] Resident, PGY-2 ?Pager: ?08/22/2021, 10:38 AM ? ?

## 2021-08-22 NOTE — Plan of Care (Signed)
Patient ID: Jason Figueroa, male   DOB: [birth year], 56 y.o.   MRN: [id number] ? ?Problem: Education: ?Goal: Knowledge of disease or condition will improve ?Outcome: Progressing ?Goal: Knowledge of secondary prevention will improve (SELECT ALL) ?Outcome: Progressing ?Goal: Knowledge of patient specific risk factors will improve (INDIVIDUALIZE FOR PATIENT) ?Outcome: Progressing ?Goal: Individualized Educational Video(s) ?Outcome: Progressing ?  ?Problem: Coping: ?Goal: Will verbalize positive feelings about self ?Outcome: Progressing ?Goal: Will identify appropriate support needs ?Outcome: Progressing ?  ?Problem: Health Behavior/Discharge Planning: ?Goal: Ability to manage health-related needs will improve ?Outcome: Progressing ?  ?Problem: Self-Care: ?Goal: Ability to participate in self-care as condition permits will improve ?Outcome: Progressing ?Goal: Verbalization of feelings and concerns over difficulty with self-care will improve ?Outcome: Progressing ?Goal: Ability to communicate needs accurately will improve ?Outcome: Progressing ?  ?Problem: Nutrition: ?Goal: Risk of aspiration will decrease ?Outcome: Progressing ?  ?Problem: Ischemic Stroke/TIA Tissue Perfusion: ?Goal: Complications of ischemic stroke/TIA will be minimized ?Outcome: Progressing ?  ?Problem: Education: ?Goal: Knowledge of General Education information will improve ?Description: Including pain rating scale, medication(s)/side effects and non-pharmacologic comfort measures ?Outcome: Progressing ?  ?Problem: Health Behavior/Discharge Planning: ?Goal: Ability to manage health-related needs will improve ?Outcome: Progressing ?  ?Problem: Clinical Measurements: ?Goal: Ability to maintain clinical measurements within normal limits will improve ?Outcome: Progressing ?Goal: Will remain free from infection ?Outcome: Progressing ?Goal: Diagnostic test results will improve ?Outcome: Progressing ?Goal: Respiratory complications will improve ?Outcome:  Progressing ?Goal: Cardiovascular complication will be avoided ?Outcome: Progressing ?  ?Problem: Activity: ?Goal: Risk for activity intolerance will decrease ?Outcome: Progressing ?  ?Problem: Coping: ?Goal: Level of anxiety will decrease ?Outcome: Progressing ?  ?Problem: Elimination: ?Goal: Will not experience complications related to bowel motility ?Outcome: Progressing ?Goal: Will not experience complications related to urinary retention ?Outcome: Progressing ?  ?Problem: Pain Managment: ?Goal: General experience of comfort will improve ?Outcome: Progressing ?  ?Problem: Safety: ?Goal: Ability to remain free from injury will improve ?Outcome: Progressing ?  ?Problem: Skin Integrity: ?Goal: Risk for impaired skin integrity will decrease ?Outcome: Progressing ?  ?Lidia Collum, RN ? ?

## 2021-08-23 MED ORDER — SENNOSIDES-DOCUSATE SODIUM 8.6-50 MG PO TABS
2.0000 | ORAL_TABLET | Freq: Two times a day (BID) | ORAL | Status: DC
  Administered 2021-08-23 – 2021-09-10 (×37): 2 via ORAL
  Filled 2021-08-23 (×38): qty 2

## 2021-08-23 NOTE — Progress Notes (Signed)
? ?  Subjective ?Overnight events: None ? ?Sleeping comfortably when I arrived. Doing well, only complaint is not having had a BM yesterday (last one 3 days ago). Indigestion improved. ? ? ?Objective: ? ?Vital signs in last 24 hours: ?Vitals:  ? 08/23/21 0034 08/23/21 0421 08/23/21 0703 08/23/21 0932  ?BP: 110/66 113/75 100/70 123/74  ?Pulse: (!) 57 (!) 55 (!) 53   ?Resp: 16 16 16    ?Temp: 97.8 ?F (36.6 ?C) 97.8 ?F (36.6 ?C) (!) 97.5 ?F (36.4 ?C)   ?TempSrc:  Oral    ?SpO2: 96% 97% 98%   ?Weight:      ?Height:      ? ?General: well-appearing middle aged male, lying in bed, NAD. ?CV: normal rate and regular rhythm, no m/r/g. ?Pulm: Normal work of breathing on room air, no respiratory distress noted. ?Abdomen: Soft, nondistended, nontender, hypoactive bowel sounds. ?Neuro: AAOx3, persistent left facial droop and mild dysarthria.  Persistent left upper and lower extremity 0 out of 5 strength. ?Psych: Normal mood and affect ? ?Assessment/Plan: ? ?Principal Problem: ?  Stroke [facility] Figu[facility]) ?Active Problems: ?  Left-sided weakness ?  AKI (acute kidney injury) (HCC) ?  OSA (obstructive sleep apnea) ?  Adjustment disorder with mixed anxiety and depressed mood ? 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 No ongoing encephalopathy after baclofen dose reduction. Working with PT; left sided deficits persist. Remains stable for DC at this time, pending SNF placement. ?-Continue baclofen 10 mg BID and 40 mg qHS. Watch for pseudobulbar affect as dosages increase. Patient previously d/c Baclofen 40mg  TID d/t poor emotional regulation ?- continue cymbalta ?- ASA 81mg  daily, finished course of plavix ?- continue lipitor ?- PT/OT following  ?- Denied CIR; SW assisting with SNF  placement ?- Outpatient sleep study recommended  ? ?Indigestion -resolved ?Constipation ?Continues to have constipation.  Will increase bowel regimen. ?-maalox/mylanta ?-miralax daily ?-scheduled senna-s increased to 2 tablets twice daily ?-Consider Dulcolax suppository if remains constipated ? ?Adjustment Disorder ?-Patient with low mood and decreased motivation after losing independence post-stroke ?-Continue Cymbalta 30 mg qd for depression and neuropathic pain. ?-Continue trazodone 100mg  qhs along with 50mg  x1 prn for insomnia (notes improved sleep with this regimen) ? ?AKI, resolved  ?- HCTZ discontinued  ?- Avoid nephrotoxins  ?-Kidney function stable at last check ?-Weekly BMP ? ?HTN ?Normotensive since yesterday. ?- Continue Lisinopril 40 mg and amlodipine 10 mg ?- Continue Hydralazine 10 mg TID ?- HCTZ discontinued given AKI after starting   ? 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 Doing well, mental status at baseline. ?- Continue PO synthroid Jason Figueroa   ?- Recheck TSH and free T4 in 4-6 weeks (3/9 - 3/23)  ? ? ?Best Practice: ?Diet: Regular diet  ?IVF: None ?VTE: Lovenox 40 ?Code: Full ? ? , MD ?[facility] Resident, PGY-2 ?Pager: ?08/23/2021, 10:41 AM ?

## 2021-08-24 NOTE — Progress Notes (Signed)
? ?  Subjective ?Overnight events: None ? ?Patient assessed at the bedside. He is doing well overall, but still has not had a BM. Sleeping well, and his spasms improve when he sleeps.  ? ? ?Objective: ? ?Vital signs in last 24 hours: ?Vitals:  ? 08/23/21 1530 08/23/21 2014 08/23/21 2356 08/24/21 0345  ?BP: 128/64 132/61 102/71 135/62  ?Pulse: 65 66 63 60  ?Resp: 18 16 19 18   ?Temp: 97.8 ?F (36.6 ?C) 98.6 ?F (37 ?C) 98.7 ?F (37.1 ?C) 97.8 ?F (36.6 ?C)  ?TempSrc: Oral Oral Oral Axillary  ?SpO2: 97% 94% 95% 97%  ?Weight:      ?Height:      ? ?General: well-appearing middle aged male, lying in bed, NAD. ?CV: normal rate and regular rhythm, no m/r/g. ?Pulm: Normal work of breathing on room air, no respiratory distress noted. ?Abdomen: Soft, nondistended, nontender, hypoactive bowel sounds in lower quadrants, normoactive bowel sounds in upper quadrants ?Neuro: AAOx3, persistent but largely improved left facial droop and mild dysarthria.  Persistent left upper and lower extremity 0 out of 5 strength. ?Psych: Normal mood and affect ? ?Assessment/Plan: ? ?Principal Problem: ?  Stroke [facility]) ?Active Problems: ?  Left-sided weakness ?  AKI (acute kidney injury) ([place]) ?  OSA (obstructive sleep apnea) ?  Adjustment disorder with mixed anxiety and depressed mood ? 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 No ongoing encephalopathy after baclofen dose reduction. Working with PT; left sided deficits persist. Remains stable for DC at this time, pending SNF placement. ?-Continue baclofen 10 mg BID and 40 mg qHS. Watch for pseudobulbar affect as dosages increase. Patient previously d/c Baclofen 40mg  TID d/t poor emotional regulation ?- continue cymbalta ?- ASA 81mg  daily,  finished course of plavix ?- continue lipitor ?- PT/OT following  ?- Denied CIR; SW assisting with SNF placement ?- Outpatient sleep study recommended  ? ?Indigestion, resolved ?Constipation ?Continues to have constipation.  Will increase bowel regimen. ?-maalox/mylanta ?-miralax daily ?-scheduled senna-s increased to 2 tablets twice daily ?-Consider Dulcolax suppository if remains constipated ? ?Adjustment Disorder ?-Patient with low mood and decreased motivation after losing independence post-stroke ?-Continue Cymbalta 30 mg qd for depression and neuropathic pain. ?-Continue trazodone 100mg  qhs along with 50mg  x1 prn for insomnia (notes improved sleep with this regimen) ? ?AKI, resolved  ?- HCTZ discontinued  ?- Avoid nephrotoxins  ?-Kidney function stable 3/4 ?-Weekly BMP ? ?HTN ?Normotensive since yesterday. ?- Continue Lisinopril 40 mg and amlodipine 10 mg ?- Continue Hydralazine 10 mg TID ?- HCTZ discontinued given AKI after starting   ? 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 Doing well, mental status at baseline. ?- Continue PO synthroid 166mcg   ?- Recheck TSH and free T4 in 4-6 weeks (3/9 - 3/23)  ? ? ?Best Practice: ?Diet: Regular diet  ?IVF: None ?VTE: Lovenox 40 ?Code: Full ? ?Rosezetta Schlatter, MD ?Internal Medicine/Psych Intern ?PGY-1 ?Pager: [phone] ?08/24/2021, 6:02 AM ?

## 2021-08-24 NOTE — Progress Notes (Signed)
Occupational Therapy Treatment Patient Details Name: Jason Figueroa MRN: [id number] DOB: July 27, 1965 Today's Date: 08/24/2021   History of present illness 56 y.o. male presents [facility] hospital on 07/25/2021 with numbness and tingling on left side of face along with L sided weakness. 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 PMH inlcudes HTN, previous stroke,  and recent TIA.   OT comments  Jason Figueroa is making functional progress with improved assist levels for sitting and standing balance and ADLs. He was able to bathe is upper body sitting EOB with min guard and good self correction after mild LOB. He recalled strategies for safe standing and demonstrated great ability to stand with min A. Once standing he is still limited and required mod A for LB tasks or any dynamic movement. Pt continue to benefit from OT acutely, d/c remains appropriate.    Recommendations for follow up therapy are one component of a multi-disciplinary discharge planning process, led by the attending physician.  Recommendations may be updated based on patient status, additional functional criteria and insurance authorization.    Follow Up Recommendations  Skilled nursing-short term rehab (<3 hours/day)    Assistance Recommended at Discharge Frequent or constant Supervision/Assistance  Patient can return home with the following  A lot of help with walking and/or transfers;A lot of help with bathing/dressing/bathroom;Assist for transportation;Direct supervision/assist for financial management;Direct supervision/assist for medications management;Assistance with cooking/housework;Help with stairs or ramp for entrance   Equipment Recommendations  BSC/3in1;Wheelchair cushion (measurements OT);Wheelchair (measurements OT);Tub/shower bench    Recommendations for Other Services Rehab consult     Precautions / Restrictions Precautions Precautions: Fall Precaution Comments: L hemiplegia Restrictions Weight Bearing Restrictions: No       Mobility Bed Mobility Overal bed mobility: Needs Assistance Bed Mobility: Rolling, Sidelying to Sit Rolling: Mod assist Sidelying to sit: Mod assist, HOB elevated   Sit to supine: Mod assist   General bed mobility comments: with moderate cuing for pt to execute, including using RLE to assist LLE    Transfers Overall transfer level: Needs assistance   Transfers: Sit to/from Stand Sit to Stand: Min assist           General transfer comment: x3 cues for technique, balanace and safety     Balance Overall balance assessment: Needs assistance Sitting-balance support: Feet supported, Single extremity supported, No upper extremity supported Sitting balance-[name] Scale: Poor Sitting balance - Comments: still has periods of LOB to his left when focused on other tasks or thoughts Postural control: Left lateral lean Standing balance support: Single extremity supported, During functional activity, No upper extremity supported Standing balance-[name] Scale: Poor Standing balance comment: stood for ~8 minutes                           ADL either performed or assessed with clinical judgement   ADL Overall ADL's : Needs assistance/impaired         Upper Body Bathing: Min guard;Sitting Upper Body Bathing Details (indicate cue type and reason): close min guard, some LOB but able to self correct Lower Body Bathing: Moderate assistance;Sit to/from stand;Cueing for safety;Cueing for sequencing;Cueing for compensatory techniques Lower Body Bathing Details (indicate cue type and reason): bathed peri area in standing with LLE blocked and mod A fro balance Upper Body Dressing : Minimal assistance;Sitting Upper Body Dressing Details (indicate cue type and reason): cues  for hemi arm in first                 Functional mobility  during ADLs: Moderate assistance;Minimal assistance General ADL Comments: total body bathing this session while sitting EOB and standing at EOB, followed cues well    Extremity/Trunk Assessment Upper Extremity Assessment Upper Extremity Assessment: LUE deficits/detail LUE Deficits / Details: hemiplegic, hemiparesis. no trace activition noted LUE Sensation: decreased light touch;decreased proprioception LUE Coordination: decreased fine motor;decreased gross motor   Lower Extremity Assessment Lower Extremity Assessment: Defer to PT evaluation        Vision   Vision Assessment?: No apparent visual deficits   Perception Perception Perception: Impaired   Praxis Praxis Praxis: Not tested    Cognition Arousal/Alertness: Awake/alert Behavior During Therapy: WFL for tasks assessed/performed Overall Cognitive Status: Impaired/Different from baseline Area of Impairment: Awareness                           Awareness: Emergent   General Comments: concerned lack of progress per his expectations, "i'm losing motivation"        Exercises Other Exercises Other Exercises: PROM of hand wrist and elbow Other Exercises: sat EOb fro midline balance, unsupported for ~5 minutes    Shoulder Instructions       General Comments motivated to participate with therapy, states "I enjoy workign hard with you guys." very appreciative of efforts    Pertinent Vitals/ Pain       Pain Assessment Pain Assessment: Faces Faces Pain Scale: Hurts a little bit Pain Location: L hand with PROM Pain Descriptors / Indicators: Guarding, Discomfort Pain Intervention(s): Limited activity within patient's tolerance, Monitored during session   Frequency  Min 2X/week        Progress Toward Goals  OT Goals(current goals can now be found in the care plan section)  Progress towards OT goals: Progressing toward goals  Acute Rehab OT Goals Patient Stated Goal: get better OT Goal Formulation:  With patient Time For Goal Achievement: 08/27/21 Potential to Achieve Goals: Good ADL Goals Pt Will Perform Grooming: with supervision;sitting Pt Will Perform Upper Body Dressing: with set-up;sitting Pt Will Perform Lower Body Dressing: with min guard assist;sit to/from stand;sitting/lateral leans Pt Will Transfer to Toilet: with min guard assist;bedside commode;squat pivot transfer Additional ADL Goal #1: PT will complete bed mobility with S in preparation for ADL tasks Additional ADL Goal #2: Pt will independently complete self ROM  to maintain LUE [facility] for ADL tasks Additional ADL Goal #3: Pt will tolerate L resting hadn splint during night time hours without complications to protect L hand from injust and reduce risk of contracture development  Plan Discharge plan remains appropriate;Frequency remains appropriate       AM-PAC OT "6 Clicks" Daily Activity     Outcome Measure   Help from another person eating meals?: A Little Help from another person taking care of personal grooming?: A Little Help from another person toileting, which includes using toliet, bedpan, or urinal?: A Lot Help from another person bathing (including washing, rinsing, drying)?: A Lot Help from another person to put on and taking off regular upper body clothing?: A Lot Help from another person to put on and taking off regular lower body clothing?: A Lot 6 Click Score: 14    End of Session Equipment Utilized During Treatment: Gait belt  OT Visit Diagnosis: Unsteadiness on feet (R26.81);Other abnormalities of gait and mobility (R26.89);Muscle weakness (generalized) (M62.81);Pain;Hemiplegia and hemiparesis;Feeding  difficulties (R63.3) Hemiplegia - Right/Left: Left Hemiplegia - dominant/non-dominant: Non-Dominant Hemiplegia - 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

## 2021-08-24 NOTE — Progress Notes (Signed)
Physical Therapy Treatment ?Patient Details ?Name: Jason Figueroa ?MRN: [id number] ?DOB: [birth year] ?Today's Date: 08/24/2021 ? ? ?History of Present Illness 56 y.o. male presents to [facility] hospital on 07/25/2021 with numbness and tingling on left side of face along with L sided weakness. 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 PMH inlcudes HTN, previous stroke,  and recent TIA. ? ?  ?PT Comments  ? ? Patient very motivated. Continues with decr awareness in sitting (loses balance to his left if focusing on other tasks). Noted some hip extension elicited in supine and standing on LLE. No quad activation noted throughout session despite multiple attempts to elicit.  ?   ?Recommendations for follow up therapy are one component of a multi-disciplinary discharge planning process, led by the attending physician.  Recommendations may be updated based on patient status, additional functional criteria and insurance authorization. ? ?Follow Up Recommendations ? Skilled nursing-short term rehab (<3 hours/day) (due to lack of discharge plan and ?insurance, has been denied by [facility]) ?  ?  ?Assistance Recommended at Discharge Frequent or constant Supervision/Assistance  ?Patient can return home with the following A lot of help with walking and/or transfers;A little help with bathing/dressing/bathroom;Assistance with cooking/housework;Direct supervision/assist for medications management;Direct supervision/assist for financial management;Assist for transportation;Help with stairs or ramp for entrance ?  ?Equipment Recommendations ? Wheelchair (measurements PT);Wheelchair cushion (measurements PT);Hospital bed  ?  ?Recommendations for Other Services   ? ? ?  ?Precautions / Restrictions Precautions ?Precautions: Fall ?Precaution Comments: L hemiplegia ?Restrictions ?Weight Bearing Restrictions: No  ?   ? ?Mobility ? Bed Mobility ?Overal bed mobility: Needs Assistance ?Bed Mobility: Rolling, Sidelying to Sit ?Rolling: Mod assist (to his left) ?Sidelying to sit: Mod assist, HOB elevated (up from rt side) ?  ?Sit to supine: Mod assist ?  ?General bed mobility comments: with moderate cuing for pt to execute, including using RLE to assist LLE ?  ? ?Transfers ?Overall transfer level: Needs assistance ?  ?Transfers: Sit to/from Stand ?Sit to Stand: Mod assist ?  ?  ?  ?  ?  ?General transfer comment: x6,  working on symmetry, forward to find the balance point prior to assist with boost. Once standing working on bringing left hip forward and into extension with rt should back ?  ? ?Ambulation/Gait ?Ambulation/Gait assistance:  (deferred 2/2 high falls risk) ?  ?  ?  ?  ?  ?  ?General Gait Details: not able ? ? ?Stairs ?  ?  ?  ?  ?  ? ? ?Wheelchair Mobility ?  ? ?Modified Rankin (Stroke Patients Only) ?Modified Rankin (Stroke Patients Only) ?Pre-Morbid Rankin Score: No symptoms ?Modified Rankin: Severe disability ? ? ?  ?Balance Overall balance assessment: Needs assistance ?Sitting-balance support: Feet supported, Single extremity supported, No upper extremity supported ?Sitting balance-[name] Scale: Poor ?Sitting balance - Comments: still has periods of LOB to his left when focused on other tasks or thoughts ?Postural control: Left lateral lean ?Standing balance support: Single extremity supported, During functional activity, No upper extremity supported ?Standing balance-[name] Scale: Poor ?Standing balance comment: standing for balance x6 ?  ?  ?  ?  ?  ?  ?  ?  ?  ?  ?  ?  ? ?  ?Cognition Arousal/Alertness: Awake/alert ?Behavior During Therapy: Flat affect, WFL for tasks assessed/performed ?Overall Cognitive Status:  (NT formally) ?Area of Impairment: Awareness ?  ?  ?  ?  ?  ?  ?  ?  ?  ?  ?  ?  ?  ?  Awareness: Emergent ?  ?General Comments: concerned lack of progress per his expectations ?  ?  ? ?  ?Exercises General  Exercises - Lower Extremity ?Ankle Circles/Pumps: PROM, Left, 5 reps ?Long Arc Quad: AAROM, Left, 5 reps (?hip flexion with attempts to extend knee) ?Heel Slides: AAROM, Left, 5 reps, Supine, Strengthening (wtih resisted extension) ?Other Exercises ?Other Exercises: prior to EOB worked on left hip internal rotation as becoming tight from supine position. Could benefit from resting foot splint ? ?  ?General Comments General comments (skin integrity, edema, etc.): Very motivated. Has multiple items he has been using to try to work his left side (massage gun, theraband tubing) ?  ?  ? ?Pertinent Vitals/Pain Pain Assessment ?Pain Assessment: No/denies pain  ? ? ?Home Living Family/patient expects to be discharged to:: Private residence ?Living Arrangements: Parent ?Available Help at Discharge: Family;Available PRN/intermittently ?Type of Home: Mobile home ?Home Access: Ramped entrance ?  ?  ?  ?Home Layout: One level ?Home Equipment: Conservation officer, nature (2 wheels) ?Additional Comments: pt moved back to [place] to assist his mother, she unable to assist him  ?  ?Prior Function    ?  ?  ?   ? ?PT Goals (current goals can now be found in the care plan section) Acute Rehab PT Goals ?Patient Stated Goal: to regain strength and independence ?PT Goal Formulation: With patient ?Time For Goal Achievement: 09/07/21 ?Potential to Achieve Goals: Good ?Progress towards PT goals: Progressing toward goals (goals updated based on timeframe) ? ?  ?Frequency ? ? ? Min 3X/week ? ? ? ?  ?PT Plan Discharge plan needs to be updated  ? ? ?Co-evaluation   ?  ?  ?  ?  ? ?  ?AM-PAC PT "6 Clicks" Mobility   ?Outcome Measure ? Help needed turning from your back to your side while in a flat bed without using bedrails?: A Lot ?Help needed moving from lying on your back to sitting on the side of a flat bed without using bedrails?: A Lot ?Help needed moving to and from a bed to a chair (including a wheelchair)?: A Lot ?Help needed standing up from a chair using  your arms (e.g., wheelchair or bedside chair)?: A Lot ?Help needed to walk in hospital room?: Total ?Help needed climbing 3-5 steps with a railing? : Total ?6 Click Score: 10 ? ?  ?End of Session Equipment Utilized During Treatment: Gait belt ?Activity Tolerance: Patient tolerated treatment well ?Patient left: in bed;with call bell/phone within reach;with bed alarm set ?  ?PT Visit Diagnosis: Other abnormalities of gait and mobility (R26.89);Muscle weakness (generalized) (M62.81);Other symptoms and signs involving the nervous system (R29.898) ?  ? ? ?Time: 1031-1101 ?PT Time Calculation (min) (ACUTE ONLY): 30 min ? ?Charges:  $Neuromuscular Re-education: 23-37 mins          ?          ? ? ?Arby Barrette, PT ?Acute Rehabilitation Services  ?Pager [phone] ?Office [phone] ? ? ? ?Jeanie Cooks Tyaisha Cullom ?08/24/2021, 11:13 AM ? ?

## 2021-08-24 NOTE — Plan of Care (Signed)
?  Problem: Education: ?Goal: Knowledge of disease or condition will improve ?Outcome: Progressing ?Goal: Knowledge of secondary prevention will improve (SELECT ALL) ?Outcome: Progressing ?Goal: Knowledge of patient specific risk factors will improve (INDIVIDUALIZE FOR PATIENT) ?Outcome: Progressing ?Goal: Individualized Educational Video(s) ?Outcome: Progressing ?  ?Problem: Coping: ?Goal: Will verbalize positive feelings about self ?Outcome: Progressing ?Goal: Will identify appropriate support needs ?Outcome: Progressing ?  ?Problem: Health Behavior/Discharge Planning: ?Goal: Ability to manage health-related needs will improve ?Outcome: Progressing ?  ?Problem: Self-Care: ?Goal: Ability to participate in self-care as condition permits will improve ?Outcome: Progressing ?Goal: Verbalization of feelings and concerns over difficulty with self-care will improve ?Outcome: Progressing ?Goal: Ability to communicate needs accurately will improve ?Outcome: Progressing ?  ?Problem: Nutrition: ?Goal: Risk of aspiration will decrease ?Outcome: Progressing ?  ?Problem: Ischemic Stroke/TIA Tissue Perfusion: ?Goal: Complications of ischemic stroke/TIA will be minimized ?Outcome: Progressing ?  ?Problem: Education: ?Goal: Knowledge of General Education information will improve ?Description: Including pain rating scale, medication(s)/side effects and non-pharmacologic comfort measures ?Outcome: Progressing ?  ?Problem: Health Behavior/Discharge Planning: ?Goal: Ability to manage health-related needs will improve ?Outcome: Progressing ?  ?Problem: Clinical Measurements: ?Goal: Ability to maintain clinical measurements within normal limits will improve ?Outcome: Progressing ?Goal: Will remain free from infection ?Outcome: Progressing ?Goal: Diagnostic test results will improve ?Outcome: Progressing ?Goal: Respiratory complications will improve ?Outcome: Progressing ?Goal: Cardiovascular complication will be avoided ?Outcome:  Progressing ?  ?Problem: Activity: ?Goal: Risk for activity intolerance will decrease ?Outcome: Progressing ?  ?Problem: Coping: ?Goal: Level of anxiety will decrease ?Outcome: Progressing ?  ?Problem: Elimination: ?Goal: Will not experience complications related to bowel motility ?Outcome: Progressing ?Goal: Will not experience complications related to urinary retention ?Outcome: Progressing ?  ?Problem: Pain Managment: ?Goal: General experience of comfort will improve ?Outcome: Progressing ?  ?Problem: Safety: ?Goal: Ability to remain free from injury will improve ?Outcome: Progressing ?  ?Problem: Skin Integrity: ?Goal: Risk for impaired skin integrity will decrease ?Outcome: Progressing ?  ?

## 2021-08-24 NOTE — TOC Progression Note (Signed)
Transition of Care (TOC) - Progression Note  ? ? ?Patient Details  ?Name: Jason Figueroa ?MRN: [id number] ?Date of Birth: 1965-06-24 ? ?Transition of Care (TOC) CM/SW Contact  ?[place], LCSW ?Phone Number: ?08/24/2021, 3:40 PM ? ?Clinical Narrative:    ? ?CSW called [facility]; admissions is unavailable. CSW provides contact info to secretary for return call as well as reason for call.  ? ?Expected Discharge Plan: [place] ?Barriers to Discharge: Inadequate or no insurance, SNF Pending bed offer, SNF Pending payor source - [facility], SNF Pending Medicaid ? ?Expected Discharge Plan and Services ?Expected Discharge Plan: [place] ?  ?  ?Post Acute Care Choice: [place] ?Living arrangements for the past 2 months: [place] ?                ?  ?  ?  ?  ?  ?  ?  ?  ?  ?  ? ? ?Social Determinants of Health (SDOH) Interventions ?  ? ?Readmission Risk Interventions ?No flowsheet data found. ? ?

## 2021-08-25 NOTE — Progress Notes (Signed)
Physical Therapy Treatment ?Patient Details ?Name: Jason Figueroa ?MRN: [id number] ?DOB: 05/18/1966 ?Today's Date: 08/25/2021 ? ? ?History of Present Illness 56 y.o. male presents to [facility] hospital on 07/25/2021 with numbness and tingling on left side of face along with L sided weakness. 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 PMH inlcudes HTN, previous stroke,  and recent TIA. ? ?  ?PT Comments  ? ? Pt making slow, steady progress and remains very motivated to incr independence. Will look toward working on w/c  mobility using rt extremities.    ?Recommendations for follow up therapy are one component of a multi-disciplinary discharge planning process, led by the attending physician.  Recommendations may be updated based on patient status, additional functional criteria and insurance authorization. ? ?Follow Up Recommendations ? Skilled nursing-short term rehab (<3 hours/day) (due to lack of discharge plan and ?insurance, has been denied by [facility]) ?  ?  ?Assistance Recommended at Discharge Frequent or constant Supervision/Assistance  ?Patient can return home with the following A lot of help with walking and/or transfers;A little help with bathing/dressing/bathroom;Assistance with cooking/housework;Direct supervision/assist for medications management;Direct supervision/assist for financial management;Assist for transportation;Help with stairs or ramp for entrance ?  ?Equipment Recommendations ? Wheelchair (measurements PT);Wheelchair cushion (measurements PT);Hospital bed  ?  ?Recommendations for Other Services   ? ? ?  ?Precautions / Restrictions Precautions ?Precautions: Fall ?Precaution Comments: L hemiplegia ?Restrictions ?Weight Bearing Restrictions: No  ?  ? ?Mobility ? Bed Mobility ?Overal bed mobility: Needs Assistance ?Bed Mobility: Rolling, Sit to Supine, Sidelying to  Sit ?Rolling: Mod assist ?Sidelying to sit: Mod assist, HOB elevated (up from rt side) ?  ?Sit to supine: Mod assist ?  ?General bed mobility comments: Assist to move LUE and LLE. Assist to elevate trunk and bring hips to EOB. Assist to bring LLE back up into bed returning to supine ?  ? ?Transfers ?Overall transfer level: Needs assistance ?Equipment used:  (used back of small bedside chair) ?Transfers: Sit to/from Stand ?Sit to Stand: Mod assist ?  ?  ?  ?  ?  ?General transfer comment: Performed x 4 working on balance and activation of lt hip ?  ? ?Ambulation/Gait ?  ?  ?  ?  ?  ?  ?Pre-gait activities: Stood x 4 for 1-2 minutes working on weight shifting trying to get activation of LLE. Some activation of hip but no quad activation noted. ?  ? ? ?Stairs ?  ?  ?  ?  ?  ? ? ?Wheelchair Mobility ?  ? ?Modified Rankin (Stroke Patients Only) ?Modified Rankin (Stroke Patients Only) ?Pre-Morbid Rankin Score: No symptoms ?Modified Rankin: Severe disability ? ? ?  ?Balance Overall balance assessment: Needs assistance ?Sitting-balance support: Feet supported, Single extremity supported, No upper extremity supported ?Sitting balance-[name] Scale: Poor ?Sitting balance - Comments: Pt with loss of balance to rt or lt at times ?Postural control: Right lateral lean, Left lateral lean ?Standing balance support: Single extremity supported, During functional activity, No upper extremity supported ?Standing balance-[name] Scale: Poor ?Standing balance comment: min assist for static standing balance and single extemity support ?  ?  ?  ?  ?  ?  ?  ?  ?  ?  ?  ?  ? ?  ?Cognition Arousal/Alertness: Awake/alert ?Behavior During Therapy: [facility] for tasks assessed/performed ?Overall Cognitive Status:  (NT formally) ?Area of Impairment: Awareness ?  ?  ?  ?  ?  ?  ?  ?  ?  ?  ?  ?  ?  ?  Awareness: Emergent ?  ?  ?  ?  ? ?  ?Exercises Other Exercises ?Other Exercises: Bridging x 10 with facilitation of lt hip ?Other Exercises: Isometric hip  abduction x 10 ?Other Exercises: Isometric hip adduction x 10 ? ?  ?General Comments   ?  ?  ? ?Pertinent Vitals/Pain Pain Assessment ?Pain Assessment: No/denies pain  ? ? ?Home Living   ?  ?  ?  ?  ?  ?  ?  ?  ?  ?   ?  ?Prior Function    ?  ?  ?   ? ?PT Goals (current goals can now be found in the care plan section) Acute Rehab PT Goals ?Patient Stated Goal: to regain strength and independence ?Progress towards PT goals: Progressing toward goals ? ?  ?Frequency ? ? ? Min 3X/week ? ? ? ?  ?PT Plan Current plan remains appropriate  ? ? ?Co-evaluation   ?  ?  ?  ?  ? ?  ?AM-PAC PT "6 Clicks" Mobility   ?Outcome Measure ? Help needed turning from your back to your side while in a flat bed without using bedrails?: A Lot ?Help needed moving from lying on your back to sitting on the side of a flat bed without using bedrails?: A Lot ?Help needed moving to and from a bed to a chair (including a wheelchair)?: A Lot ?Help needed standing up from a chair using your arms (e.g., wheelchair or bedside chair)?: A Lot ?Help needed to walk in hospital room?: Total ?Help needed climbing 3-5 steps with a railing? : Total ?6 Click Score: 10 ? ?  ?End of Session Equipment Utilized During Treatment: Gait belt ?Activity Tolerance: Patient tolerated treatment well ?Patient left: in bed;with call bell/phone within reach;with bed alarm set ?Nurse Communication: Mobility status ?PT Visit Diagnosis: Other abnormalities of gait and mobility (R26.89);Muscle weakness (generalized) (M62.81);Other symptoms and signs involving the nervous system (R29.898) ?  ? ? ?Time: 0626-9485 ?PT Time Calculation (min) (ACUTE ONLY): 29 min ? ?Charges:  $Therapeutic Activity: 23-37 mins          ?          ? ?[facility] PT ?Acute Rehabilitation Services ?Pager [phone] ?Office [phone] ? ? ? ?Angelina Ok [facility] ?08/25/2021, 4:32 PM ? ?

## 2021-08-25 NOTE — TOC Progression Note (Signed)
Transition of Care (TOC) - Progression Note  ? ? ?Patient Details  ?Name: PAETON STUDER ?MRN: [id number] ?Date of Birth: [birth year] ? ?Transition of Care (TOC) CM/SW Contact  ?Tyrell Seifer Aris Lot, LCSW ?Phone Number: ?08/25/2021, 1:04 PM ? ?Clinical Narrative:    ? ?CSW called [facility] admissions; informed that referral was likely not received. Malena Peer, [facility] liaison requested CSW email referral to [email or web address]. CSW emailed SNF referral.  ? ?Expected Discharge Plan: Skilled Nursing Facility ?Barriers to Discharge: Inadequate or no insurance, SNF Pending bed offer, SNF Pending payor source - [facility], SNF Pending Medicaid ? ?Expected Discharge Plan and Services ?Expected Discharge Plan: Skilled Nursing Facility ?  ?  ?Post Acute Care Choice: Skilled Nursing Facility ?Living arrangements for the past 2 months: Single Family Home ?                ?  ?  ?  ?  ?  ?  ?  ?  ?  ?  ? ? ?Social Determinants of Health (SDOH) Interventions ?  ? ?Readmission Risk Interventions ?No flowsheet data found. ? ?

## 2021-08-25 NOTE — Progress Notes (Signed)
? ?  Subjective ?Overnight events: None ? ?Patient had a bowel movement overnight. Feeling less constipated today. Otherwise doing well. ? ? ?Objective: ? ?Vital signs in last 24 hours: ?Vitals:  ? 08/24/21 2008 08/25/21 0030 08/25/21 0336 08/25/21 [id number]  ?BP: 122/70 122/71 138/79 129/81  ?Pulse: 65 (!) 53 (!) 56 (!) 52  ?Resp: 16 (!) 22 20 16   ?Temp: 97.9 ?F (36.6 ?C) 97.8 ?F (36.6 ?C) (!) 97.5 ?F (36.4 ?C) 97.9 ?F (36.6 ?C)  ?TempSrc: Oral Oral Oral Oral  ?SpO2: 95% 96% 95% 97%  ?Weight:      ?Height:      ? ?General: well-appearing middle aged male, lying in bed, NAD. ?CV: normal rate and regular rhythm, no m/r/g. ?Pulm: Normal work of breathing on room air, no respiratory distress noted. ?Abdomen: Soft, nondistended, nontender, hypoactive bowel sounds in lower quadrants, normoactive bowel sounds in upper quadrants ?Neuro: AAOx3, persistent but largely improved left facial droop and mild dysarthria.  Persistent left upper and lower extremity 0 out of 5 strength. ?Psych: Normal mood and affect ? ?Assessment/Plan: ? ?Principal Problem: ?  Stroke [facility]) ?Active Problems: ?  Left-sided weakness ?  AKI (acute kidney injury) ([place]) ?  OSA (obstructive sleep apnea) ?  Adjustment disorder with mixed anxiety and depressed mood ? 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 No ongoing encephalopathy after baclofen dose reduction. Working with PT; left sided deficits persist. Remains stable for DC at this time, pending SNF placement. ?-Continue baclofen 10 mg BID and 40 mg qHS. Watch for pseudobulbar affect as dosages increase. Patient previously d/c Baclofen 40mg  TID d/t poor emotional regulation ?- continue cymbalta ?- ASA 81mg  daily, finished course of plavix ?-  continue lipitor ?- PT/OT following  ?- Denied CIR; SW assisting with SNF placement ?- Outpatient sleep study recommended  ? ?Indigestion, resolved ?Constipation; improved ?Stooled overnight 3/7 ?-maalox/mylanta ?-miralax daily ?-scheduled senna-s increased to 2 tablets twice daily ?-Consider Dulcolax suppository if remains constipated ? ?Adjustment Disorder ?-Patient with low mood and decreased motivation after losing independence post-stroke ?-Continue Cymbalta 30 mg qd for depression and neuropathic pain. ?-Continue trazodone 100mg  qhs along with 50mg  x1 prn for insomnia (notes improved sleep with this regimen) ? ?AKI, resolved  ?- HCTZ discontinued  ?- Avoid nephrotoxins  ?-Kidney function stable 3/4 ?-Weekly BMP ? ?HTN ?- Continue Lisinopril 40 mg and amlodipine 10 mg ?- Continue Hydralazine 10 mg TID ?- HCTZ discontinued given AKI after starting   ? 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 Doing well, mental status at baseline. ?- Continue PO synthroid 158mcg   ?- Recheck TSH and free T4 in 4-6 weeks (3/9 - 3/23)  ? ?Best Practice: ?Diet: Regular diet  ?IVF: None ?VTE: Lovenox 40 ?Code: Full ? ?Scarlett Presto, MD ?Internal Medicine ?PGY-1 ?Pager: [phone] ?08/25/2021, 3:23 PM ?

## 2021-08-26 MED ORDER — TRAZODONE HCL 100 MG PO TABS
100.0000 mg | ORAL_TABLET | Freq: Every day | ORAL | Status: DC
  Administered 2021-08-26 – 2021-08-29 (×4): 100 mg via ORAL
  Filled 2021-08-26 (×4): qty 1

## 2021-08-26 NOTE — Progress Notes (Signed)
? ?  Subjective ?Overnight events: None ? ?Patient had a bowel movement overnight. Feeling less constipated today.  ? ?Slept well overnight, but did have some spasms that began around 10pm. States that spasms have also started in arm as well. Would like to have his trazodone scheduled for 9pm so that he can go to sleep by 9:45-10pm.  ? ?Otherwise doing well. ? ? ?Objective: ? ?Vital signs in last 24 hours: ?Vitals:  ? 08/25/21 1531 08/25/21 2007 08/26/21 0013 08/26/21 0326  ?BP: 119/67 119/77 106/69 111/69  ?Pulse: 64 62 (!) 52 64  ?Resp: 18 18 16 16   ?Temp: 98.2 ?F (36.8 ?C) 97.7 ?F (36.5 ?C) 97.6 ?F (36.4 ?C) 97.7 ?F (36.5 ?C)  ?TempSrc: Oral Oral Oral Oral  ?SpO2: 98% 97% 99% 96%  ?Weight:      ?Height:      ? ?General: well-appearing middle aged male, lying in bed, clean-shaven, NAD. ?Pulm: Normal work of breathing on room air, no respiratory distress noted. ?Neuro: AAOx3, persistent but largely improved left facial droop and mild dysarthria.  Persistent left upper and lower extremity 0 out of 5 strength. ?Psych: Upbeat mood and affect ? ?Assessment/Plan: ? ?Principal Problem: ?  Stroke [facility]) ?Active Problems: ?  Left-sided weakness ?  AKI (acute kidney injury) ([place]) ?  OSA (obstructive sleep apnea) ?  Adjustment disorder with mixed anxiety and depressed mood ? 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 No ongoing encephalopathy after baclofen dose reduction. Working with PT; left sided deficits persist. Remains stable for DC at this time, pending SNF placement. ?-Continue baclofen 10 mg BID and 40 mg qHS. Watch for pseudobulbar affect as dosages increase. Patient previously d/c Baclofen 40mg  TID d/t poor emotional regulation ?- continue cymbalta ?-  ASA 81mg  daily, finished course of plavix ?- continue lipitor ?- PT/OT following  ?- Denied CIR; SW assisting with SNF placement ?- Outpatient sleep study recommended  ? ?Indigestion, resolved ?Constipation; improved ?Stooled overnight 3/7 ?-maalox/mylanta ?-miralax daily ?-scheduled senna-s 2 tablets twice daily ?-Consider Dulcolax suppository if remains constipated ? ?Adjustment Disorder ?-Patient with low mood and decreased motivation after losing independence post-stroke ?-Continue Cymbalta 30 mg qd for depression and neuropathic pain. ?-Continue trazodone 100mg  qhs along with 50mg  x1 prn for insomnia (notes improved sleep with this regimen) ? ?AKI, resolved  ?- HCTZ discontinued  ?- Avoid nephrotoxins  ?-Kidney function stable 3/4 ?-Weekly BMP ? ?HTN ?- Continue Lisinopril 40 mg and amlodipine 10 mg ?- Continue Hydralazine 10 mg TID ?- HCTZ discontinued given AKI after starting   ? 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 Doing well, mental status at baseline. ?- Continue PO synthroid 194mcg   ?- Recheck TSH and free T4 in 4-6 weeks (3/9 - 3/23)  ? ?Best Practice: ?Diet: Regular diet  ?IVF: None ?VTE: Lovenox 40 ?Code: Full ? ?Rosezetta Schlatter, MD ?Internal Medicine/Psych Intern ?PGY-1 ?Pager: [phone] ?08/26/2021, 6:11 AM ?

## 2021-08-26 NOTE — TOC Progression Note (Addendum)
Transition of Care (TOC) - Progression Note  ? ? ?Patient Details  ?Name: Jason Figueroa ?MRN: [id number] ?Date of Birth: 1965-07-02 ? ?Transition of Care (TOC) CM/SW Contact  ?[name], LCSW ?Phone Number: ?08/26/2021, 11:28 AM ? ?Clinical Narrative:    ? ?[name] is reviewing pt referral. [name] liaison, Okey Regal, needs to know dates of submission for medicaid and disability applications. [id number]. ? ?CSW emailed Wyvonna Plum with financial counseling and requested those dates.  ? ?1147: Received email back from financial counseling stating that pt's medicaid and disability applications were submitted on 08/07/21.  ? ?Spoke with Okey Regal at [facility] and provided application dates. She states she does not currently have beds but will review a referral. She inquired about a multiple month LOG; CSW explained normally LOG's would be 1 month at a time and they could request extensions. She will follow up with TOC regarding referral.  ? ?1450: CSW called [facility]; no answer and phone just continued to ring ? ? ?Expected Discharge Plan: Skilled Nursing Facility ?Barriers to Discharge: Inadequate or no insurance, SNF Pending bed offer, SNF Pending payor source - LOG, SNF Pending Medicaid ? ?Expected Discharge Plan and Services ?Expected Discharge Plan: Skilled Nursing Facility ?  ?  ?Post Acute Care Choice: Skilled Nursing Facility ?Living arrangements for the past 2 months: Single Family Home ?                ?  ?  ?  ?  ?  ?  ?  ?  ?  ?  ? ? ?Social Determinants of Health (SDOH) Interventions ?  ? ?Readmission Risk Interventions ?No flowsheet data found. ? ?

## 2021-08-27 NOTE — Progress Notes (Signed)
? ?  Subjective ?Overnight events: None ? ?Patient endorses some LLQ abdominal cramping he believes may be 2/2 gas. He has not had a BM today. ? ?Slept well overnight, but did have some spasms that began around 10pm, as he is getting his medications later than scheduled. ? ?Otherwise doing well. ? ? ?Objective: ? ?Vital signs in last 24 hours: ?Vitals:  ? 08/26/21 1501 08/26/21 2008 08/26/21 2346 08/27/21 0338  ?BP: (!) 143/69 (!) 135/54 (!) 100/48 (!) 107/54  ?Pulse: 72 65 (!) 54 (!) 58  ?Resp: 20 17 17 17   ?Temp: 98.2 ?F (36.8 ?C) 98.2 ?F (36.8 ?C) 98.2 ?F (36.8 ?C) 97.6 ?F (36.4 ?C)  ?TempSrc: Oral Oral Oral   ?SpO2: 97% 97% 97% 96%  ?Weight:      ?Height:      ? ?General: well-appearing middle aged male, lying in bed, clean-shaven, NAD. ?Pulm: Normal work of breathing on room air, no respiratory distress noted. ?GI: Soft, NTND, no TTP in any quadrants ?Neuro: AAOx3, persistent but largely improved left facial droop and mild dysarthria.  Persistent left upper and lower extremity 0 out of 5 strength. ?Psych: Upbeat mood and affect ? ?Assessment/Plan: ? ?Principal Problem: ?  Stroke [facility]) ?Active Problems: ?  Left-sided weakness ?  AKI (acute kidney injury) ([facility]) ?  OSA (obstructive sleep apnea) ?  Adjustment disorder with mixed anxiety and depressed mood ? 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 No ongoing encephalopathy after baclofen dose reduction. Working with PT; left sided deficits persist. Remains stable for DC at this time, pending SNF placement. ?-Continue baclofen 10 mg BID and 40 mg qHS. Watch for pseudobulbar affect as dosages increase. Patient previously d/c Baclofen 40mg  TID d/t poor emotional regulation ?- continue cymbalta ?- ASA 81mg   daily, finished course of plavix ?- continue lipitor ?- PT/OT following  ?- Denied CIR; SW assisting with SNF placement ?- Outpatient sleep study recommended  ? ?Indigestion, resolved ?Constipation; improved ?Stooled overnight 3/7 and 3/8 ?-maalox/mylanta ?-miralax daily ?-scheduled senna-s 2 tablets twice daily ?-Consider Dulcolax suppository if remains constipated ? ?Adjustment Disorder ?-Patient with low mood and decreased motivation after losing independence post-stroke ?-Continue Cymbalta 30 mg qd for depression and neuropathic pain. ?-Continue trazodone 100mg  qhs along with 50mg  x1 prn for insomnia (notes improved sleep with this regimen) ? ?AKI, resolved  ?- HCTZ discontinued  ?- Avoid nephrotoxins  ?-Kidney function stable 3/4 ?-Weekly BMP ? ?HTN ?- Continue Lisinopril 40 mg and amlodipine 10 mg ?- Continue Hydralazine 10 mg TID ?- HCTZ discontinued given AKI after starting   ? 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 Doing well, mental status at baseline. ?- Continue PO synthroid   ?- Recheck TSH and free T4 in 4-6 weeks (3/9 - 3/23)  ? ?Best Practice: ?Diet: Regular diet  ?IVF: None ?VTE: Lovenox 40 ?Code: Full ? ? , MD ?Internal Medicine/Psych Intern ?PGY-1 ?Pager: ?08/27/2021, 6:01 AM ?

## 2021-08-27 NOTE — Progress Notes (Signed)
Occupational Therapy Treatment ?Patient Details ?Name: TEREN ZURCHER ?MRN: [id number] ?DOB: April 08, 1966 ?Today's Date: 08/27/2021 ? ? ?History of present illness 56 y.o. male presents to [facility] hospital on 07/25/2021 with numbness and tingling on left side of face along with L sided weakness. 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 PMH inlcudes HTN, previous stroke,  and recent TIA. ?  ?OT comments ? Pt making progress with OT goals. This session focused on LUE exercises/stretches, which are listed below, and bed mobility for positioning. Pt with decreased activity tolerance this session, however agreeable to all bed level activities. OT will continue to follow acutely.   ? ?Recommendations for follow up therapy are one component of a multi-disciplinary discharge planning process, led by the attending physician.  Recommendations may be updated based on patient status, additional functional criteria and insurance authorization. ?   ?Follow Up Recommendations ? Skilled nursing-short term rehab (<3 hours/day)  ?  ?Assistance Recommended at Discharge Frequent or constant Supervision/Assistance  ?Patient can return home with the following ? A lot of help with walking and/or transfers;A lot of help with bathing/dressing/bathroom;Assist for transportation;Direct supervision/assist for financial management;Direct supervision/assist for medications management;Assistance with cooking/housework;Help with stairs or ramp for entrance ?  ?Equipment Recommendations ? BSC/3in1;Wheelchair cushion (measurements OT);Wheelchair (measurements OT);Tub/shower bench  ?  ?Recommendations for Other Services   ? ?  ?Precautions / Restrictions Precautions ?Precautions: Fall ?Precaution Comments: L hemiplegia ?Restrictions ?Weight Bearing Restrictions: No  ? ? ?  ? ?Mobility Bed Mobility ?Overal bed mobility:  Needs Assistance ?Bed Mobility: Rolling ?Rolling: Min assist, Mod assist ?  ?  ?  ?  ?General bed mobility comments: Min A to roll to the L side and mod A to roll to the R side ?  ? ?Transfers ?  ?  ?  ?  ?  ?  ?  ?  ?  ?General transfer comment: Pt refused this session due to fatigue ?  ?  ?Balance   ?  ?  ?  ?  ?  ?  ?  ?  ?  ?  ?  ?  ?  ?  ?  ?  ?  ?  ?   ? ?ADL either performed or assessed with clinical judgement  ? ?ADL Overall ADL's : Needs assistance/impaired ?Eating/Feeding: Set up ?Eating/Feeding Details (indicate cue type and reason): assist for packaging ?Grooming: Wash/dry hands;Wash/dry face;Minimal assistance;Sitting ?Grooming Details (indicate cue type and reason): Assisted with washing LUE with a washcloth ?  ?  ?  ?  ?  ?  ?  ?  ?  ?  ?  ?  ?  ?  ?  ?General ADL Comments: Session focused on repositioning and exercises ?  ? ?Extremity/Trunk Assessment   ?  ?  ?  ?  ?  ? ?Vision   ?  ?  ?Perception   ?  ?Praxis   ?  ? ?Cognition Arousal/Alertness: Awake/alert ?Behavior During Therapy: [facility] for tasks assessed/performed ?Overall Cognitive Status: No family/caregiver present to determine baseline cognitive functioning ?  ?  ?  ?  ?  ?  ?  ?  ?  ?  ?  ?  ?  ?  ?  ?  ?General Comments: Pt motivated and reporting all the exercises that he has been doing on his own. ?  ?  ?   ?Exercises Exercises:  Other exercises ?Other Exercises ?Other Exercises: Self PROM of LUE: prayer position, flexing each finger, wrist ext/flex, elbow ext/flex ?Other Exercises: edema massage of LUE ?Other Exercises: PROM of LUE to assist with relieving stiffness ?Other Exercises: PROM of LLE, knee flex/ext, ankle flex/ext, hip abduction ? ?  ?Shoulder Instructions   ? ? ?  ?General Comments VSS on RA  ? ? ?Pertinent Vitals/ Pain       Pain Assessment ?Pain Assessment: No/denies pain ? ?Home Living   ?  ?  ?  ?  ?  ?  ?  ?  ?  ?  ?  ?  ?  ?  ?  ?  ?  ?  ? ?  ?Prior Functioning/Environment    ?  ?  ?  ?   ? ?Frequency ? Min 2X/week   ? ? ? ? ?  ?Progress Toward Goals ? ?OT Goals(current goals can now be found in the care plan section) ? Progress towards OT goals: Progressing toward goals ? ?Acute Rehab OT Goals ?Patient Stated Goal: To be more independent ?OT Goal Formulation: With patient ?Time For Goal Achievement: 09/10/21 ?Potential to Achieve Goals: Good ?ADL Goals ?Pt Will Perform Grooming: with supervision;sitting ?Pt Will Perform Upper Body Dressing: with set-up;sitting ?Pt Will Perform Lower Body Dressing: with min guard assist;sit to/from stand;sitting/lateral leans ?Pt Will Transfer to Toilet: with min guard assist;bedside commode;squat pivot transfer ?Additional ADL Goal #1: PT will complete bed mobility with S in preparation for ADL tasks ?Additional ADL Goal #2: Pt will independently complete self ROM  to maintain LUE [facility] for ADL tasks ?Additional ADL Goal #3: Pt will tolerate L resting hadn splint during night time hours without complications to protect L hand from injust and reduce risk of contracture development  ?Plan Discharge plan remains appropriate;Frequency remains appropriate   ? ?Co-evaluation ? ? ?   ?  ?  ?  ?  ? ?  ?AM-PAC OT "6 Clicks" Daily Activity     ?Outcome Measure ? ? Help from another person eating meals?: A Little ?Help from another person taking care of personal grooming?: A Little ?Help from another person toileting, which includes using toliet, bedpan, or urinal?: A Lot ?Help from another person bathing (including washing, rinsing, drying)?: A Lot ?Help from another person to put on and taking off regular upper body clothing?: A Lot ?Help from another person to put on and taking off regular lower body clothing?: A Lot ?6 Click Score: 14 ? ?  ?End of Session   ? ?OT Visit Diagnosis: Unsteadiness on feet (R26.81);Other abnormalities of gait and mobility (R26.89);Muscle weakness (generalized) (M62.81);Pain;Hemiplegia and hemiparesis;Feeding difficulties (R63.3) ?Hemiplegia - Right/Left: Left ?Hemiplegia -  dominant/non-dominant: Non-Dominant ?Hemiplegia - caused by: Cerebral infarction ?Pain - Right/Left: Left ?Pain - part of body: Leg ?  ?Activity Tolerance Patient tolerated treatment well ?  ?Patient Left in bed;with bed alarm set;with call bell/phone within reach ?  ?Nurse Communication Mobility status ?  ? ?   ? ?Time: 9381-8299 ?OT Time Calculation (min): 25 min ? ?Charges: OT General Charges ?$OT Visit: 1 Visit ?OT Treatments ?$Therapeutic Activity: 8-22 mins ?$Therapeutic Exercise: 8-22 mins ? ?Torey Reinard H., OTR/L ?Acute Rehabilitation ? ?Jenee Spaugh Elane Bing Plume ?08/27/2021, 8:33 PM ?

## 2021-08-28 MED ORDER — BISACODYL 5 MG PO TBEC
10.0000 mg | DELAYED_RELEASE_TABLET | Freq: Once | ORAL | Status: AC
  Administered 2021-08-28: 10 mg via ORAL
  Filled 2021-08-28: qty 2

## 2021-08-28 MED ORDER — MUSCLE RUB 10-15 % EX CREA
TOPICAL_CREAM | CUTANEOUS | Status: DC | PRN
  Filled 2021-08-28: qty 85

## 2021-08-28 NOTE — Progress Notes (Signed)
Physical Therapy Treatment ?Patient Details ?Name: Jason Figueroa ?MRN: [id number] ?DOB: 20-Nov-1965 ?Today's Date: 08/28/2021 ? ? ?History of Present Illness 56 y.o. male presents to [facility] hospital on 07/25/2021 with numbness and tingling on left side of face along with L sided weakness. 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 PMH inlcudes HTN, previous stroke,  and recent TIA. ? ?  ?PT Comments  ? ? Pt asked to treat his L shoulder pain with ROM.  Also emphasized sitting balance for a prolonged period with and without UE assist.  Pt declined standing today. ?   ?Recommendations for follow up therapy are one component of a multi-disciplinary discharge planning process, led by the attending physician.  Recommendations may be updated based on patient status, additional functional criteria and insurance authorization. ? ?Follow Up Recommendations ? Skilled nursing-short term rehab (<3 hours/day) ?  ?  ?Assistance Recommended at Discharge Frequent or constant Supervision/Assistance  ?Patient can return home with the following A lot of help with walking and/or transfers;A little help with bathing/dressing/bathroom;Assistance with cooking/housework;Direct supervision/assist for medications management;Direct supervision/assist for financial management;Assist for transportation;Help with stairs or ramp for entrance ?  ?Equipment Recommendations ? Wheelchair (measurements PT);Wheelchair cushion (measurements PT);Hospital bed  ?  ?Recommendations for Other Services Rehab consult ? ? ?  ?Precautions / Restrictions    ?  ? ?Mobility ? Bed Mobility ?Overal bed mobility: Needs Assistance ?Bed Mobility: Rolling ?Rolling: Mod assist ?Sidelying to sit: Mod assist, HOB elevated ?  ?Sit to supine: Mod assist ?  ?General bed mobility comments: cues for best sequence up via R UE ?  ? ?Transfers ?  ?   ?  ?  ?  ?  ?  ?  ?  ?  ?  ? ?Ambulation/Gait ?  ?  ?  ?  ?  ?  ?  ?  ? ? ?Stairs ?  ?  ?  ?  ?  ? ? ?Wheelchair Mobility ?  ? ?Modified Rankin (Stroke Patients Only) ?  ? ? ?  ?Balance   ?Sitting-balance support: Single extremity supported, No upper extremity supported, Feet supported ?Sitting balance-[name] Scale: Fair ?Sitting balance - Comments: 20 min EOB with/without R UE assist.  Pt needs UE assist for balance outside BOS, but can maintain balance with small perturbation within BOS ?  ?  ?  ?  ?  ?  ?  ?  ?  ?  ?  ?  ?  ?  ?  ?  ? ?  ?Cognition Arousal/Alertness: Awake/alert ?Behavior During Therapy: [facility] for tasks assessed/performed ?Overall Cognitive Status: Within Functional Limits for tasks assessed ?  ?  ?  ?  ?  ?  ?  ?  ?  ?  ?  ?  ?  ?  ?  ?  ?  ?  ?  ? ?  ?Exercises Other Exercises ?Other Exercises: L shoulder PROM, massage anterior aspect of shoulder ? ?  ?General Comments   ?  ?  ? ?Pertinent Vitals/Pain Pain Assessment ?Pain Assessment: Faces ?Faces Pain Scale: Hurts little more ?Pain Location: L shoulder ?Pain Descriptors / Indicators: Discomfort, Sharp, Guarding, Grimacing ?Pain Intervention(s): Monitored during session  ? ? ?Home Living   ?  ?  ?  ?  ?  ?  ?  ?  ?  ?   ?  ?Prior Function    ?  ?  ?   ? ?  PT Goals (current goals can now be found in the care plan section) Acute Rehab PT Goals ?Patient Stated Goal: to regain strength and independence ?PT Goal Formulation: With patient ?Time For Goal Achievement: 09/07/21 ?Potential to Achieve Goals: Good ?Progress towards PT goals: Progressing toward goals ? ?  ?Frequency ? ? ? Min 3X/week ? ? ? ?  ?PT Plan Current plan remains appropriate  ? ? ?Co-evaluation   ?  ?  ?  ?  ? ?  ?AM-PAC PT "6 Clicks" Mobility   ?Outcome Measure ? Help needed turning from your back to your side while in a flat bed without using bedrails?: A Lot ?Help needed moving from lying on your back to sitting on the side of a flat bed without using bedrails?: A Lot ?Help needed  moving to and from a bed to a chair (including a wheelchair)?: A Lot ?Help needed standing up from a chair using your arms (e.g., wheelchair or bedside chair)?: A Lot ?Help needed to walk in hospital room?: Total ?Help needed climbing 3-5 steps with a railing? : Total ?6 Click Score: 10 ? ?  ?End of Session   ?Activity Tolerance: Patient tolerated treatment well ?Patient left: in bed;with call bell/phone within reach;with bed alarm set ?Nurse Communication: Mobility status ?PT Visit Diagnosis: Other abnormalities of gait and mobility (R26.89);Muscle weakness (generalized) (M62.81);Other symptoms and signs involving the nervous system (R29.898) ?  ? ? ?Time: [id number] ?PT Time Calculation (min) (ACUTE ONLY): 29 min ? ?Charges:  $Therapeutic Exercise: 8-22 mins ?$Therapeutic Activity: 8-22 mins          ?          ? ?08/28/2021 ? ?Ginger Carne., PT ?Acute Rehabilitation Services ?[phone]  (pager) ?[phone]  (office) ? ? ?Tessie Fass Jamariya Davidoff ?08/28/2021, 6:01 PM ? ?

## 2021-08-28 NOTE — Progress Notes (Signed)
? ?Subjective ?Overnight events: None ? ?Patient endorses some L posterior shoulder pain, resolved as NT helped him to stretch yesterday as well as L arm shooting pain that resolved when he removed his sling. He has not had a BM since 3/8. Denies fever, diaphoresis, or chills overnight or this AM. ? ?Otherwise doing well. ? ? ?Objective: ? ?Vital signs in last 24 hours: ?Vitals:  ? 08/27/21 1907 08/27/21 1919 08/27/21 2300 08/28/21 0326  ?BP: 125/70 120/65 113/78 120/69  ?Pulse:  60 (!) 56 (!) 58  ?Resp:  16 19 18   ?Temp:  98 ?F (36.7 ?C) 97.8 ?F (36.6 ?C) 97.8 ?F (36.6 ?C)  ?TempSrc:  Oral Oral Axillary  ?SpO2:  97% 94% 97%  ?Weight:      ?Height:      ? ?General: well-appearing middle aged male, lying in bed, clean-shaven, NAD. ?Pulm: Normal work of breathing on room air, no respiratory distress noted. ?GI: Soft, NTND, no TTP in any quadrants ?Neuro: AAOx3, persistent but largely improved left facial droop and mild dysarthria.  Persistent left upper and lower extremity 0 out of 5 strength. ?MSK: Some TTP along posterior shoulder in the trapezius muscle distribution. No TTP or pain elicited with palpation of L arm. ?Skin: Some redness across bilateral shoulders and chest, non-tender to palpation, blanching, no warmth.  ?Psych: Normal mood and affect ? ?Assessment/Plan: ? ?Principal Problem: ?  Stroke [facility]) ?Active Problems: ?  Left-sided weakness ?  AKI (acute kidney injury) (HCC) ?  OSA (obstructive sleep apnea) ?  Adjustment disorder with mixed anxiety and depressed mood ? 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 No ongoing encephalopathy after baclofen dose reduction. Working with PT; left sided deficits persist. Remains stable for DC at this time,  pending SNF placement. ?-Continue baclofen 10 mg BID and 40 mg qHS. Watch for pseudobulbar affect as dosages increase. Patient previously d/c Baclofen 40mg  TID d/t poor emotional regulation ?- continue cymbalta ?- ASA 81mg  daily, finished course of plavix ?- continue lipitor ?- PT/OT following  ?- Denied CIR; SW assisting with SNF placement ?- Outpatient sleep study recommended  ? ?Acute L Shoulder pain ?Acute L arm pain ?Patient reports L shoulder pain described as tightness, relieved by stretching as well as L arm pain described as shooting, relieved by removing brace.  ?-- PT/OT following, appreciate recs ?-- Ordered muscle rub cream PRN ? ?Indigestion, resolved ?Constipation; improved ?Stooled overnight 3/7 and 3/8 ?-maalox/mylanta ?-miralax daily ?-scheduled senna-s 2 tablets twice daily ?-Consider Dulcolax suppository if remains constipated ? ?Adjustment Disorder ?-Patient with low mood and decreased motivation after losing independence post-stroke ?-Continue Cymbalta 30 mg qd for depression and neuropathic pain. ?-Continue trazodone 100mg  qhs along with 50mg  x1 prn for insomnia (notes improved sleep with this regimen) ? ?AKI, resolved  ?- HCTZ discontinued  ?- Avoid nephrotoxins  ?-Kidney function stable 3/4 ?-Weekly BMP ? ?HTN ?- Continue Lisinopril 40 mg and amlodipine 10 mg ?- Continue Hydralazine 10 mg TID ?- HCTZ discontinued given AKI after starting   ? 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 Doing well, mental status at baseline. ?- Continue PO synthroid   ?- Recheck TSH and free T4 in  4-6 weeks (3/9 - 3/23). ? ?Best Practice: ?Diet: Regular diet  ?IVF: None ?VTE: Lovenox 40 ?Code: Full ? ?Lamar Sprinkles, MD ?Internal Medicine/Psych Intern ?PGY-1 ?Pager: [phone] ?08/28/2021, 6:12 AM ?

## 2021-08-29 DIAGNOSIS — E039 Hypothyroidism, unspecified: Secondary | ICD-10-CM

## 2021-08-29 LAB — BASIC METABOLIC PANEL
Anion gap: 10 (ref 5–15)
BUN: 30 mg/dL — ABNORMAL HIGH (ref 6–20)
CO2: 26 mmol/L (ref 22–32)
Calcium: 9.7 mg/dL (ref 8.9–10.3)
Chloride: 105 mmol/L (ref 98–111)
Creatinine, Ser: 1.14 mg/dL (ref 0.61–1.24)
GFR, Estimated: >60 mL/min (ref >60)
Glucose, Bld: 107 mg/dL — ABNORMAL HIGH (ref 70–99)
Potassium: 4.4 mmol/L (ref 3.5–5.1)
Sodium: 141 mmol/L (ref 135–145)

## 2021-08-29 LAB — CBC
HCT: 40.3 % (ref 39.0–52.0)
Hemoglobin: 13.3 g/dL (ref 13.0–17.0)
MCH: 31.1 pg (ref 26.0–34.0)
MCHC: 33 g/dL (ref 30.0–36.0)
MCV: 94.4 fL (ref 80.0–100.0)
Platelets: 209 10*3/uL (ref 150–400)
RBC: 4.27 MIL/uL (ref 4.22–5.81)
RDW: 13.5 % (ref 11.5–15.5)
WBC: 8.6 10*3/uL (ref 4.0–10.5)
nRBC: 0 % (ref 0.0–0.2)

## 2021-08-29 LAB — T4, FREE: Free T4: 0.64 ng/dL (ref 0.61–1.12)

## 2021-08-29 LAB — TSH: TSH: 17.111 u[IU]/mL — ABNORMAL HIGH (ref 0.350–4.500)

## 2021-08-29 MED ORDER — BISACODYL 10 MG RE SUPP
10.0000 mg | Freq: Once | RECTAL | Status: AC
  Administered 2021-08-29: 10 mg via RECTAL
  Filled 2021-08-29: qty 1

## 2021-08-29 NOTE — Progress Notes (Signed)
? ?Subjective ?Overnight events: None ? ?Patient endorses some improvement of his L posterior shoulder pain with the muscle rub cream. He still has not had a BM since 3/8 despite treatment with senna, MiraLAX, and one-time dose of Dulcolax 10 mg.  Patient also reports minimal movement of his left arm last night, which excited him. ? ?Otherwise doing well. ? ? ?Objective: ? ?Vital signs in last 24 hours: ?Vitals:  ? 08/28/21 1600 08/28/21 1934 08/28/21 2348 08/29/21 0352  ?BP: 118/64 121/68 99/69 122/70  ?Pulse: (!) 58 70 (!) 59 65  ?Resp: 18 16 16 17   ?Temp: 98.4 ?F (36.9 ?C) 98.4 ?F (36.9 ?C) 98 ?F (36.7 ?C) 97.6 ?F (36.4 ?C)  ?TempSrc: Oral Oral  Oral  ?SpO2: 100% 96% 94% 98%  ?Weight:      ?Height:      ? ?General: well-appearing middle aged male, lying in bed, NAD. ?Pulm: Normal work of breathing on room air, no respiratory distress noted. ?GI: Soft, NTND, no TTP in any quadrants ?Neuro: AAOx3, persistent but largely improved left facial droop and mild dysarthria.  Persistent left upper and lower extremity 0 out of 5 strength. ?Skin: No redness noted across bilateral shoulders and chest today. ?Psych: Normal mood and affect ? ?Assessment/Plan: ? ?Principal Problem: ?  Stroke [place]) ?Active Problems: ?  Left-sided weakness ?  AKI (acute kidney injury) ([place]) ?  OSA (obstructive sleep apnea) ?  Adjustment disorder with mixed anxiety and depressed mood ? 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 No ongoing encephalopathy after baclofen dose reduction. Working with PT; left sided deficits persist. Remains stable for DC at this time, pending SNF placement. ?-Continue baclofen 10 mg BID and 40 mg qHS. Watch for pseudobulbar affect as dosages increase.  Patient previously d/c Baclofen 40mg  TID d/t poor emotional regulation ?- continue cymbalta ?- ASA 81mg  daily, finished course of plavix ?- continue lipitor ?- PT/OT following  ?- Denied CIR; SW assisting with SNF placement ?- Outpatient sleep study recommended  ? ?Acute L Shoulder pain ?Acute L arm pain ?Patient reports L shoulder pain described as tightness, relieved by stretching as well as L arm pain described as shooting, relieved by removing brace.  ?-- PT/OT following, appreciate recs ? GivMohr sling for L arm ?-- continue muscle rub cream PRN ? ?Constipation ?Indigestion, resolved ?Stooled overnight 3/7 and 3/8 ?-maalox/mylanta ?-miralax daily ?-Continue senna-s 2 tablets twice daily ?-Ordered Dulcolax suppository since patient still without BM. ?-CBC ordered to r/o infection with constipation and softer BPs. WNL 3/11. ? ?Adjustment Disorder ?-Patient with low mood and decreased motivation after losing independence post-stroke; improvements ?-Continue Cymbalta 30 mg qd for depression and neuropathic pain. ?-Continue trazodone 100mg  qhs along with 50mg  x1 prn for insomnia (notes improved sleep with this regimen) ? ?AKI, resolved  ?- HCTZ discontinued  ?- Avoid nephrotoxins  ?-Kidney function stable 3/11 ?-Weekly BMP ? ?HTN ?- Continue Lisinopril 40 mg and amlodipine 10 mg ?- Continue Hydralazine 10 mg TID ?- HCTZ discontinued given AKI after starting   ? 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 Doing well, mental status at baseline. ?- Continue PO synthroid 115mcg   ?- Follow-up TSH 17.111 and free  T4 0.64. ? ?Best Practice: ?Diet: Regular diet  ?IVF: None ?VTE: Lovenox 40 ?Code: Full ? ?Rosezetta Schlatter, MD ?Internal Medicine/Psych Intern ?PGY-1 ?Pager: [phone] ?08/29/2021, 6:15 AM ?

## 2021-08-29 NOTE — Progress Notes (Signed)
Physical Therapy Treatment ?Patient Details ?Name: Jason Figueroa ?MRN: [id number] ?DOB: 30-May-1966 ?Today's Date: 08/29/2021 ? ? ?History of Present Illness 56 y.o. male presents to [facility] hospital on 07/25/2021 with numbness and tingling on left side of face along with L sided weakness. 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 PMH inlcudes HTN, previous stroke,  and recent TIA. ? ?  ?PT Comments  ? ? Placed GivMohr sling on patient to evaluate how well it supported pt's L UE during mobility.  Pt liked the slings support.  Emphasis on transitions, scooting, sit to stand x3 and balance activity x3. ?   ?Recommendations for follow up therapy are one component of a multi-disciplinary discharge planning process, led by the attending physician.  Recommendations may be updated based on patient status, additional functional criteria and insurance authorization. ? ?Follow Up Recommendations ? Skilled nursing-short term rehab (<3 hours/day) ?  ?  ?Assistance Recommended at Discharge Frequent or constant Supervision/Assistance  ?Patient can return home with the following A lot of help with walking and/or transfers;A little help with bathing/dressing/bathroom;Assistance with cooking/housework;Direct supervision/assist for medications management;Direct supervision/assist for financial management;Assist for transportation;Help with stairs or ramp for entrance ?  ?Equipment Recommendations ? Wheelchair (measurements PT);Wheelchair cushion (measurements PT);Hospital bed  ?  ?Recommendations for Other Services Rehab consult ? ? ?  ?Precautions / Restrictions Precautions ?Precautions: Fall ?Precaution Comments: GivMorh sling for OOB, L resting hand splint for night time use ?Restrictions ?Weight Bearing Restrictions: No  ?  ? ?Mobility ? Bed Mobility ?Overal bed mobility: Needs Assistance ?Bed  Mobility: Supine to Sit, Sit to Supine, Sit to Sidelying ?  ?Sidelying to sit: Min assist, HOB elevated ?  ?  ?Sit to sidelying: Mod assist ?General bed mobility comments: cues for sequence and technique, assist to hook LLE and trunk control ?  ? ?Transfers ?Overall transfer level: Needs assistance ?Equipment used: 1 person hand held assist ?Transfers: Sit to/from Stand ?Sit to Stand: Min assist, Mod assist (x3) ?  ?  ?  ?  ?  ?General transfer comment: fluctuated from min-mod during transfers. cues for technique ?  ? ?Ambulation/Gait ?  ?  ?  ?  ?  ?  ?  ?General Gait Details: not able ? ? ?Stairs ?  ?  ?  ?  ?  ? ? ?Wheelchair Mobility ?  ? ?Modified Rankin (Stroke Patients Only) ?Modified Rankin (Stroke Patients Only) ?Pre-Morbid Rankin Score: No symptoms ?Modified Rankin: Severe disability ? ? ?  ?Balance Overall balance assessment: Needs assistance ?Sitting-balance support: Single extremity supported, No upper extremity supported, Feet supported ?Sitting balance-[name] Scale: Fair ?Sitting balance - Comments: fair-poor, 2 LOB ?  ?Standing balance support: Single extremity supported, During functional activity, No upper extremity supported ?Standing balance-[name] Scale: Poor ?Standing balance comment: continues to require at least min A for standing balance this date. brief period of close min guard  x3 stands ?  ?  ?  ?  ?  ?  ?  ?  ?  ?  ?  ?  ? ?  ?Cognition Arousal/Alertness: Awake/alert ?Behavior During Therapy: [facility] for tasks assessed/performed ?Overall Cognitive Status: Within Functional Limits for tasks assessed ?  ?  ?  ?  ?  ?  ?  ?  ?  ?  ?  ?  ?  ?  ?  ?  ?General Comments: motivated, good recall  of sessions ?  ?  ? ?  ?Exercises   ? ?  ?General Comments General comments (skin integrity, edema, etc.): VSS, pain continues in L UE, pt liked the support given from GivMohr sling ?  ?  ? ?Pertinent Vitals/Pain Pain Assessment ?Pain Assessment: Faces ?Faces Pain Scale: Hurts a little bit ?Pain Location: LUE  with PROM ?Pain Descriptors / Indicators: Discomfort, Sharp, Guarding, Grimacing ?Pain Intervention(s): Limited activity within patient's tolerance  ? ? ?Home Living   ?  ?  ?  ?  ?  ?  ?  ?  ?  ?   ?  ?Prior Function    ?  ?  ?   ? ?PT Goals (current goals can now be found in the care plan section) Acute Rehab PT Goals ?PT Goal Formulation: With patient ?Time For Goal Achievement: 09/07/21 ?Potential to Achieve Goals: Good ?Progress towards PT goals: Progressing toward goals ? ?  ?Frequency ? ? ? Min 3X/week ? ? ? ?  ?PT Plan Current plan remains appropriate  ? ? ?Co-evaluation   ?  ?  ?  ?  ? ?  ?AM-PAC PT "6 Clicks" Mobility   ?Outcome Measure ? Help needed turning from your back to your side while in a flat bed without using bedrails?: A Lot ?Help needed moving from lying on your back to sitting on the side of a flat bed without using bedrails?: A Lot ?Help needed moving to and from a bed to a chair (including a wheelchair)?: A Lot ?Help needed standing up from a chair using your arms (e.g., wheelchair or bedside chair)?: A Lot ?Help needed to walk in hospital room?: Total ?Help needed climbing 3-5 steps with a railing? : Total ?6 Click Score: 10 ? ?  ?End of Session   ?Activity Tolerance: Patient tolerated treatment well ?Patient left: in bed;with call bell/phone within reach;with bed alarm set ?Nurse Communication: Mobility status ?PT Visit Diagnosis: Other abnormalities of gait and mobility (R26.89);Muscle weakness (generalized) (M62.81);Other symptoms and signs involving the nervous system (R29.898) ?  ? ? ?Time: 9485-4627 ?PT Time Calculation (min) (ACUTE ONLY): 27 min ? ?Charges:  $Therapeutic Activity: 23-37 mins          ?          ? ?08/29/2021 ? ?Jacinto Halim., PT ?Acute Rehabilitation Services ?[phone]  (pager) ?[phone]  (office) ? ? ?Jason Figueroa ?08/29/2021, 4:46 PM ? ?

## 2021-08-29 NOTE — Progress Notes (Signed)
Orthopedic Tech Progress Note ?Patient Details:  ?Jason Figueroa ?19-Aug-1965 ?[id number] ? ?Patient ID: Jason Figueroa, male   DOB: 12-05-1965, 56 y.o.   MRN: [id number] ?I called order into [employer] for Givmohr sling ?Trinna Post ?08/29/2021, 4:54 AM ? ?

## 2021-08-29 NOTE — Progress Notes (Signed)
Occupational Therapy Treatment Patient Details Name: Jason SaferCharles K Figueroa MRN: [id number] DOB: 1966/02/11 Today's Date: 08/29/2021   History of present illness 56 y.o. male [facility] hospital on 07/25/2021 with numbness and tingling on left side of face along with L sided weakness. 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 PMH inlcudes HTN, previous stroke,  and recent TIA.   OT comments  Jason DanceKeith is making good progress, motivated to practice OOB transfers this session. Pt reports more pain "sharp/stretching" with PROM of LUE this date, however continues to demonstrate good ability to complete self PROM. He required cues and min A to transfer to sitting. He also was min -mod A for sit<>stand transfers given heavy cues. Givmorh sling donned this session with good fit and and demonstration of use to protect LUE during mobility. OT to continue to follow acutely.     Recommendations for follow up therapy are one component of a multi-disciplinary discharge planning process, led by the attending physician.  Recommendations may be updated based on patient status, additional functional criteria and insurance authorization.    Follow Up Recommendations  Skilled nursing-short term rehab (<3 hours/day)    Assistance Recommended at Discharge Frequent or constant Supervision/Assistance  Patient can return home with the following  A lot of help with walking and/or transfers;A lot of help with bathing/dressing/bathroom;Assist for transportation;Direct supervision/assist for financial management;Direct supervision/assist for medications management;Assistance with cooking/housework;Help with stairs or ramp for entrance   Equipment Recommendations  BSC/3in1;Wheelchair cushion (measurements OT);Wheelchair (measurements OT);Tub/shower bench    Recommendations for Other  Services Rehab consult    Precautions / Restrictions Precautions Precautions: Fall Precaution Comments: GivMorh sling for OOB, L resting hand splint for night time use Restrictions Weight Bearing Restrictions: No       Mobility Bed Mobility Overal bed mobility: Needs Assistance Bed Mobility: Supine to Sit   Sidelying to sit: Min assist, HOB elevated       General bed mobility comments: cues for sequence and technique, assist to hook LLE and trunk control    Transfers Overall transfer level: Needs assistance Equipment used: 1 person hand held assist Transfers: Sit to/from Stand Sit to Stand: Min assist, Mod assist           General transfer comment: fluctuated from min-mod during transfers. cues for technique     Balance Overall balance assessment: Needs assistance Sitting-balance support: Single extremity supported, No upper extremity supported, Feet supported Sitting balance-[name] Scale: Fair Sitting balance - Comments: fair-poor, 2 LOB   Standing balance support: Single extremity supported, During functional activity, No upper extremity supported Standing balance-[name] Scale: Poor Standing balance comment: continues to require at least min A for standing balance this date. brief period of close min guard                           ADL either performed or assessed with clinical judgement   ADL Overall ADL's : Needs assistance/impaired                                     Functional mobility during ADLs: Moderate assistance;Minimal assistance General ADL Comments: worked on PROM, bed mobility, sit<>stand and standing balance. GivMorh sling donned this session    Extremity/Trunk Assessment Upper Extremity Assessment Upper Extremity Assessment:  LUE deficits/detail LUE Deficits / Details: hemiplegic, hemiparesis. no trace activition noted LUE Sensation: decreased light touch;decreased proprioception LUE Coordination: decreased fine  motor;decreased gross motor   Lower Extremity Assessment Lower Extremity Assessment: Defer to PT evaluation        Vision   Vision Assessment?: No apparent visual deficits   Perception Perception Perception: Not tested   Praxis Praxis Praxis: Impaired Praxis Impairment Details: Motor planning    Cognition Arousal/Alertness: Awake/alert Behavior During Therapy: WFL for tasks assessed/performed Overall Cognitive Status: Within Functional Limits for tasks assessed                               Problem Solving: Slow processing, Requires verbal cues General Comments: motivated, good recall of exercises        Exercises General Exercises - Upper Extremity Shoulder Flexion: Self ROM, Left, 10 reps, Supine Shoulder ABduction: Self ROM, Left, 10 reps, Supine Elbow Flexion: Self ROM, 10 reps, Seated Elbow Extension: Self ROM, Left, 10 reps, Seated Wrist Flexion: Self ROM, Left, 10 reps, Seated Wrist Extension: Self ROM, Left, 10 reps, Seated Digit Composite Flexion: Self ROM, Left, 10 reps, Seated Composite Extension: Self ROM, Left, 10 reps, Seated    Shoulder Instructions       General Comments VSS onRA - more pain noted in LUE this date    Pertinent Vitals/ Pain       Pain Assessment Pain Assessment: Faces Faces Pain Scale: Hurts a little bit Pain Location: LUE with PROM Pain Descriptors / Indicators: Discomfort, Sharp, Guarding, Grimacing Pain Intervention(s): Limited activity within patient's tolerance  Home Living                                          Prior Functioning/Environment              Frequency  Min 2X/week        Progress Toward Goals  OT Goals(current goals can now be found in the care plan section)  Progress towards OT goals: Progressing toward goals  Acute Rehab OT Goals Patient Stated Goal: get to the chair OT Goal Formulation: With patient Time For Goal Achievement: 09/10/21 Potential to  Achieve Goals: Good ADL Goals Pt Will Perform Grooming: with supervision;sitting Pt Will Perform Upper Body Dressing: with set-up;sitting Pt Will Perform Lower Body Dressing: with min guard assist;sit to/from stand;sitting/lateral leans Pt Will Transfer to Toilet: with min guard assist;bedside commode;squat pivot transfer Additional ADL Goal #1: PT will complete bed mobility with S in preparation for ADL tasks Additional ADL Goal #2: Pt will independently complete self ROM  to maintain LUE [facility] for ADL tasks Additional ADL Goal #3: Pt will tolerate L resting hadn splint during night time hours without complications to protect L hand from injust and reduce risk of contracture development  Plan Discharge plan remains appropriate;Frequency remains appropriate    Co-evaluation                 AM-PAC OT "6 Clicks" Daily Activity     Outcome Measure   Help from another person eating meals?: A Little Help from another person taking care of personal grooming?: A Little Help from another person toileting, which includes using toliet, bedpan, or urinal?: A Lot Help from another person bathing (including washing, rinsing, drying)?: A Lot Help from another person to put  on and taking off regular upper body clothing?: A Lot Help from another person to put on and taking off regular lower body clothing?: A Lot 6 Click Score: 14    End of Session Equipment Utilized During Treatment: Other (comment) (GivMorh sling)  OT Visit Diagnosis: Unsteadiness on feet (R26.81);Other abnormalities of gait and mobility (R26.89);Muscle weakness (generalized) (M62.81);Pain;Hemiplegia and hemiparesis;Feeding difficulties (R63.3) Hemiplegia - Right/Left: Left Hemiplegia - dominant/non-dominant: Non-Dominant Hemiplegia - caused by: Cerebral infarction Pain - Right/Left: Left Pain - part of body: Leg   Activity Tolerance Patient tolerated treatment well   Patient Left in bed;with call bell/phone within  reach;Other (comment) (hand off to PT)   Nurse Communication Mobility status        Time: 9528-4132 OT Time Calculation (min): 33 min  Charges: OT General Charges $OT Visit: 1 Visit OT Treatments $Therapeutic Activity: 8-22 mins $Therapeutic Exercise: 8-22 mins   Dillard Pascal A Geofrey Silliman 08/29/2021, 4:33 PM

## 2021-08-30 MED ORDER — TRAZODONE HCL 100 MG PO TABS
100.0000 mg | ORAL_TABLET | Freq: Every day | ORAL | Status: DC
  Administered 2021-08-30 – 2021-09-03 (×5): 100 mg via ORAL
  Filled 2021-08-30 (×5): qty 1

## 2021-08-30 NOTE — Plan of Care (Signed)
?  Problem: Coping: ?Goal: Will verbalize positive feelings about self ?Outcome: Progressing ?  ?Problem: Coping: ?Goal: Level of anxiety will decrease ?Outcome: Progressing ?  ?

## 2021-08-30 NOTE — Progress Notes (Addendum)
? ?  Subjective ?Overnight events: None ? ?Patient doing well today, able to sleep well overnight. States that getting trazodone around 8pm is best for him so that he can fall asleep before spasms occur.  ? ?He was able to pass a large BM after dulcolax suppository yesterday. States he feels better now. No concerns or complaints at this time. ? ? ?Objective: ? ?Vital signs in last 24 hours: ?Vitals:  ? 08/29/21 1507 08/29/21 2029 08/30/21 0430 08/30/21 0810  ?BP: (!) 151/79 118/72 121/68 138/71  ?Pulse: 74 60 60 (!) 56  ?Resp: 20 20 20 16   ?Temp: 98.3 ?F (36.8 ?C) 97.9 ?F (36.6 ?C) 97.7 ?F (36.5 ?C) 97.9 ?F (36.6 ?C)  ?TempSrc: Oral Oral Oral Oral  ?SpO2:  95% 96% 97%  ?Weight:      ?Height:      ? ?General: well-appearing middle aged male, lying in bed, NAD. ?Pulm: normal work of breathing on RA. ?Abdomen: soft, nondistended, nontender, normoactive bowel sounds. ?Neuro: AAOx3, mild residual L facial droop noted. Strength in LUE and LLE remains 0/5. ?Psych: normal mood and affect. ? ? ?Assessment/Plan: ? ?Principal Problem: ?  Stroke [facility]) ?Active Problems: ?  Left-sided weakness ?  AKI (acute kidney injury) ([place]) ?  OSA (obstructive sleep apnea) ?  Adjustment disorder with mixed anxiety and depressed mood ?  Hypothyroid ? 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 No ongoing encephalopathy after baclofen dose reduction. Working with PT; left sided deficits persist. Remains stable for DC at this time, pending SNF placement. ?-Continue baclofen 10 mg BID and 40 mg qHS.  ?- continue cymbalta ?- ASA 81mg  daily, finished course of plavix ?- continue lipitor ?- PT/OT following  ?- Denied CIR; SW assisting with SNF placement ?- Outpatient sleep study recommended   ? ?Acute L Shoulder pain ?Acute L arm pain ?L shoulder pain described as tightness, relieved by stretching. L arm pain described as shooting, relieved by removing brace.  ?-- PT/OT following, appreciate recs ?-- GivMohr sling for L arm ?-- continue muscle rub cream PRN ? ?Constipation ?Indigestion, resolved ?Had a BM yesterday after dulcolax suppository. ?-maalox/mylanta ?-miralax daily ?-Continue senna-s 2 tablets twice daily ? ?Adjustment Disorder ?Patient with low mood and decreased motivation after losing independence post-stroke. Significantly improved. ?-Continue Cymbalta 30 mg qd for depression and neuropathic pain. ?-Continue trazodone 100mg  qhs along with 50mg  x1 prn for insomnia (notes improved sleep with this regimen) ? ?HTN ?BP stable and well controlled. ?- Continue Lisinopril 40 mg and amlodipine 10 mg ?- Continue Hydralazine 10 mg TID ?- HCTZ discontinued given AKI after starting   ? 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 Mental status at baseline. Repeat thyoid testing showing elevated TSH but normal free T4. ?- Continue PO synthroid 157mcg   ? ?Best Practice: ?Diet: Regular diet  ?IVF: None ?VTE: Lovenox 40 ?Code: Full ? ?Virl Axe, MD ?IMTS Resident, PGY-2 ?Pager: [phone] ?08/30/2021, 11:38 AM ?

## 2021-08-31 MED ORDER — LIDOCAINE 5 % EX PTCH
1.0000 | MEDICATED_PATCH | CUTANEOUS | Status: DC
  Filled 2021-08-31 (×10): qty 1

## 2021-08-31 NOTE — Progress Notes (Signed)
? ?  Subjective ?Overnight events: None ? ?Patient doing well today, although he does endorse spasms last night. ?He reports improvements to his left shoulder pain. ? ?He has not had a BM since Saturday, but has no pain today. No concerns or complaints at this time. ? ? ?Objective: ? ?Vital signs in last 24 hours: ?Vitals:  ? 08/30/21 1629 08/30/21 2129 08/31/21 0720 08/31/21 1108  ?BP: 107/60 126/72 120/74 126/77  ?Pulse: 65 64 (!) 53 60  ?Resp: 18 18 20 20   ?Temp: 98.1 ?F (36.7 ?C) 98.2 ?F (36.8 ?C) 97.8 ?F (36.6 ?C) 97.7 ?F (36.5 ?C)  ?TempSrc: Oral Oral Oral Oral  ?SpO2: 96% 98% 96% 95%  ?Weight:      ?Height:      ? ?General: well-appearing middle aged male, lying in bed, NAD. ?Pulm: normal work of breathing on RA. ?Abdomen: soft, nondistended, nontender, normoactive bowel sounds. ?Neuro: AAOx3, mild residual L facial droop noted; dysarthria largely resolved. Strength in LUE and LLE remains 0/5. ?Skin: No signs of pressure wounds on bilateral heels.  No TTP.  Blanching redness on bilateral feet. ?Psych: normal mood and affect. ? ? ?Assessment/Plan: ? ?Principal Problem: ?  Stroke [facility]) ?Active Problems: ?  Left-sided weakness ?  AKI (acute kidney injury) ([facility]) ?  OSA (obstructive sleep apnea) ?  Adjustment disorder with mixed anxiety and depressed mood ?  Hypothyroid ? 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 No ongoing encephalopathy after baclofen dose reduction. Working with PT; left sided deficits persist. Remains stable for DC at this time, pending SNF placement. ?-Continue baclofen 10 mg BID and 40 mg qHS.  ?- continue cymbalta ?- ASA 81mg  daily, finished course of plavix ?- continue lipitor ?- PT/OT following  ?- Denied CIR; SW assisting with  SNF placement ?- Outpatient sleep study recommended  ? ?Acute L Shoulder pain ?Acute L arm pain ?L shoulder pain described as tightness, relieved by stretching. L arm pain described as shooting, relieved by removing brace.  ?-- PT/OT following, appreciate recs ?-- GivMohr sling for L arm ?-- continue muscle rub cream PRN ? ?Constipation ?Indigestion, resolved ?Last BM Saturday after dulcolax suppository. ?-maalox/mylanta ?-miralax daily ?-Continue senna-s 2 tablets twice daily ? ?Adjustment Disorder ?Patient with low mood and decreased motivation after losing independence post-stroke. Significantly improved. ?-Continue Cymbalta 30 mg qd for depression and neuropathic pain. ?-Continue trazodone 100mg  qhs along with 50mg  x1 prn for insomnia (notes improved sleep with this regimen) ? ?HTN ?BP stable and well controlled. ?- Continue Lisinopril 40 mg and amlodipine 10 mg ?- Continue Hydralazine 10 mg TID ?- HCTZ discontinued given AKI after starting   ? 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 Mental status at baseline. Repeat thyoid testing showing elevated TSH but normal free T4. ?- Continue PO synthroid   ? ?Best Practice: ?Diet: Regular diet  ?IVF: None ?VTE: Lovenox 40 ?Code: Full ? ?Sunday, MD ?IMTS/Psych Resident  ?PGY-1 ?Pager: ?08/31/2021, 1:08 PM ?

## 2021-08-31 NOTE — Plan of Care (Signed)
?  Problem: Coping: ?Goal: Will identify appropriate support needs ?Outcome: Progressing ?  ?Problem: Self-Care: ?Goal: Ability to participate in self-care as condition permits will improve ?Outcome: Progressing ?  ?

## 2021-09-01 MED ORDER — BISACODYL 10 MG RE SUPP: 10.0000 mg | Freq: Once | RECTAL | Status: DC

## 2021-09-01 NOTE — Progress Notes (Signed)
Occupational Therapy Treatment ?Patient Details ?Name: Jason Figueroa ?MRN: [id number] ?DOB: Nov 08, 1965 ?Today's Date: 09/01/2021 ? ? ?History of present illness 56 y.o. male presents to [facility] hospital on 07/25/2021 with numbness and tingling on left side of face along with L sided weakness. 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 PMH inlcudes HTN, previous stroke,  and recent TIA. ?  ?OT comments ? OTA arrived to patient's room and had called nursing.  Patient was in recliner and asked to return to bed due discomfort from sitting up and will have phone conference with SW and mother soon. Patient was instructed on transfer technique and min/mod assist to transfer to EOB and mod assist to get to supine due to assistance with BLEs. Patient agreed to PROM exercises to LUE while supine prior to meeting and LUE RHS donned.  Acute OT to continue to follow.    ? ?Recommendations for follow up therapy are one component of a multi-disciplinary discharge planning process, led by the attending physician.  Recommendations may be updated based on patient status, additional functional criteria and insurance authorization. ?   ?Follow Up Recommendations ? Skilled nursing-short term rehab (<3 hours/day)  ?  ?Assistance Recommended at Discharge Frequent or constant Supervision/Assistance  ?Patient can return home with the following ? A lot of help with walking and/or transfers;A lot of help with bathing/dressing/bathroom;Assist for transportation;Direct supervision/assist for financial management;Direct supervision/assist for medications management;Assistance with cooking/housework;Help with stairs or ramp for entrance ?  ?Equipment Recommendations ? BSC/3in1;Wheelchair cushion (measurements OT);Wheelchair (measurements OT);Tub/shower bench  ?  ?Recommendations for Other Services   ? ?   ?Precautions / Restrictions Precautions ?Precautions: Fall ?Precaution Comments: GivMorh sling for OOB, L resting hand splint for night time use ?Restrictions ?Weight Bearing Restrictions: No  ? ? ?  ? ?Mobility Bed Mobility ?Overal bed mobility: Needs Assistance ?Bed Mobility: Sit to Supine ?  ?  ?  ?Sit to supine: Mod assist ?  ?General bed mobility comments: cues for rail use and technique with patient requiring assistance with LEs and able to position self in bed with verbal cues ?  ? ?Transfers ?Overall transfer level: Needs assistance ?Equipment used: 1 person hand held assist ?Transfers: Sit to/from Stand, Bed to chair/wheelchair/BSC ?Sit to Stand: Min assist, Mod assist ?Stand pivot transfers: Mod assist ?  ?  ?  ?  ?General transfer comment: performed transfer from recliner to EOB with min-mod assist ?  ?  ?Balance Overall balance assessment: Needs assistance ?Sitting-balance support: Single extremity supported, No upper extremity supported, Feet supported ?Sitting balance-[name] Scale: Fair ?Sitting balance - Comments: sat on EOB prior to going to supine ?  ?Standing balance support: Single extremity supported, During functional activity, No upper extremity supported ?Standing balance-[name] Scale: Poor ?Standing balance comment: stood for transfer to bed ?  ?  ?  ?  ?  ?  ?  ?  ?  ?  ?  ?   ? ?ADL either performed or assessed with clinical judgement  ? ?ADL   ?  ?  ?  ?  ?  ?  ?  ?  ?  ?  ?  ?  ?  ?  ?  ?  ?  ?  ?  ?  ?  ? ?Extremity/Trunk Assessment Upper Extremity Assessment ?LUE Deficits / Details: hemiplegic, hemiparesis. no trace activition noted ?LUE Sensation: decreased light touch;decreased  proprioception ?LUE Coordination: decreased fine motor;decreased gross motor ?  ?  ?  ?  ?  ? ?Vision   ?  ?  ?Perception   ?  ?Praxis   ?  ? ?Cognition Arousal/Alertness: Awake/alert ?Behavior [facility]: [facility] for tasks assessed/performed ?Overall Cognitive Status: Within Functional Limits for tasks  assessed ?Area of Impairment: Awareness ?  ?  ?  ?  ?  ?  ?  ?  ?  ?  ?  ?  ?  ?Awareness: Emergent ?Problem Solving: Slow processing, Requires verbal cues ?  ?  ?  ?   ?Exercises Exercises: General Upper Extremity ?General Exercises - Upper Extremity ?Shoulder Flexion: Left, 10 reps, Supine, PROM ?Shoulder Extension: Left, 10 reps, Supine, PROM ?Elbow Flexion: 10 reps, Supine, Left, PROM ?Elbow Extension: Left, 10 reps, Supine, PROM ?Wrist Flexion: Left, 10 reps, Supine, PROM ?Wrist Extension: Left, 10 reps, Supine, PROM ? ?  ?Shoulder Instructions   ? ? ?  ?General Comments    ? ? ?Pertinent Vitals/ Pain       Pain Assessment ?Pain Assessment: Faces ?Faces Pain Scale: Hurts a little bit ?Pain Location: LUE with PROM ?Pain Descriptors / Indicators: Discomfort, Sharp, Guarding, Grimacing ?Pain Intervention(s): Limited activity within patient's tolerance, Monitored during session, Repositioned ? ?Home Living   ?  ?  ?  ?  ?  ?  ?  ?  ?  ?  ?  ?  ?  ?  ?  ?  ?  ?  ? ?  ?Prior Functioning/Environment    ?  ?  ?  ?   ? ?Frequency ? Min 2X/week  ? ? ? ? ?  ?Progress Toward Goals ? ?OT Goals(current goals can now be found in the care plan section) ? Progress towards OT goals: Progressing toward goals ? ?Acute Rehab OT Goals ?Patient Stated Goal: get better ?OT Goal Formulation: With patient ?Time For Goal Achievement: 09/10/21 ?Potential to Achieve Goals: Good ?ADL Goals ?Pt Will Perform Grooming: with supervision;sitting ?Pt Will Perform Upper Body Dressing: with set-up;sitting ?Pt Will Perform Lower Body Dressing: with min guard assist;sit to/from stand;sitting/lateral leans ?Pt Will Transfer to Toilet: with min guard assist;bedside commode;squat pivot transfer ?Additional ADL Goal #1: PT will complete bed mobility with S in preparation for ADL tasks ?Additional ADL Goal #2: Pt will independently complete self ROM  to maintain LUE [facility] for ADL tasks ?Additional ADL Goal #3: Pt will tolerate L resting hadn splint during  night time hours without complications to protect L hand from injust and reduce risk of contracture development  ?Plan Discharge plan remains appropriate;Frequency remains appropriate   ? ?Co-evaluation ? ? ?   ?  ?  ?  ?  ? ?  ?AM-PAC OT "6 Clicks" Daily Activity     ?Outcome Measure ? ? Help from another person eating meals?: A Little ?Help from another person taking care of personal grooming?: A Little ?Help from another person toileting, which includes using toliet, bedpan, or urinal?: A Lot ?Help from another person bathing (including washing, rinsing, drying)?: A Lot ?Help from another person to put on and taking off regular upper body clothing?: A Lot ?Help from another person to put on and taking off regular lower body clothing?: A Lot ?6 Click Score: 14 ? ?  ?End of Session Equipment Utilized During Treatment: Other (comment) (left RHS) ? ?OT Visit Diagnosis: Unsteadiness on feet (R26.81);Other abnormalities of gait and mobility (R26.89);Muscle weakness (generalized) (M62.81);Pain;Hemiplegia and hemiparesis;Feeding difficulties (  R63.3) ?Hemiplegia - Right/Left: Left ?Hemiplegia - dominant/non-dominant: Non-Dominant ?Hemiplegia - caused by: Cerebral infarction ?Pain - Right/Left: Left ?Pain - part of body: Arm ?  ?Activity Tolerance Patient tolerated treatment well ?  ?Patient Left in bed;with call bell/phone within reach;with bed alarm set;with family/visitor present ?  ?Nurse Communication Mobility status ?  ? ?   ? ?Time: 2952-8413 ?OT Time Calculation (min): 11 min ? ?Charges: OT General Charges ?$OT Visit: 1 Visit ?OT Treatments ?$Therapeutic Activity: 8-22 mins ? ?Alfonse Flavors, OTA ?Acute Rehabilitation Services  ?Pager [phone] ?Office [phone] ? ? ?Jason Figueroa ?09/01/2021, 12:49 PM ?

## 2021-09-01 NOTE — Plan of Care (Signed)
?  Problem: Education: ?Goal: Knowledge of disease or condition will improve ?Outcome: Progressing ?Goal: Knowledge of secondary prevention will improve (SELECT ALL) ?Outcome: Progressing ?Goal: Knowledge of patient specific risk factors will improve (INDIVIDUALIZE FOR PATIENT) ?Outcome: Progressing ?Goal: Individualized Educational Video(s) ?Outcome: Progressing ?  ?Problem: Coping: ?Goal: Will verbalize positive feelings about self ?Outcome: Progressing ?Goal: Will identify appropriate support needs ?Outcome: Progressing ?  ?Problem: Health Behavior/Discharge Planning: ?Goal: Ability to manage health-related needs will improve ?Outcome: Progressing ?  ?Problem: Self-Care: ?Goal: Ability to participate in self-care as condition permits will improve ?Outcome: Progressing ?Goal: Verbalization of feelings and concerns over difficulty with self-care will improve ?Outcome: Progressing ?Goal: Ability to communicate needs accurately will improve ?Outcome: Progressing ?  ?Problem: Nutrition: ?Goal: Risk of aspiration will decrease ?Outcome: Progressing ?  ?Problem: Ischemic Stroke/TIA Tissue Perfusion: ?Goal: Complications of ischemic stroke/TIA will be minimized ?Outcome: Progressing ?  ?Problem: Education: ?Goal: Knowledge of General Education information will improve ?Description: Including pain rating scale, medication(s)/side effects and non-pharmacologic comfort measures ?Outcome: Progressing ?  ?Problem: Health Behavior/Discharge Planning: ?Goal: Ability to manage health-related needs will improve ?Outcome: Progressing ?  ?Problem: Clinical Measurements: ?Goal: Ability to maintain clinical measurements within normal limits will improve ?Outcome: Progressing ?Goal: Will remain free from infection ?Outcome: Progressing ?Goal: Diagnostic test results will improve ?Outcome: Progressing ?Goal: Respiratory complications will improve ?Outcome: Progressing ?Goal: Cardiovascular complication will be avoided ?Outcome:  Progressing ?  ?Problem: Activity: ?Goal: Risk for activity intolerance will decrease ?Outcome: Progressing ?  ?Problem: Coping: ?Goal: Level of anxiety will decrease ?Outcome: Progressing ?  ?Problem: Elimination: ?Goal: Will not experience complications related to bowel motility ?Outcome: Progressing ?Goal: Will not experience complications related to urinary retention ?Outcome: Progressing ?  ?Problem: Pain Managment: ?Goal: General experience of comfort will improve ?Outcome: Progressing ?  ?Problem: Safety: ?Goal: Ability to remain free from injury will improve ?Outcome: Progressing ?  ?Problem: Skin Integrity: ?Goal: Risk for impaired skin integrity will decrease ?Outcome: Progressing ?  ?

## 2021-09-01 NOTE — Progress Notes (Signed)
? ?  Subjective ?Overnight events: None ? ?Patient doing well today, had 2 BMs last night, so feels relieved this AM. In good spirits, and excited about Medicaid call today.   ? No concerns or complaints at this time. ? ? ?Objective: ? ?Vital signs in last 24 hours: ?Vitals:  ? 08/31/21 1509 08/31/21 1936 09/01/21 0011 09/01/21 0437  ?BP: 124/70 101/64 (!) 118/58 (!) 118/56  ?Pulse: 62 72 62 (!) 57  ?Resp: 14 18 17 18   ?Temp: 98.2 ?F (36.8 ?C) 97.9 ?F (36.6 ?C) 98 ?F (36.7 ?C) (!) 97.4 ?F (36.3 ?C)  ?TempSrc: Oral Oral Axillary Axillary  ?SpO2: 96% 96% 91% 92%  ?Weight:      ?Height:      ? ?General: well-appearing middle aged male, lying in bed, NAD. ?Pulm: normal work of breathing on RA. ?Neuro: AAOx3, mild residual L facial droop noted; dysarthria largely resolved. Strength in LUE and LLE remains 0/5. ?Psych: normal mood and affect. ? ? ?Assessment/Plan: ? ?Principal Problem: ?  Stroke [facility]) ?Active Problems: ?  Left-sided weakness ?  AKI (acute kidney injury) ([facility]) ?  OSA (obstructive sleep apnea) ?  Adjustment disorder with mixed anxiety and depressed mood ?  Hypothyroid ? 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 No ongoing encephalopathy after baclofen dose reduction. Working with PT; left sided deficits persist. Remains stable for DC at this time, pending SNF placement. ?-Continue baclofen 10 mg BID and 40 mg qHS.  ?- continue cymbalta ?- ASA 81mg  daily, finished course of plavix ?- continue lipitor ?- PT/OT following  ?- Denied CIR; SW assisting with SNF placement; call for Medicaid today ?- Outpatient sleep study recommended  ? ?Acute L Shoulder pain, resolved ?Acute L arm pain, resolved ?L shoulder pain described as tightness, relieved by  stretching. L arm pain described as shooting, relieved by removing brace.  ?-- PT/OT following, appreciate recs ?-- GivMohr sling for L arm ?-- continue muscle rub cream PRN ? ?Constipation, resolved ?Indigestion, resolved ?2 BM yesterday on current bowel regimen. ?-maalox/mylanta ?-miralax daily ?-Continue senna-s 2 tablets twice daily ? ?Adjustment Disorder ?Patient with low mood and decreased motivation after losing independence post-stroke. Significantly improved. ?-Continue Cymbalta 30 mg qd for depressive sx and neuropathic pain. ?-Continue trazodone 100mg  qhs along with 50mg  x1 prn for insomnia (notes improved sleep with this regimen) ? ?HTN ?BP stable and well controlled. ?- Continue Lisinopril 40 mg and amlodipine 10 mg ?- Continue Hydralazine 10 mg TID ?- HCTZ discontinued given AKI after starting   ? 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 Mental status at baseline. Repeat thyoid testing showing elevated TSH but normal free T4. ?- Continue PO synthroid Pearlean Brownie   ? ?Best Practice: ?Diet: Regular diet  ?IVF: None ?VTE: Lovenox 40 ?Code: Full ? ? , MD ?IMTS/Psych Resident  ?PGY-1 ?Pager: ?09/01/2021, 6:38 AM ?

## 2021-09-01 NOTE — Progress Notes (Signed)
Physical Therapy Treatment ?Patient Details ?Name: Jason Figueroa ?MRN: [id number] ?DOB: 1965/07/27 ?Today's Date: 09/01/2021 ? ? ?History of Present Illness 56 y.o. male presents to [facility] hospital on 07/25/2021 with numbness and tingling on left side of face along with L sided weakness. 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 PMH inlcudes HTN, previous stroke,  and recent TIA. ? ?  ?PT Comments  ? ? Patient received in bed, he is agreeable to PT session. Patient required min guard for bed mobility with use of bed rails. Transfers with min/mod assist to wheelchair and back to bed. Patient was able to self propel wheelchair for 150 feet using R UE and LE. Cues and assist needed for wheelchair management. Patient will continue to benefit from skilled PT while here to improve functional independence and safety with mobility.  ?    ?Recommendations for follow up therapy are one component of a multi-disciplinary discharge planning process, led by the attending physician.  Recommendations may be updated based on patient status, additional functional criteria and insurance authorization. ? ?Follow Up Recommendations ? Skilled nursing-short term rehab (<3 hours/day) ?  ?  ?Assistance Recommended at Discharge Intermittent Supervision/Assistance  ?Patient can return home with the following A little help with walking and/or transfers;A little help with bathing/dressing/bathroom;Assist for transportation ?  ?Equipment Recommendations ? Wheelchair (measurements PT);Wheelchair cushion (measurements PT);Hospital bed  ?  ?Recommendations for Other Services   ? ? ?  ?Precautions / Restrictions Precautions ?Precautions: Fall ?Precaution Comments: GivMorh sling for OOB, L resting hand splint for night time use ?Restrictions ?Weight Bearing Restrictions: No  ?  ? ?Mobility ? Bed Mobility ?Overal  bed mobility: Needs Assistance ?Bed Mobility: Supine to Sit, Sit to Supine ?  ?  ?Supine to sit: Min guard, HOB elevated ?  ?Sit to sidelying: Min guard ?General bed mobility comments: heavy use of rail for supine to sit, but only min guard to raise trunk. Patient able to return to supine from sitting with min guard and able to scoot himself up in bed pulling from headboard above. ?  ? ?Transfers ?Overall transfer level: Needs assistance ?Equipment used: None ?Transfers: Bed to chair/wheelchair/BSC ?  ?Stand pivot transfers: Min assist ?  ?  ?  ?  ?General transfer comment: transfers from bed><wheelchair ?  ? ?Ambulation/Gait ?  ?  ?  ?  ?  ?  ?  ?General Gait Details: not able ? ? ?Stairs ?  ?  ?  ?  ?  ? ? ?Wheelchair Mobility ?Wheelchair Mobility ?Wheelchair mobility: Yes ?Wheelchair propulsion: Right upper extremity, Right lower extremity ?Wheelchair parts: Supervision/cueing ?Distance: 150 ?Wheelchair Assistance Details (indicate cue type and reason): cues and assist to manage parts of wheelchair ( breaks, leg rest, removing arm rest) ? ?Modified Rankin (Stroke Patients Only) ?Modified Rankin (Stroke Patients Only) ?Pre-Morbid Rankin Score: No symptoms ?Modified Rankin: Moderately severe disability ? ? ?  ?Balance Overall balance assessment: Needs assistance ?Sitting-balance support: Single extremity supported, Feet supported ?Sitting balance-[name] Scale: Good ?Sitting balance - Comments: able to sit at edge of bed with supervision ?  ?Standing balance support: Single extremity supported, During functional activity ?Standing balance-[name] Scale: Fair ?Standing balance comment: requires single UE support for transfers and min/mod assist. ?  ?  ?  ?  ?  ?  ?  ?  ?  ?  ?  ?  ? ?  ?  Cognition Arousal/Alertness: Awake/alert ?Behavior During Therapy: [facility] for tasks assessed/performed ?Overall Cognitive Status: Within Functional Limits for tasks assessed ?Area of Impairment: Problem solving, Awareness ?  ?  ?  ?  ?  ?   ?  ?  ?  ?Current Attention Level: Selective ?  ?Following Commands: Follows one step commands consistently ?Safety/Judgement: Decreased awareness of safety, Decreased awareness of deficits ?Awareness: Intellectual ?Problem Solving: Requires verbal cues ?General Comments: motivated, good recall of sessions ?  ?  ? ?  ?Exercises   ? ?  ?General Comments   ?  ?  ? ?Pertinent Vitals/Pain Pain Assessment ?Pain Assessment: No/denies pain  ? ? ?Home Living   ?  ?  ?  ?  ?  ?  ?  ?  ?  ?   ?  ?Prior Function    ?  ?  ?   ? ?PT Goals (current goals can now be found in the care plan section) Acute Rehab PT Goals ?Patient Stated Goal: to regain strength and independence ?PT Goal Formulation: With patient ?Time For Goal Achievement: 09/07/21 ?Potential to Achieve Goals: Good ?Progress towards PT goals: Progressing toward goals ? ?  ?Frequency ? ? ? Min 3X/week ? ? ? ?  ?PT Plan Current plan remains appropriate  ? ? ?Co-evaluation   ?  ?  ?  ?  ? ?  ?AM-PAC PT "6 Clicks" Mobility   ?Outcome Measure ? Help needed turning from your back to your side while in a flat bed without using bedrails?: A Little ?Help needed moving from lying on your back to sitting on the side of a flat bed without using bedrails?: A Little ?Help needed moving to and from a bed to a chair (including a wheelchair)?: A Lot ?Help needed standing up from a chair using your arms (e.g., wheelchair or bedside chair)?: A Lot ?Help needed to walk in hospital room?: Total ?Help needed climbing 3-5 steps with a railing? : Total ?6 Click Score: 12 ? ?  ?End of Session Equipment Utilized During Treatment: Gait belt ?Activity Tolerance: Patient tolerated treatment well ?Patient left: in bed;with call bell/phone within reach;with bed alarm set ?Nurse Communication: Mobility status ?PT Visit Diagnosis: Other abnormalities of gait and mobility (R26.89);Muscle weakness (generalized) (M62.81);Other symptoms and signs involving the nervous system (R29.898);Unsteadiness on  feet (R26.81) ?  ? ? ?Time: 9371-6967 ?PT Time Calculation (min) (ACUTE ONLY): 23 min ? ?Charges:  $Therapeutic Activity: 23-37 mins          ?          ? ?Lissa Merlin, PT, GCS ?09/01/21,3:55 PM ? ?

## 2021-09-02 NOTE — Progress Notes (Signed)
Physical Therapy Treatment ?Patient Details ?Name: Jason Figueroa ?MRN: [id number] ?DOB: Jun 24, 1965 ?Today's Date: 09/02/2021 ? ? ?History of Present Illness 56 y.o. male presents to [facility] hospital on 07/25/2021 with numbness and tingling on left side of face along with L sided weakness. 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 PMH inlcudes HTN, previous stroke,  and recent TIA. ? ?  ?PT Comments  ? ? Pt progressing well towards his physical therapy goals. Performed LLE stretching prior to out of bed mobility. Donned XL GivMohr sling edge of bed. Pt transferring to w/c with min-mod assist. Propelled w/c hallway distances with RUE/RLE; pt states, "it feels good to get out of room." Worked on sit to stands with right railing and weight shifting. Noted L hip abductor and adductor flicker, but no other active movement noted. Will continue to progress as tolerated. ?   ?Recommendations for follow up therapy are one component of a multi-disciplinary discharge planning process, led by the attending physician.  Recommendations may be updated based on patient status, additional functional criteria and insurance authorization. ? ?Follow Up Recommendations ? Skilled nursing-short term rehab (<3 hours/day) ?  ?  ?Assistance Recommended at Discharge Intermittent Supervision/Assistance  ?Patient can return home with the following A little help with walking and/or transfers;A little help with bathing/dressing/bathroom;Assist for transportation ?  ?Equipment Recommendations ? Wheelchair (measurements PT);Wheelchair cushion (measurements PT);Hospital bed  ?  ?Recommendations for Other Services   ? ? ?  ?Precautions / Restrictions Precautions ?Precautions: Fall ?Precaution Comments: GivMohr sling for OOB, L resting hand splint for night time use ?Restrictions ?Weight Bearing Restrictions: No  ?   ? ?Mobility ? Bed Mobility ?Overal bed mobility: Needs Assistance ?Bed Mobility: Supine to Sit, Sit to Supine ?  ?  ?Supine to sit: Min assist ?Sit to supine: Min assist ?  ?General bed mobility comments: Pt with use of RLE to bridge up and scoot hips over, hooking RLE underneath LLE to bring across bed, minA at trunk to power up from elevated head of bed ?  ? ?Transfers ?Overall transfer level: Needs assistance ?Equipment used: None ?Transfers: Sit to/from Stand, Bed to chair/wheelchair/BSC ?Sit to Stand: Min assist, Mod assist ?Stand pivot transfers: Min assist ?  ?  ?  ?  ?General transfer comment: Pt requiring min-modA for transfers from bed <> w/c and standing pulling up on R rail x 3. manual assist for LLE placement ?  ? ?Ambulation/Gait ?  ?  ?  ?  ?  ?  ?  ?  ? ? ?Stairs ?  ?  ?  ?  ?  ? ? ?Wheelchair Mobility ?Wheelchair Mobility ?Wheelchair mobility: Yes ?Wheelchair propulsion: Right upper extremity, Right lower extremity ?Wheelchair parts: Needs assistance ?Distance: 120 ?Wheelchair Assistance Details (indicate cue type and reason): cues and assist to manage parts of wheelchair ( breaks, leg rest, removing arm rest), obstacle negotiation. ? ?Modified Rankin (Stroke Patients Only) ?Modified Rankin (Stroke Patients Only) ?Pre-Morbid Rankin Score: No symptoms ?Modified Rankin: Severe disability ? ? ?  ?Balance Overall balance assessment: Needs assistance ?Sitting-balance support: Single extremity supported, Feet supported ?Sitting balance-[name] Scale: Fair ?  ?  ?Standing balance support: Single extremity supported, During functional activity ?Standing balance-[name] Scale: Fair ?  ?  ?  ?  ?  ?  ?  ?  ?  ?  ?  ?  ?  ? ?  ?  Cognition Arousal/Alertness: Awake/alert ?Behavior During Therapy: [facility] for tasks assessed/performed ?Overall Cognitive Status: Impaired/Different from baseline ?Area of Impairment: Problem solving ?  ?  ?  ?  ?  ?  ?  ?  ?  ?  ?  ?  ?  ?  ?Problem Solving: Requires verbal cues ?  ?  ?  ? ?   ?Exercises Other Exercises ?Other Exercises: Supine: LLE hamstring stretch x 1 min, self piriformis stretch x 30 s, ankle PROM x 10 ? ?  ?General Comments   ?  ?  ? ?Pertinent Vitals/Pain Pain Assessment ?Pain Assessment: Faces ?Faces Pain Scale: Hurts a little bit ?Pain Location: LLE with ROM ?Pain Descriptors / Indicators: Discomfort, Grimacing ?Pain Intervention(s): Monitored during session  ? ? ?Home Living   ?  ?  ?  ?  ?  ?  ?  ?  ?  ?   ?  ?Prior Function    ?  ?  ?   ? ?PT Goals (current goals can now be found in the care plan section) Acute Rehab PT Goals ?Patient Stated Goal: to regain strength and independence ?Potential to Achieve Goals: Good ?Progress towards PT goals: Progressing toward goals ? ?  ?Frequency ? ? ? Min 3X/week ? ? ? ?  ?PT Plan Current plan remains appropriate  ? ? ?Co-evaluation   ?  ?  ?  ?  ? ?  ?AM-PAC PT "6 Clicks" Mobility   ?Outcome Measure ? Help needed turning from your back to your side while in a flat bed without using bedrails?: A Little ?Help needed moving from lying on your back to sitting on the side of a flat bed without using bedrails?: A Little ?Help needed moving to and from a bed to a chair (including a wheelchair)?: A Little ?Help needed standing up from a chair using your arms (e.g., wheelchair or bedside chair)?: A Lot ?Help needed to walk in hospital room?: Total ?Help needed climbing 3-5 steps with a railing? : Total ?6 Click Score: 13 ? ?  ?End of Session Equipment Utilized During Treatment: Gait belt ?Activity Tolerance: Patient tolerated treatment well ?Patient left: in bed;with call bell/phone within reach;with bed alarm set ?Nurse Communication: Mobility status ?PT Visit Diagnosis: Other abnormalities of gait and mobility (R26.89);Muscle weakness (generalized) (M62.81);Other symptoms and signs involving the nervous system (R29.898);Unsteadiness on feet (R26.81) ?  ? ? ?Time: 5621-3086 ?PT Time Calculation (min) (ACUTE ONLY): 46 min ? ?Charges:   $Therapeutic Activity: 38-52 mins          ?          ? ?Lillia Pauls, PT, DPT ?Acute Rehabilitation Services ?Pager [phone] ?Office [phone] ? ? ? ?Carloine Ernestina Penna ?09/02/2021, 5:02 PM ? ?

## 2021-09-02 NOTE — Progress Notes (Signed)
? ?  Subjective ?Overnight events: None ? ?Patient doing well today. In good spirits, knows he has to resubmit information for Medicaid transfer. ? No concerns or complaints at this time. ? ? ?Objective: ? ?Vital signs in last 24 hours: ?Vitals:  ? 09/01/21 2313 09/02/21 0347 09/02/21 0725 09/02/21 1112  ?BP: 114/67 103/69 113/71 120/73  ?Pulse: (!) 57 (!) 56 64 (!) 58  ?Resp: 18 19 16 20   ?Temp: 98.2 ?F (36.8 ?C) 98 ?F (36.7 ?C) 97.7 ?F (36.5 ?C) 98.1 ?F (36.7 ?C)  ?TempSrc: Oral Oral Oral Oral  ?SpO2: 93% 93% 95% 97%  ?Weight:      ?Height:      ? ?General: well-appearing middle aged male, lying in bed, NAD. ?Pulm: normal work of breathing on RA. ?Neuro: AAOx3, mild residual L facial droop noted; dysarthria largely resolved. Strength in LUE and LLE remains 0/5. ?Psych: normal mood and affect. ? ? ?Assessment/Plan: ? ?Principal Problem: ?  Stroke [facility]) ?Active Problems: ?  Left-sided weakness ?  AKI (acute kidney injury) (HCC) ?  OSA (obstructive sleep apnea) ?  Adjustment disorder with mixed anxiety and depressed mood ?  Hypothyroid ? 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 No ongoing encephalopathy after baclofen dose reduction. Working with PT; left sided deficits persist. Remains stable for DC at this time, pending SNF placement. ?-Continue baclofen 10 mg BID and 40 mg qHS.  ?- continue cymbalta ?- ASA 81mg  daily, finished course of plavix ?- continue lipitor ?- PT/OT following  ?- Denied CIR; SW assisting with SNF placement; call for Medicaid today ?- Outpatient sleep study recommended  ? ?Acute L Shoulder pain, resolved ?Acute L arm pain, resolved ?L shoulder pain described as tightness, relieved by stretching. L arm pain described as shooting,  relieved by removing brace.  ?-- PT/OT following, appreciate recs ?-- GivMohr sling for L arm ?-- continue muscle rub cream PRN ? ?Constipation, resolved ?Indigestion, resolved ?2 BM yesterday on current bowel regimen. ?-maalox/mylanta ?-miralax daily ?-Continue senna-s 2 tablets twice daily ? ?Adjustment Disorder ?Patient with low mood and decreased motivation after losing independence post-stroke. Significantly improved. ?-Continue Cymbalta 30 mg qd for depressive sx and neuropathic pain. ?-Continue trazodone 100mg  qhs along with 50mg  x1 prn for insomnia (notes improved sleep with this regimen) ? ?HTN ?BP stable and well controlled. ?- Continue Lisinopril 40 mg and amlodipine 10 mg ?- Continue Hydralazine 10 mg TID ?- HCTZ discontinued given AKI after starting   ? 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 Mental status at baseline. Repeat thyoid testing showing elevated TSH but normal free T4. ?- Continue PO synthroid Pearlean Brownie   ? ?Best Practice: ?Diet: Regular diet  ?IVF: None ?VTE: Lovenox 40 ?Code: Full ? ? , MD ?IMTS/Psych Resident  ?PGY-1 ?Pager: ?09/02/2021, 11:37 AM ?

## 2021-09-03 NOTE — Progress Notes (Signed)
Occupational Therapy Treatment ?Patient Details ?Name: Jason Figueroa ?MRN: [id number] ?DOB: August 14, 1965 ?Today's Date: 09/03/2021 ? ? ?History of present illness 56 y.o. male presents to [facility] hospital on 07/25/2021 with numbness and tingling on left side of face along with L sided weakness. 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 PMH inlcudes HTN, previous stroke,  and recent TIA. ?  ?OT comments ? Patient received in bed and stated he had "stomach issues" and did not want to get out of bed today but was agreeable to stand from bed side.  Patient was min assist to get to EOB and performed 3 stands and tolerated 1 minute each. Patient performed HEP for LUE SROM with min verbal cues to perform correctly. Acute OT to continue to follow.   ? ?Recommendations for follow up therapy are one component of a multi-disciplinary discharge planning process, led by the attending physician.  Recommendations may be updated based on patient status, additional functional criteria and insurance authorization. ?   ?Follow Up Recommendations ? Skilled nursing-short term rehab (<3 hours/day)  ?  ?Assistance Recommended at Discharge Frequent or constant Supervision/Assistance  ?Patient can return home with the following ? A lot of help with walking and/or transfers;A lot of help with bathing/dressing/bathroom;Assist for transportation;Direct supervision/assist for financial management;Direct supervision/assist for medications management;Assistance with cooking/housework;Help with stairs or ramp for entrance ?  ?Equipment Recommendations ? BSC/3in1;Wheelchair cushion (measurements OT);Wheelchair (measurements OT);Tub/shower bench  ?  ?Recommendations for Other Services   ? ?  ?Precautions / Restrictions Precautions ?Precautions: Fall ?Precaution Comments: GivMohr sling for OOB, L resting hand splint  for night time use ?Restrictions ?Weight Bearing Restrictions: No  ? ? ?  ? ?Mobility Bed Mobility ?Overal bed mobility: Needs Assistance ?Bed Mobility: Supine to Sit, Sit to Supine ?  ?  ?Supine to sit: Min assist ?Sit to supine: Min assist ?  ?General bed mobility comments: min assist with trunk to get to EOB and min assist with LEs getting back to supine ?  ? ?Transfers ?Overall transfer level: Needs assistance ?Equipment used: None ?Transfers: Sit to/from Stand ?Sit to Stand: Min assist, Mod assist ?  ?  ?  ?  ?  ?General transfer comment: Patient performed 3 stands from EOB with min-mod assist to power up ?  ?  ?Balance Overall balance assessment: Needs assistance ?Sitting-balance support: Single extremity supported, Feet supported ?Sitting balance-[name] Scale: Fair ?Sitting balance - Comments: able to sit at edge of bed with supervision ?  ?Standing balance support: Single extremity supported, During functional activity ?Standing balance-[name] Scale: Fair ?Standing balance comment: performed standing from EOB with RUE support and tolerated one minute for each stand x3 ?  ?  ?  ?  ?  ?  ?  ?  ?  ?  ?  ?   ? ?ADL either performed or assessed with clinical judgement  ? ?ADL   ?  ?  ?  ?  ?  ?  ?  ?  ?  ?  ?  ?  ?  ?  ?  ?  ?  ?  ?  ?  ?  ? ?Extremity/Trunk Assessment Upper Extremity Assessment ?LUE Deficits / Details: hemiplegic, hemiparesis. no trace activition noted ?LUE Sensation: decreased light touch;decreased proprioception ?LUE Coordination: decreased fine motor;decreased gross motor ?  ?  ?  ?  ?  ? ?Vision   ?  ?  ?  Perception   ?  ?Praxis   ?  ? ?Cognition Arousal/Alertness: Awake/alert ?Behavior During Therapy: [facility] for tasks assessed/performed ?Overall Cognitive Status: Impaired/Different from baseline ?Area of Impairment: Problem solving ?  ?  ?  ?  ?  ?  ?  ?  ?  ?Current Attention Level: Selective ?  ?Following Commands: Follows one step commands consistently ?Safety/Judgement: Decreased awareness of  safety, Decreased awareness of deficits ?Awareness: Intellectual ?Problem Solving: Requires verbal cues ?General Comments: good recall of LUE HEP ?  ?  ?   ?Exercises Exercises: General Upper Extremity ?General Exercises - Upper Extremity ?Shoulder Flexion: Left, 10 reps, Supine, Self ROM ?Shoulder Extension: Left, 10 reps, Supine, Self ROM ?Shoulder ABduction: Self ROM, Left, 10 reps, Supine ?Shoulder ADduction: Self ROM, Left, 10 reps, Supine ?Shoulder Horizontal ABduction: Self ROM, Left, 10 reps, Supine ?Elbow Flexion: 10 reps, Supine, Left, Self ROM ?Elbow Extension: Self ROM, Left, 10 reps ? ?  ?Shoulder Instructions   ? ? ?  ?General Comments    ? ? ?Pertinent Vitals/ Pain       Pain Assessment ?Pain Assessment: Faces ?Faces Pain Scale: Hurts a little bit ?Pain Location: left thumb during ROM ?Pain Descriptors / Indicators: Discomfort, Grimacing ?Pain Intervention(s): Monitored during session, Repositioned ? ?Home Living   ?  ?  ?  ?  ?  ?  ?  ?  ?  ?  ?  ?  ?  ?  ?  ?  ?  ?  ? ?  ?Prior Functioning/Environment    ?  ?  ?  ?   ? ?Frequency ? Min 2X/week  ? ? ? ? ?  ?Progress Toward Goals ? ?OT Goals(current goals can now be found in the care plan section) ? Progress towards OT goals: Progressing toward goals ? ?Acute Rehab OT Goals ?Patient Stated Goal: get better ?OT Goal Formulation: With patient ?Time For Goal Achievement: 09/10/21 ?Potential to Achieve Goals: Good ?ADL Goals ?Pt Will Perform Grooming: with supervision;sitting ?Pt Will Perform Upper Body Dressing: with set-up;sitting ?Pt Will Perform Lower Body Dressing: with min guard assist;sit to/from stand;sitting/lateral leans ?Pt Will Transfer to Toilet: with min guard assist;bedside commode;squat pivot transfer ?Additional ADL Goal #1: PT will complete bed mobility with S in preparation for ADL tasks ?Additional ADL Goal #2: Pt will independently complete self ROM  to maintain LUE [facility] for ADL tasks ?Additional ADL Goal #3: Pt will tolerate L resting  hadn splint during night time hours without complications to protect L hand from injust and reduce risk of contracture development  ?Plan Discharge plan remains appropriate;Frequency remains appropriate   ? ?Co-evaluation ? ? ?   ?  ?  ?  ?  ? ?  ?AM-PAC OT "6 Clicks" Daily Activity     ?Outcome Measure ? ? Help from another person eating meals?: A Little ?Help from another person taking care of personal grooming?: A Little ?Help from another person toileting, which includes using toliet, bedpan, or urinal?: A Lot ?Help from another person bathing (including washing, rinsing, drying)?: A Lot ?Help from another person to put on and taking off regular upper body clothing?: A Lot ?Help from another person to put on and taking off regular lower body clothing?: A Lot ?6 Click Score: 14 ? ?  ?End of Session   ? ?OT Visit Diagnosis: Unsteadiness on feet (R26.81);Other abnormalities of gait and mobility (R26.89);Muscle weakness (generalized) (M62.81);Pain;Hemiplegia and hemiparesis;Feeding difficulties (R63.3) ?Hemiplegia - Right/Left: Left ?Hemiplegia - dominant/non-dominant: Non-Dominant ?  Hemiplegia - caused by: Cerebral infarction ?Pain - Right/Left: Left ?Pain - part of body: Arm ?  ?Activity Tolerance Patient tolerated treatment well ?  ?Patient Left in bed;with call bell/phone within reach;with bed alarm set ?  ?Nurse Communication Mobility status ?  ? ?   ? ?Time: 7673-4193 ?OT Time Calculation (min): 23 min ? ?Charges: OT General Charges ?$OT Visit: 1 Visit ?OT Treatments ?$Therapeutic Activity: 8-22 mins ?$Therapeutic Exercise: 8-22 mins ? ?Alfonse Flavors, OTA ?Acute Rehabilitation Services  ?Pager [phone] ?Office [phone] ? ? ?Shazia Mitchener Jeannett Senior ?09/03/2021, 12:59 PM ?

## 2021-09-03 NOTE — Plan of Care (Signed)
°  Problem: Education: °Goal: Knowledge of disease or condition will improve °Outcome: Progressing °Goal: Knowledge of secondary prevention will improve (SELECT ALL) °Outcome: Progressing °Goal: Knowledge of patient specific risk factors will improve (INDIVIDUALIZE FOR PATIENT) °Outcome: Progressing °Goal: Individualized Educational Video(s) °Outcome: Progressing °  °Problem: Ischemic Stroke/TIA Tissue Perfusion: °Goal: Complications of ischemic stroke/TIA will be minimized °Outcome: Progressing °  °

## 2021-09-03 NOTE — Progress Notes (Signed)
? ?  Subjective ?Overnight events: None ? ?Patient doing well today. He reports excitement about getting out of his room in the wheelchair for the past 2 days and looks forward to continuing to work with PT/OT. ? ?No concerns or complaints at this time. ? ? ?Objective: ? ?Vital signs in last 24 hours: ?Vitals:  ? 09/02/21 1455 09/02/21 1946 09/02/21 2309 09/03/21 0308  ?BP: 105/68 118/72 112/69 103/60  ?Pulse: (!) 59 62 (!) 53 (!) 50  ?Resp: 16 16 16 16   ?Temp: 98.1 ?F (36.7 ?C) 98.1 ?F (36.7 ?C) 97.7 ?F (36.5 ?C) (!) 97.5 ?F (36.4 ?C)  ?TempSrc: Oral Oral Oral Oral  ?SpO2: 95% 96% 97% 96%  ?Weight:      ?Height:      ? ?General: well-appearing middle aged male, lying in bed, NAD. ?Pulm: normal work of breathing on RA. ?Neuro: AAOx3, mild residual L facial droop noted; dysarthria largely resolved. Strength in LUE and LLE remains 0/5. ?Psych: normal mood and affect. ? ? ?Assessment/Plan: ? ?Principal Problem: ?  Stroke [facility]) ?Active Problems: ?  Left-sided weakness ?  AKI (acute kidney injury) (HCC) ?  OSA (obstructive sleep apnea) ?  Adjustment disorder with mixed anxiety and depressed mood ?  Hypothyroid ? 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 No ongoing encephalopathy after baclofen dose reduction. Working with PT; left sided deficits persist. Remains stable for DC at this time, pending SNF placement. ?-Continue baclofen 10 mg BID and 40 mg qHS.  ?- continue cymbalta ?- ASA 81mg  daily, finished course of plavix ?- continue lipitor ?- PT/OT following  ?- Denied CIR; SW assisting with SNF placement; call for Medicaid today ?- Outpatient sleep study recommended  ? ?Acute L Shoulder pain, resolved ?Acute L arm pain, resolved ?L shoulder pain described  as tightness, relieved by stretching. L arm pain described as shooting, relieved by removing brace.  ?-- PT/OT following, appreciate recs ?-- GivMohr sling for L arm ?-- continue muscle rub cream PRN ? ?Constipation, resolved ?Indigestion, resolved ?2 BM yesterday on current bowel regimen. ?-maalox/mylanta ?-miralax daily ?-Continue senna-s 2 tablets twice daily ? ?Adjustment Disorder ?Patient with low mood and decreased motivation after losing independence post-stroke. Significantly improved. ?-Continue Cymbalta 30 mg qd for depressive sx and neuropathic pain. ?-Continue trazodone 100mg  qhs along with 50mg  x1 prn for insomnia (notes improved sleep with this regimen) ? ?HTN ?BP stable and well controlled. ?- Continue Lisinopril 40 mg and amlodipine 10 mg ?- Continue Hydralazine 10 mg TID ?- HCTZ discontinued given AKI after starting   ? 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 Mental status at baseline. Repeat thyoid testing showing elevated TSH but normal free T4. ?- Continue PO synthroid Pearlean Brownie   ? ?Best Practice: ?Diet: Regular diet  ?IVF: None ?VTE: Lovenox 40 ?Code: Full ? ? , MD ?IMTS/Psych Resident  ?PGY-1 ?Pager: ?09/03/2021, 6:21 AM ?

## 2021-09-04 MED ORDER — TRAZODONE HCL 50 MG PO TABS
150.0000 mg | ORAL_TABLET | Freq: Every day | ORAL | Status: DC
  Administered 2021-09-04 – 2021-09-10 (×7): 150 mg via ORAL
  Filled 2021-09-04 (×7): qty 1

## 2021-09-04 NOTE — Progress Notes (Signed)
Physical Therapy Treatment ?Patient Details ?Name: Jason Figueroa ?MRN: [id number] ?DOB: January 10, 1966 ?Today's Date: 09/04/2021 ? ? ?History of Present Illness 56 y.o. male presents to [facility] hospital on 07/25/2021 with numbness and tingling on left side of face along with L sided weakness. 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 PMH inlcudes HTN, previous stroke,  and recent TIA. ? ?  ?PT Comments  ? ? Pt showing frustration over L arm limitations and that his L chronically painful knee is starting to be painful and limiting.  Emphasis on intentional listening, explaining what some of the important activities should be for optimal independence and practice with sit to stands and standing balance activity using the HW. ?   ?Recommendations for follow up therapy are one component of a multi-disciplinary discharge planning process, led by the attending physician.  Recommendations may be updated based on patient status, additional functional criteria and insurance authorization. ? ?Follow Up Recommendations ? Skilled nursing-short term rehab (<3 hours/day) ?  ?  ?Assistance Recommended at Discharge Intermittent Supervision/Assistance  ?Patient can return home with the following A little help with walking and/or transfers;A little help with bathing/dressing/bathroom;Assist for transportation;Help with stairs or ramp for entrance ?  ?Equipment Recommendations ? Wheelchair (measurements PT);Wheelchair cushion (measurements PT);Hospital bed  ?  ?Recommendations for Other Services   ? ? ?  ?Precautions / Restrictions Precautions ?Precautions: Fall ?Precaution Comments: GivMohr sling for OOB, L resting hand splint for night time use  ?  ? ?Mobility ? Bed Mobility ?Overal bed mobility: Needs Assistance ?Bed Mobility: Supine to Sit, Sit to Supine ?  ?  ?Supine to sit: Min assist, Mod  assist ?Sit to supine: Mod assist ?  ?General bed mobility comments: pt needs some assist bringing L side toward the right ?  ? ?Transfers ?Overall transfer level: Needs assistance ?Equipment used: None ?Transfers: Sit to/from Stand ?Sit to Stand: Mod assist, Min assist (x3) ?  ?  ?  ?  ?  ?General transfer comment: assisted pt comeing forward and with boost up and controlled descent. ?  ? ?Ambulation/Gait ?  ?  ?  ?  ?  ?  ?  ?General Gait Details: not able ? ? ?Stairs ?  ?  ?  ?  ?  ? ? ?Wheelchair Mobility ?  ? ?Modified Rankin (Stroke Patients Only) ?Modified Rankin (Stroke Patients Only) ?Modified Rankin: Severe disability ? ? ?  ?Balance Overall balance assessment: Needs assistance ?  ?Sitting balance-[name] Scale: Fair (to poor initially) ?Sitting balance - Comments: supervision after initial occasional need of R UE assist  during "warm up. ?  ?  ?Standing balance-[name] Scale: Poor ?Standing balance comment: used hemiwalker to work on standing balance over 2 min x 3 trials. ?  ?  ?  ?  ?  ?  ?  ?  ?  ?  ?  ?  ? ?  ?Cognition Arousal/Alertness: Awake/alert ?Behavior During Therapy: [facility] for tasks assessed/performed ?Overall Cognitive Status:  (likely not baseline, but functional for therapy interventions) ?  ?  ?  ?  ?  ?  ?  ?  ?  ?  ?  ?  ?  ?  ?  ?  ?  ?  ?  ? ?  ?Exercises   ? ?  ?General Comments   ?  ?  ? ?Pertinent Vitals/Pain Pain Assessment ?Pain Assessment:  Faces ?Faces Pain Scale: Hurts even more ?Pain Location: L knee (prior to stroke L knee pain was a chronic limiting problem) ?Pain Descriptors / Indicators: Grimacing, Sharp, Sore ?Pain Intervention(s): Monitored during session  ? ? ?Home Living   ?  ?  ?  ?  ?  ?  ?  ?  ?  ?   ?  ?Prior Function    ?  ?  ?   ? ?PT Goals (current goals can now be found in the care plan section) Acute Rehab PT Goals ?PT Goal Formulation: With patient ?Time For Goal Achievement: 09/07/21 ?Potential to Achieve Goals: Good ?Progress towards PT goals: Progressing toward  goals ? ?  ?Frequency ? ? ? Min 3X/week ? ? ? ?  ?PT Plan Current plan remains appropriate  ? ? ?Co-evaluation   ?  ?  ?  ?  ? ?  ?AM-PAC PT "6 Clicks" Mobility   ?Outcome Measure ? Help needed turning from your back to your side while in a flat bed without using bedrails?: A Little ?Help needed moving from lying on your back to sitting on the side of a flat bed without using bedrails?: A Lot ?Help needed moving to and from a bed to a chair (including a wheelchair)?: A Lot ?Help needed standing up from a chair using your arms (e.g., wheelchair or bedside chair)?: A Lot ?Help needed to walk in hospital room?: Total ?Help needed climbing 3-5 steps with a railing? : Total ?6 Click Score: 11 ? ?  ?End of Session   ?Activity Tolerance: Patient tolerated treatment well ?Patient left: in bed;with call bell/phone within reach;with bed alarm set ?Nurse Communication: Mobility status ?PT Visit Diagnosis: Other abnormalities of gait and mobility (R26.89);Muscle weakness (generalized) (M62.81);Other symptoms and signs involving the nervous system (R29.898);Unsteadiness on feet (R26.81) ?  ? ? ?Time: 4098-1191 ?PT Time Calculation (min) (ACUTE ONLY): 28 min ? ?Charges:  $Therapeutic Activity: 8-22 mins ?$Neuromuscular Re-education: 8-22 mins          ?          ? ?09/04/2021 ? ?Jacinto Halim., PT ?Acute Rehabilitation Services ?[phone]  (pager) ?[phone]  (office) ? ? ?Eliseo Gum Kisean Rollo ?09/04/2021, 3:37 PM ? ?

## 2021-09-04 NOTE — Progress Notes (Signed)
? ?  Subjective ?Overnight events: None ? ?Patient doing well today. He reports that he has not had a BM in 2 days, and some discomfort from that but no further concerns or complaints at this time. ? ? ?Objective: ? ?Vital signs in last 24 hours: ?Vitals:  ? 09/03/21 1514 09/03/21 2008 09/03/21 2305 09/04/21 0302  ?BP: 131/68 137/65 129/77 129/83  ?Pulse: (!) 59 64 60 (!) 53  ?Resp: 16 18 18 18   ?Temp: 98 ?F (36.7 ?C) 97.7 ?F (36.5 ?C) (!) 97.5 ?F (36.4 ?C) 97.7 ?F (36.5 ?C)  ?TempSrc: Oral Oral Oral Oral  ?SpO2: 97% 96% 96% 97%  ?Weight:      ?Height:      ? ?General: well-appearing middle aged male, lying in bed, NAD. ?Pulm: normal work of breathing on RA. ?Neuro: AAOx3, mild residual L facial droop noted; dysarthria largely resolved. Strength in LUE and LLE remains 0/5. ?Skin: No skin breakdown on bilateral heels nor tenderness to palpation ?Psych: normal mood and affect. ? ? ?Assessment/Plan: ? ?Principal Problem: ?  Stroke [facility]) ?Active Problems: ?  Left-sided weakness ?  AKI (acute kidney injury) (HCC) ?  OSA (obstructive sleep apnea) ?  Adjustment disorder with mixed anxiety and depressed mood ?  Hypothyroid ? 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 No ongoing encephalopathy after baclofen dose reduction. Working with PT; left sided deficits persist. Remains stable for DC at this time, pending SNF placement. ?-Continue baclofen 10 mg BID and 40 mg qHS.  ?- continue cymbalta ?- ASA 81mg  daily, finished course of plavix ?- continue lipitor ?- PT/OT following  ?- Denied CIR; SW assisting with SNF placement; call for Medicaid today ?- Outpatient sleep study recommended  ? ?Acute L Shoulder pain, resolved ?Acute L arm pain, resolved ?L shoulder pain  described as tightness, relieved by stretching. L arm pain described as shooting, relieved by removing brace.  ?-- PT/OT following, appreciate recs ?-- GivMohr sling for L arm ?-- continue muscle rub cream PRN ? ?Constipation, resolved ?Indigestion, resolved ?Every other day BM on current bowel regimen. ?-maalox/mylanta ?-miralax daily ?-Continue senna-s 2 tablets twice daily ? ?Adjustment Disorder ?Patient with low mood and decreased motivation after losing independence post-stroke. Significantly improved mood and sleep with this regimen. ?-Continue Cymbalta 30 mg qd for depressive sx and neuropathic pain. ?-Per patient request and MAR, increase to trazodone 150 mg qhs along with 50mg  x1 prn for insomnia  ? ?HTN ?BP stable and well controlled. Patient developed AKI after initiation of HCTZ, so it was discontinued. ?- Continue Lisinopril 40 mg and amlodipine 10 mg ?- Continue Hydralazine 10 mg TID ? 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 Mental status at baseline. Repeat thyoid testing showing elevated TSH but normal free T4. ?- Continue PO synthroid Pearlean Brownie   ? ?Best Practice: ?Diet: Regular diet  ?IVF: None ?VTE: Lovenox 40 ?Code: Full ? ? , MD ?IMTS/Psych Resident  ?PGY-1 ?Pager: ?09/04/2021, 7:45 AM ?

## 2021-09-04 NOTE — Plan of Care (Addendum)
Patient does not need an outpatient sleep study. During this admission, on continuous pulse oximetry and no apneic episodes recorded. Patient not on CPAP prior to admission, and no need for CPAP during this admission.  ? ?Jason Sprinkles, MD ?Internal Medicine/ Psych Intern ?PGY-1 ?09/04/2021 2:37 PM ?Pager: [phone] ?After 5pm on weekdays and 1pm on weekends: On Call pager [phone]   ?

## 2021-09-04 NOTE — Plan of Care (Signed)
?  Problem: Coping: Goal: Will verbalize positive feelings about self Outcome: Progressing   Problem: Self-Care: Goal: Ability to participate in self-care as condition permits will improve Outcome: Progressing   Problem: Ischemic Stroke/TIA Tissue Perfusion: Goal: Complications of ischemic stroke/TIA will be minimized Outcome: Progressing   

## 2021-09-05 NOTE — Progress Notes (Signed)
? ?  Subjective ?Overnight events: None ? ?Patient doing well today. He reports that he had a BM last night, so he feels better today. He reports a tingling sensation in his left foot. ? ?No further concerns or complaints at this time. ? ? ?Objective: ? ?Vital signs in last 24 hours: ?Vitals:  ? 09/04/21 1804 09/04/21 1951 09/04/21 2320 09/05/21 0334  ?BP: 126/78 137/72 (!) 127/59 140/67  ?Pulse:  74 (!) 58 (!) 56  ?Resp:  18 18 18   ?Temp:  98.1 ?F (36.7 ?C) 97.9 ?F (36.6 ?C) (!) 97.4 ?F (36.3 ?C)  ?TempSrc:  Oral Oral Oral  ?SpO2:  97% 95% 98%  ?Weight:      ?Height:      ? ?General: well-appearing middle aged male, lying in bed, NAD. ?Pulm: normal work of breathing on RA. ?Neuro: AAOx3, mild residual L facial droop noted; dysarthria largely resolved. Strength in LUE and LLE remains 0/5. ?Psych: normal mood and affect. ? ? ?Assessment/Plan: ? ?Principal Problem: ?  Stroke [facility]) ?Active Problems: ?  Left-sided weakness ?  AKI (acute kidney injury) (HCC) ?  OSA (obstructive sleep apnea) ?  Adjustment disorder with mixed anxiety and depressed mood ?  Hypothyroid ? 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 No ongoing encephalopathy or pseudobulbar affect after baclofen dose reduction. Working with PT; left sided deficits persist but improving. Patient has been on continuous pulse oximetry throughout admission with no apneic events, so outpatient sleep study no longer needed, as previously recommended. Remains stable for DC at this time, pending SNF placement. ?-Continue baclofen 10 mg BID and 40 mg qHS.  ?- continue cymbalta ?- ASA 81mg  daily, finished course of plavix ?- continue lipitor ?- PT/OT following  ?- Denied CIR; SW assisting with SNF  placement ? ? ?Acute L Shoulder pain, resolved ?Acute L arm pain, resolved ?L shoulder pain described as tightness, relieved by stretching. L arm pain described as shooting, relieved by removing brace.  ?-- PT/OT following, appreciate recs ?-- GivMohr sling for L arm ?-- continue muscle rub cream PRN ? ?Constipation, resolved ?Indigestion, resolved ?Every other day BM on current bowel regimen. ?-maalox/mylanta ?-miralax daily ?-Continue senna-s 2 tablets twice daily ? ?Adjustment Disorder ?Patient with low mood and decreased motivation after losing independence post-stroke. Significantly improved mood and sleep with this regimen. ?-Continue Cymbalta 30 mg qd for depressive sx and neuropathic pain. ?-Continue Trazodone 150 mg qhs along with 50mg  x1 prn for insomnia  ? ?HTN ?BP stable and well controlled. Patient developed AKI after initiation of HCTZ, so it was discontinued. ?- Continue Lisinopril 40 mg and amlodipine 10 mg ?- Continue Hydralazine 10 mg TID ? 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 Mental status at baseline. Repeat thyoid testing showing elevated TSH but normal free T4. ?- Continue PO synthroid Pearlean Brownie   ? ?Best Practice: ?Diet: Regular diet  ?IVF: None ?VTE: Lovenox 40 ?Code: Full ? ? , MD ?IMTS/Psych Resident  ?PGY-1 ?Pager: ?09/05/2021, 6:40 AM ?

## 2021-09-06 DIAGNOSIS — R0789 Other chest pain: Secondary | ICD-10-CM

## 2021-09-06 LAB — BASIC METABOLIC PANEL
Anion gap: 10 (ref 5–15)
BUN: 21 mg/dL — ABNORMAL HIGH (ref 6–20)
CO2: 26 mmol/L (ref 22–32)
Calcium: 9.5 mg/dL (ref 8.9–10.3)
Chloride: 103 mmol/L (ref 98–111)
Creatinine, Ser: 0.9 mg/dL (ref 0.61–1.24)
GFR, Estimated: >60 mL/min (ref >60)
Glucose, Bld: 116 mg/dL — ABNORMAL HIGH (ref 70–99)
Potassium: 4 mmol/L (ref 3.5–5.1)
Sodium: 139 mmol/L (ref 135–145)

## 2021-09-06 LAB — TROPONIN I (HIGH SENSITIVITY)
Troponin I (High Sensitivity): 6 ng/L (ref <18)
Troponin I (High Sensitivity): 6 ng/L (ref <18)

## 2021-09-06 NOTE — Progress Notes (Signed)
? ?Subjective ?Overnight events: None ? ?Patient reports a mild, dull left-sided chest pain that radiates down his left arm, as well as pain in his left hand.  He denies dyspnea, lightheadedness, and dizziness but reports that this pain is different from previously reported musculoskeletal pain. ? ?No further concerns or complaints at this time. ? ? ?Objective: ? ?Vital signs in last 24 hours: ?Vitals:  ? 09/05/21 1507 09/05/21 1922 09/06/21 0029 09/06/21 0413  ?BP: 138/71 139/73 109/80 116/82  ?Pulse: 63 63 92 (!) 56  ?Resp: 20 17 15 15   ?Temp: 99.1 ?F (37.3 ?C) 99.1 ?F (37.3 ?C) 97.7 ?F (36.5 ?C) 97.6 ?F (36.4 ?C)  ?TempSrc: Oral Oral Axillary Axillary  ?SpO2: 95% 95% 95% 95%  ?Weight:      ?Height:      ? ?General: well-appearing middle aged male, lying in bed, NAD. ?Cardiac: Normal rate and rhythm.  No murmurs, rubs, gallops.  No TTP of chest wall. ?Pulm: normal work of breathing on RA. ?Neuro: AAOx3, mild residual L facial droop noted; dysarthria largely resolved. Strength in LUE and LLE remains 0/5. ?Psych: normal mood and affect. ? ? ?Assessment/Plan: ? ?Principal Problem: ?  Stroke [facility]) ?Active Problems: ?  Left-sided weakness ?  AKI (acute kidney injury) (HCC) ?  OSA (obstructive sleep apnea) ?  Adjustment disorder with mixed anxiety and depressed mood ?  Hypothyroid ? 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 No ongoing encephalopathy or pseudobulbar affect after baclofen dose reduction. Working with PT; left sided deficits persist but improving. Patient has been on continuous pulse oximetry throughout admission with no apneic events, so outpatient sleep study no longer needed, as previously recommended. Remains stable for DC at this time,  pending SNF placement. ?-Continue baclofen 10 mg BID and 40 mg qHS.  ?- continue cymbalta ?- ASA 81mg  daily, finished course of plavix ?- continue lipitor ?- PT/OT following  ?- Denied CIR; SW assisting with SNF placement ? ? ?Chest pain ?Patient with dull left-sided chest pain radiating down left arm.  No abnormal heart sounds auscultated.  Need to rule out ACS.  Troponins 6, BMP with no electrolyte abnormalities, and EKG with sinus bradycardia; no signs of ischemia.  Denies heartburn, hiccups, burps, or any other signs of GERD. ?- We will continue to monitor ?- Continue as needed Maalox/Mylanta for indigestion ?- Consider PPI or H2 blocker if no resolution of symptoms ? ?Acute L Shoulder pain, resolved ?Acute L arm pain, resolved ?L shoulder pain described as tightness, relieved by stretching. L arm pain described as shooting, relieved by removing brace.  ?-- PT/OT following, appreciate recs ?-- GivMohr sling for L arm ?-- continue muscle rub cream PRN ? ?Constipation, resolved ?Indigestion, resolved ?Every other day BM on current bowel regimen. ?-maalox/mylanta ?-miralax daily ?-Continue senna-s 2 tablets twice daily ? ?Adjustment Disorder ?Patient with low mood and decreased motivation after losing independence post-stroke. Significantly improved mood and sleep with this regimen. ?-Continue Cymbalta 30 mg qd for depressive sx and neuropathic pain. ?-Continue Trazodone 150 mg qhs along with 50mg  x1 prn for insomnia  ? ?HTN ?BP stable and well controlled. Patient developed AKI after initiation of HCTZ, so it was discontinued. ?- Continue Lisinopril 40 mg and amlodipine 10 mg ?- Continue Hydralazine 10  mg TID ? 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 Mental status at baseline. Repeat thyoid testing showing elevated TSH but normal free T4. ?- Continue PO synthroid   ? ?Best Practice: ?Diet: Regular diet  ?IVF:  None ?VTE: Lovenox 40 ?Code: Full ? ?Lamar Sprinkles, MD ?IMTS/Psych Resident  ?PGY-1 ?Pager: [phone] ?09/06/2021, 6:25 AM ?

## 2021-09-07 NOTE — Progress Notes (Signed)
? ?Subjective ?Overnight events: None ? ?Doing well, waiting on news for SNF placement. Very frustrated about how long the process is taking.  ? ?Chest pain has resolved. No arm or chest pain today. ? ?No further concerns or complaints at this time. ? ? ?Objective: ? ?Vital signs in last 24 hours: ?Vitals:  ? 09/06/21 1204 09/06/21 1338 09/06/21 1630 09/07/21 0512  ?BP: 128/74 135/66 120/76 113/73  ?Pulse: 64  69 (!) 51  ?Resp: 18  20 18   ?Temp: 98 ?F (36.7 ?C)  97.8 ?F (36.6 ?C) 97.9 ?F (36.6 ?C)  ?TempSrc: Oral  Oral Oral  ?SpO2: 97%  97% 96%  ?Weight:      ?Height:      ? ?General: well-appearing middle aged male, lying in bed, NAD. ?Pulm: normal work of breathing on RA. ?Neuro: AAOx3, mild residual L facial droop noted; dysarthria largely resolved. Strength in LUE and LLE remains 0/5. ?Psych: irritable mood and affect. ? ? ?Assessment/Plan: ? ?Principal Problem: ?  Stroke [facility]) ?Active Problems: ?  Left-sided weakness ?  AKI (acute kidney injury) (HCC) ?  OSA (obstructive sleep apnea) ?  Adjustment disorder with mixed anxiety and depressed mood ?  Hypothyroid ?  Other chest pain ? 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 No ongoing encephalopathy or pseudobulbar affect after baclofen dose reduction. Working with PT; left sided deficits persist but improving. Patient has been on continuous pulse oximetry throughout admission with no apneic events, so outpatient sleep study no longer needed, as previously recommended. Remains stable for DC at this time, pending SNF placement. ?-Continue baclofen 10 mg BID and 40 mg qHS.  ?- continue cymbalta ?- ASA 81mg  daily, finished course of plavix ?- continue lipitor ?- PT/OT following  ?- SW assisting with SNF  placement ? ? ?Chest pain, resolved ?Patient with dull left-sided chest pain radiating down left arm.  No abnormal heart sounds auscultated. He believes it may have been positional. Troponins 6, BMP with no electrolyte abnormalities, and EKG with sinus bradycardia; no signs of ischemia so ACS ruled out.  Denies heartburn, hiccups, burps, or any other signs of GERD. ?- We will continue to monitor ?- Continue as needed Maalox/Mylanta for indigestion ? ?Acute L Shoulder pain, resolved ?Acute L arm pain, resolved ?L shoulder pain described as tightness, relieved by stretching. L arm pain described as shooting, relieved by removing brace.  ?-- PT/OT following, appreciate recs ?-- GivMohr sling for L arm ?-- continue muscle rub cream PRN ? ?Constipation, resolved ?Indigestion, resolved ?Every other day BM on current bowel regimen. ?-maalox/mylanta ?-miralax daily ?-Continue senna-s 2 tablets twice daily ? ?Adjustment Disorder ?Patient with low mood and decreased motivation after losing independence post-stroke. Significantly improved mood and sleep with this regimen. ?-Continue Cymbalta 30 mg qd for depressive sx and neuropathic pain. ?-Continue Trazodone 150 mg qhs along with 50mg  x1 prn for insomnia  ? ?HTN ?BP stable and well controlled. Patient developed AKI after initiation of HCTZ, so it was discontinued. ?- Continue Lisinopril 40 mg and amlodipine 10 mg ?- Continue Hydralazine 10 mg TID ? 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 Mental status at baseline. Repeat thyoid testing showing elevated TSH  but normal free T4. ?- Continue PO synthroid 19mcg   ? ?Best Practice: ?Diet: Regular diet  ?IVF: None ?VTE: Lovenox 40 ?Code: Full ? ?Rosezetta Schlatter, MD ?IMTS/Psych Resident  ?PGY-1 ?Pager: [phone] ?09/07/2021, 6:34 AM ?

## 2021-09-07 NOTE — Progress Notes (Signed)
PT Cancellation Note ? ?Patient Details ?Name: Jason Figueroa ?MRN: [id number] ?DOB: 01/29/1966 ? ? ?Cancelled Treatment:    Reason Eval/Treat Not Completed: Patient declined, no reason specified. Pt was sleeping upon arrival, was awoken by PT. Informed pt that PT brought a w/c to mobilize in hallways. Pt politely declined stating, "I am so upset and mad today about not ever getting out of here. I can't focus today. I just want to sleep." Pt stated he would work with PT tomorrow. Acute PT to return as able to progress mobility. ? ?Lewis Shock, PT, DPT ?Acute Rehabilitation Services ?Pager #: [phone] ?Office #: [phone] ? ? ? ?Jason Figueroa ?09/07/2021, 2:31 PM ?

## 2021-09-08 NOTE — Progress Notes (Addendum)
Physical Therapy Treatment ?Patient Details ?Name: Jason Figueroa ?MRN: [id number] ?DOB: 11-Mar-1966 ?Today's Date: 09/08/2021 ? ? ?History of Present Illness 56 y.o. male presents to [facility] hospital on 07/25/2021 with numbness and tingling on left side of face along with L sided weakness. 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 PMH inlcudes HTN, previous stroke,  and recent TIA. ? ?  ?PT Comments  ? ? Pt making slow, steady progress. Continue to work with functional mobility to maximize pt's independence.  Continuing to pursue SNF.   ?Recommendations for follow up therapy are one component of a multi-disciplinary discharge planning process, led by the attending physician.  Recommendations may be updated based on patient status, additional functional criteria and insurance authorization. ? ?Follow Up Recommendations ? Skilled nursing-short term rehab (<3 hours/day) ?  ?  ?Assistance Recommended at Discharge Intermittent Supervision/Assistance  ?Patient can return home with the following Help with stairs or ramp for entrance;A lot of help with walking and/or transfers;Assist for transportation ?  ?Equipment Recommendations ? Wheelchair (measurements PT);Wheelchair cushion (measurements PT);BSC/3in1  ?  ?Recommendations for Other Services   ? ? ?  ?Precautions / Restrictions Precautions ?Precautions: Fall ?Precaution Comments: GivMohr sling for OOB, L resting hand splint for night time use ?Restrictions ?Weight Bearing Restrictions: No  ?  ? ?Mobility ? Bed Mobility ?Overal bed mobility: Needs Assistance ?Bed Mobility: Supine to Sit, Sit to Supine, Rolling ?Rolling: Min guard, Mod assist (min guard toward lt and mod assist toward rt) ?  ?Supine to sit: Min assist, HOB elevated ?Sit to supine: Min assist ?  ?General bed mobility comments: Incr time and effort. Only min assist to  stabilize trunk as he elevates into sitting. Assist to bring LLE back into bed. ?  ? ?Transfers ?Overall transfer level: Needs assistance ?Equipment used: None ?Transfers: Sit to/from Stand, Bed to chair/wheelchair/BSC ?Sit to Stand: Min assist ?Stand pivot transfers: Mod assist ?  ?  ?  ? Lateral/Scoot Transfers: Min assist ?General transfer comment: Bed to w/c using scoot transfer toward rt. Assist for balance and to manage LLE and stabilize w/c. Returning to bed pt performed stand pivot with pt coming to stand with min assist to bring hips up and pt pulling up on bed rail. Performed pivot with mod assist to stabilize LLE and for balance. ?  ? ?Ambulation/Gait ?  ?  ?  ?  ?  ?  ?  ?  ? ? ?Stairs ?  ?  ?  ?  ?  ? ? ?Wheelchair Mobility ?Wheelchair Mobility ?Wheelchair mobility: Yes ?Wheelchair propulsion: Right upper extremity, Right lower extremity ?Wheelchair parts: Needs assistance ?Distance: 80 ?Wheelchair Assistance Details (indicate cue type and reason): Assist to steer w/c as pt having difficulty getting much propulsion or steering out of RLE. Appeared w/c was too low to the groung to allow for most effective use of RLE. ? ?Modified Rankin (Stroke Patients Only) ?Modified Rankin (Stroke Patients Only) ?Pre-Morbid Rankin Score: No symptoms ?Modified Rankin: Severe disability ? ? ?  ?Balance Overall balance assessment: Needs assistance ?Sitting-balance support: Single extremity supported, Feet supported, No upper extremity supported ?Sitting balance-[name] Scale: Fair ?Sitting balance - Comments: Primarily fair but with occasional use of RUE to correct balance ?  ?Standing balance support: Single extremity supported, During functional activity ?Standing balance-[name] Scale: Poor ?Standing balance comment: Min assist and pt holding bedrail with RUE for static  standing ?  ?  ?  ?  ?  ?  ?  ?  ?  ?  ?  ?  ? ?  ?Cognition Arousal/Alertness: Awake/alert ?Behavior During Therapy: [facility] for tasks  assessed/performed ?Overall Cognitive Status: Impaired/Different from baseline ?Area of Impairment: Problem solving, Safety/judgement, Awareness ?  ?  ?  ?  ?  ?  ?  ?  ?  ?  ?  ?  ?Safety/Judgement: Decreased awareness of safety, Decreased awareness of deficits ?Awareness: Intellectual ?Problem Solving: Requires verbal cues ?  ?  ?  ? ?  ?Exercises General Exercises - Lower Extremity ?Hip ABduction/ADduction: 5 reps, Both, Seated (Isometric) ?Other Exercises ?Other Exercises: Bridging x 10 with support for LLE ?Other Exercises: Sitting trunk rotation 3 x 10 reps ?Other Exercises: Sitting trunk extension x 10 ?Other Exercises: Seated trunk crunches x 10 ?Other Exercises: Repeated roll toward rt facilitating use of LLE and trunk x 10 ? ?  ?General Comments   ?  ?  ? ?Pertinent Vitals/Pain Pain Assessment ?Pain Assessment: Faces ?Faces Pain Scale: Hurts a little bit ?Pain Location: lt knee ?Pain Descriptors / Indicators: Grimacing ?Pain Intervention(s): Limited activity within patient's tolerance  ? ? ?Home Living   ?  ?  ?  ?  ?  ?  ?  ?  ?  ?   ?  ?Prior Function    ?  ?  ?   ? ?PT Goals (current goals can now be found in the care plan section) Acute Rehab PT Goals ?PT Goal Formulation: With patient ?Time For Goal Achievement: 09/22/21 ?Potential to Achieve Goals: Fair ?Progress towards PT goals: Goals downgraded-see care plan ? ?  ?Frequency ? ? ? Min 3X/week ? ? ? ?  ?PT Plan Current plan remains appropriate  ? ? ?Co-evaluation   ?  ?  ?  ?  ? ?  ?AM-PAC PT "6 Clicks" Mobility   ?Outcome Measure ? Help needed turning from your back to your side while in a flat bed without using bedrails?: A Little ?Help needed moving from lying on your back to sitting on the side of a flat bed without using bedrails?: A Little ?Help needed moving to and from a bed to a chair (including a wheelchair)?: A Lot ?Help needed standing up from a chair using your arms (e.g., wheelchair or bedside chair)?: A Lot ?Help needed to walk in  hospital room?: Total ?Help needed climbing 3-5 steps with a railing? : Total ?6 Click Score: 12 ? ?  ?End of Session Equipment Utilized During Treatment: Gait belt ?Activity Tolerance: Patient tolerated treatment well ?Patient left: in bed;with call bell/phone within reach;with bed alarm set ?Nurse Communication: Mobility status ?PT Visit Diagnosis: Other abnormalities of gait and mobility (R26.89);Hemiplegia and hemiparesis ?Hemiplegia - Right/Left: Left ?Hemiplegia - dominant/non-dominant: Non-dominant ?Hemiplegia - caused by: Cerebral infarction ?  ? ? ?Time: 7494-4967 ?PT Time Calculation (min) (ACUTE ONLY): 40 min ? ?Charges:  $Therapeutic Activity: 23-37 mins ?$Wheel Chair Management: 8-22 mins          ?          ? ?[facility] PT ?Acute Rehabilitation Services ?Pager [phone] ?Office [phone] ? ? ? ?Angelina Ok [facility] ?09/08/2021, 3:39 PM ? ?

## 2021-09-08 NOTE — Progress Notes (Signed)
? ?Subjective ?Overnight events: None ? ?Doing well, resting to have energy to work with PT. He feels motivated to get in the wheelchair and go down the hall. Chest pain has resolved. Had 2 BMs yesterday. ? ?No further concerns or complaints at this time. ? ? ?Objective: ? ?Vital signs in last 24 hours: ?Vitals:  ? 09/07/21 1550 09/07/21 2023 09/08/21 0017 09/08/21 0403  ?BP:  130/71 112/73 128/69  ?Pulse:  63 65 66  ?Resp: 18 18 18 18   ?Temp:  98.3 ?F (36.8 ?C) 98.2 ?F (36.8 ?C) 98.3 ?F (36.8 ?C)  ?TempSrc:  Oral Oral Oral  ?SpO2: 97% 97% 98% 99%  ?Weight:      ?Height:      ? ?General: well-appearing middle aged male, lying in bed, NAD. ?Pulm: normal work of breathing on RA. ?Neuro: AAOx3, mild residual L facial droop noted; dysarthria largely resolved. Strength in LUE and LLE remains 0/5. ?Psych: normal mood and affect. ? ? ?Assessment/Plan: ? ?Principal Problem: ?  Stroke [facility]) ?Active Problems: ?  Left-sided weakness ?  AKI (acute kidney injury) (HCC) ?  OSA (obstructive sleep apnea) ?  Adjustment disorder with mixed anxiety and depressed mood ?  Hypothyroid ?  Other chest pain ? 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 No ongoing encephalopathy or pseudobulbar affect after baclofen dose reduction. Working with PT; left sided deficits persist but improving. Patient has been on continuous pulse oximetry throughout admission with no apneic events, so outpatient sleep study no longer needed, as previously recommended. Remains stable for DC at this time, pending SNF placement. ?-Continue baclofen 10 mg BID and 40 mg qHS.  ?- continue cymbalta ?- ASA 81mg  daily, finished course of plavix ?- continue lipitor ?- PT/OT following  ?- SW assisting with SNF  placement ? ? ?Chest pain, resolved ?Patient with dull left-sided chest pain radiating down left arm.  No abnormal heart sounds auscultated. He believes it may have been positional. Troponins 6, BMP with no electrolyte abnormalities, and EKG with sinus bradycardia; no signs of ischemia so ACS ruled out.  Denies heartburn, hiccups, burps, or any other signs of GERD. ?- We will continue to monitor ?- Continue as needed Maalox/Mylanta for indigestion ? ?Acute L Shoulder pain, resolved ?Acute L arm pain, resolved ?L shoulder pain described as tightness, relieved by stretching. L arm pain described as shooting, relieved by removing brace.  ?-- PT/OT following, appreciate recs ?-- GivMohr sling for L arm ?-- continue muscle rub cream PRN ? ?Constipation, resolved ?Indigestion, resolved ?Every other day BM on current bowel regimen. ?-maalox/mylanta ?-miralax daily ?-Continue senna-s 2 tablets twice daily ? ?Adjustment Disorder ?Patient with low mood and decreased motivation after losing independence post-stroke. Significantly improved mood and sleep with this regimen. ?-Continue Cymbalta 30 mg qd for depressive sx and neuropathic pain. ?-Continue Trazodone 150 mg qhs along with 50mg  x1 prn for insomnia  ? ?HTN ?BP stable and well controlled. Patient developed AKI after initiation of HCTZ, so it was discontinued. ?- Continue Lisinopril 40 mg and amlodipine 10 mg ?- Continue Hydralazine 10 mg TID ? 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 Mental status at baseline. Repeat thyoid testing showing elevated  TSH but normal free T4. ?- Continue PO synthroid   ? ?Best Practice: ?Diet: Regular diet  ?IVF: None ?VTE: Lovenox 40 ?Code: Full ? ?Lamar Sprinkles, MD ?IMTS/Psych Resident  ?PGY-1 ?Pager: [phone] ?09/08/2021, 7:03 AM ?

## 2021-09-08 NOTE — Progress Notes (Signed)
Occupational Therapy Treatment ?Patient Details ?Name: Jason SaferCharles K Pons ?MRN: [id number] ?DOB: Dec 15, 1965 ?Today's Date: 09/08/2021 ? ? ?History of present illness 56 y.o. male [facility] hospital on 07/25/2021 with numbness and tingling on left side of face along with L sided weakness. 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 PMH inlcudes HTN, previous stroke,  and recent TIA. ?  ?OT comments ? Patient received in supine and agreeable to OT session. Patient was min assist to get to EOB with use of rail and HOB up.  Patient was stand pivot transfer from EOB to recliner and performed 3 stands from recliner at sink with mod assist to stand and min assist for balance while wearing sling no LUE.  Patient asked to return to bed following treatment stating recliner was too uncomfortable and he plans to get up again with PT. Acute OT to continue to follow.   ? ?Recommendations for follow up therapy are one component of a multi-disciplinary discharge planning process, led by the attending physician.  Recommendations may be updated based on patient status, additional functional criteria and insurance authorization. ?   ?Follow Up Recommendations ? Skilled nursing-short term rehab (<3 hours/day)  ?  ?Assistance Recommended at Discharge Frequent or constant Supervision/Assistance  ?Patient can return home with the following ? A lot of help with walking and/or transfers;A lot of help with bathing/dressing/bathroom;Assist for transportation;Direct supervision/assist for financial management;Direct supervision/assist for medications management;Assistance with cooking/housework;Help with stairs or ramp for entrance ?  ?Equipment Recommendations ? BSC/3in1;Wheelchair cushion (measurements OT);Wheelchair (measurements OT);Tub/shower bench  ?  ?Recommendations for Other Services   ? ?   ?Precautions / Restrictions Precautions ?Precautions: Fall ?Precaution Comments: GivMohr sling for OOB, L resting hand splint for night time use ?Restrictions ?Weight Bearing Restrictions: No  ? ? ?  ? ?Mobility Bed Mobility ?Overal bed mobility: Needs Assistance ?Bed Mobility: Supine to Sit, Sit to Supine ?  ?  ?Supine to sit: Min assist, HOB elevated ?Sit to supine: Mod assist ?  ?General bed mobility comments: Patient attempts to perform bed mobiltiy without assistance but unable to complete with assistance with trunk to get to EOB and assistance with LEs for getting back to supine ?  ? ?Transfers ?Overall transfer level: Needs assistance ?Equipment used: None ?Transfers: Sit to/from Stand ?Sit to Stand: Mod assist, Min assist ?Stand pivot transfers: Mod assist ?  ?  ?  ?  ?General transfer comment: stood from EOB to transfer to recliner.  Stood x3 at sink with mod assist to stand and min assist for balance ?  ?  ?Balance Overall balance assessment: Needs assistance ?Sitting-balance support: Single extremity supported, Feet supported ?Sitting balance-[name] Scale: Fair ?  ?Postural control: Right lateral lean, Left lateral lean ?Standing balance support: Single extremity supported, During functional activity ?Standing balance-[name] Scale: Poor ?Standing balance comment: Stood at sink to attempt grooming at sink.  Performed 3 stands with mod assist to stand from recliner and used sink for support with min assist for balance ?  ?  ?  ?  ?  ?  ?  ?  ?  ?  ?  ?   ? ?ADL either performed or assessed with clinical judgement  ? ?ADL Overall ADL's : Needs assistance/impaired ?  ?  ?Grooming: Therapist, nutritionalWash/dry face;Supervision/safety;Sitting ?  ?  ?  ?  ?  ?  ?  ?  ?  ?  ?  ?  ?  ?  ?  ?  ?  General ADL Comments: attempted grooming standing at sink but patient was unable to use RUE while standing ?  ? ?Extremity/Trunk Assessment Upper Extremity Assessment ?LUE Deficits / Details: hemiplegic, hemiparesis. no trace activition  noted ?LUE Sensation: decreased light touch;decreased proprioception ?LUE Coordination: decreased fine motor;decreased gross motor ?  ?  ?  ?  ?  ? ?Vision   ?  ?  ?Perception   ?  ?Praxis   ?  ? ?Cognition Arousal/Alertness: Awake/alert ?Behavior During Therapy: [facility] for tasks assessed/performed ?Overall Cognitive Status: Impaired/Different from baseline ?Area of Impairment: Problem solving ?  ?  ?  ?  ?  ?  ?  ?  ?  ?Current Attention Level: Selective ?  ?Following Commands: Follows one step commands consistently ?Safety/Judgement: Decreased awareness of safety, Decreased awareness of deficits ?Awareness: Intellectual ?Problem Solving: Requires verbal cues ?  ?  ?  ?   ?Exercises   ? ?  ?Shoulder Instructions   ? ? ?  ?General Comments    ? ? ?Pertinent Vitals/ Pain       Pain Assessment ?Pain Assessment: Faces ?Faces Pain Scale: Hurts little more ?Pain Location: L Knee and left hand ?Pain Descriptors / Indicators: Grimacing, Sharp, Sore ?Pain Intervention(s): Limited activity within patient's tolerance, Monitored during session, Repositioned ? ?Home Living   ?  ?  ?  ?  ?  ?  ?  ?  ?  ?  ?  ?  ?  ?  ?  ?  ?  ?  ? ?  ?Prior Functioning/Environment    ?  ?  ?  ?   ? ?Frequency ? Min 2X/week  ? ? ? ? ?  ?Progress Toward Goals ? ?OT Goals(current goals can now be found in the care plan section) ? Progress towards OT goals: Progressing toward goals ? ?Acute Rehab OT Goals ?Patient Stated Goal: get stronger ?OT Goal Formulation: With patient ?Time For Goal Achievement: 09/10/21 ?Potential to Achieve Goals: Good ?ADL Goals ?Pt Will Perform Grooming: with supervision;sitting ?Pt Will Perform Upper Body Dressing: with set-up;sitting ?Pt Will Perform Lower Body Dressing: with min guard assist;sit to/from stand;sitting/lateral leans ?Pt Will Transfer to Toilet: with min guard assist;bedside commode;squat pivot transfer ?Additional ADL Goal #1: PT will complete bed mobility with S in preparation for ADL tasks ?Additional ADL  Goal #2: Pt will independently complete self ROM  to maintain LUE [facility] for ADL tasks ?Additional ADL Goal #3: Pt will tolerate L resting hadn splint during night time hours without complications to protect L hand from injust and reduce risk of contracture development  ?Plan Discharge plan remains appropriate;Frequency remains appropriate   ? ?Co-evaluation ? ? ?   ?  ?  ?  ?  ? ?  ?AM-PAC OT "6 Clicks" Daily Activity     ?Outcome Measure ? ? Help from another person eating meals?: A Little ?Help from another person taking care of personal grooming?: A Little ?Help from another person toileting, which includes using toliet, bedpan, or urinal?: A Lot ?Help from another person bathing (including washing, rinsing, drying)?: A Lot ?Help from another person to put on and taking off regular upper body clothing?: A Lot ?Help from another person to put on and taking off regular lower body clothing?: A Lot ?6 Click Score: 14 ? ?  ?End of Session Equipment Utilized During Treatment: Gait belt;Other (comment) (givmohr sling) ? ?OT Visit Diagnosis: Unsteadiness on feet (R26.81);Other abnormalities of gait and mobility (R26.89);Muscle weakness (generalized) (M62.81);Pain;Hemiplegia and  hemiparesis;Feeding difficulties (R63.3) ?Hemiplegia - Right/Left: Left ?Hemiplegia - dominant/non-dominant: Non-Dominant ?Hemiplegia - caused by: Cerebral infarction ?Pain - Right/Left: Left ?Pain - part of body: Arm ?  ?Activity Tolerance Patient tolerated treatment well ?  ?Patient Left in bed;with call bell/phone within reach;with bed alarm set ?  ?Nurse Communication Mobility status ?  ? ?   ? ?Time: 5400-8676 ?OT Time Calculation (min): 30 min ? ?Charges: OT General Charges ?$OT Visit: 1 Visit ?OT Treatments ?$Therapeutic Activity: 23-37 mins ? ?Alfonse Flavors, OTA ?Acute Rehabilitation Services  ?Pager [phone] ?Office [phone] ? ? ?[place] Jeannett Senior ?09/08/2021, 12:57 PM ?

## 2021-09-09 NOTE — Progress Notes (Signed)
? ?Subjective ?Overnight events: None ? ?Doing well today with no pain. Has been voiding appropriately. Denies muscle spasms with brace.  ? ?No further concerns or complaints at this time. ? ? ?Objective: ? ?Vital signs in last 24 hours: ?Vitals:  ? 09/08/21 1548 09/08/21 2001 09/08/21 2005 09/09/21 0914  ?BP: 136/73 (!) 150/77 (!) 150/77 126/69  ?Pulse: 66  63 (!) 59  ?Resp: 18  18 18   ?Temp: 98.5 ?F (36.9 ?C)  99.1 ?F (37.3 ?C) 98 ?F (36.7 ?C)  ?TempSrc: Oral  Oral Oral  ?SpO2: 97%  96% 97%  ?Weight:      ?Height:      ? ?General: well-appearing middle aged male, lying in bed, NAD. ?Pulm: normal work of breathing on RA. ?Neuro: AAOx3, mild residual L facial droop noted; dysarthria largely resolved. Strength in LUE and LLE remains 0/5. ?MSK: brace in place on L knee. ?Skin: Warm and dry; Normal skin turgor ?Psych: normal mood and affect. ? ? ?Assessment/Plan: ? ?Principal Problem: ?  Stroke [facility]) ?Active Problems: ?  Left-sided weakness ?  AKI (acute kidney injury) (HCC) ?  OSA (obstructive sleep apnea) ?  Adjustment disorder with mixed anxiety and depressed mood ?  Hypothyroid ?  Other chest pain ? 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 No ongoing encephalopathy or pseudobulbar affect after baclofen dose reduction. Working with PT; left sided deficits persist but improving. Patient has been on continuous pulse oximetry throughout admission with no apneic events, so outpatient sleep study no longer needed, as previously recommended. Remains stable for DC at this time, pending SNF placement. ?-Continue baclofen 10 mg BID and 40 mg qHS.  ?- continue cymbalta ?- ASA 81mg  daily, finished course of plavix ?- continue lipitor ?- PT/OT following  ?- SW  assisting with SNF placement ? ? ?Chest pain, resolved ?Patient with dull left-sided chest pain radiating down left arm.  No abnormal heart sounds auscultated. He believes it may have been positional. Troponins 6, BMP with no electrolyte abnormalities, and EKG with sinus bradycardia; no signs of ischemia so ACS ruled out.  Denies heartburn, hiccups, burps, or any other signs of GERD. ?- We will continue to monitor ?- Continue as needed Maalox/Mylanta for indigestion ? ?Acute L Shoulder pain, resolved ?Acute L arm pain, resolved ?L shoulder pain described as tightness, relieved by stretching. L arm pain described as shooting, relieved by removing brace.  ?-- PT/OT following, appreciate recs ?-- GivMohr sling for L arm ?-- continue muscle rub cream PRN ? ?Constipation, resolved ?Indigestion, resolved ?Every other day BM on current bowel regimen. ?-maalox/mylanta ?-miralax daily ?-Continue senna-s 2 tablets twice daily ? ?Adjustment Disorder ?Patient with low mood and decreased motivation after losing independence post-stroke. Significantly improved mood and sleep with this regimen. ?-Continue Cymbalta 30 mg qd for depressive sx and neuropathic pain. ?-Continue Trazodone 150 mg qhs along with 50mg  x1 prn for insomnia  ? ?HTN ?BP stable and well controlled. Patient developed AKI after initiation of HCTZ, so it was discontinued. ?- Continue Lisinopril 40 mg and amlodipine 10 mg ?- Continue Hydralazine 10 mg TID ? 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 Mental status at baseline. Repeat thyoid testing  showing elevated TSH but normal free T4. ?- Continue PO synthroid   ? ?Best Practice: ?Diet: Regular diet  ?IVF: None ?VTE: Lovenox 40 ?Code: Full ? ?Lamar Sprinkles, MD ?IMTS/Psych Resident  ?PGY-1 ?Pager: [phone] ?09/09/2021, 10:19 AM ?

## 2021-09-10 NOTE — Plan of Care (Signed)
?  Problem: Education: ?Goal: Knowledge of disease or condition will improve ?Outcome: Progressing ?Goal: Knowledge of secondary prevention will improve (SELECT ALL) ?Outcome: Progressing ?Goal: Knowledge of patient specific risk factors will improve (INDIVIDUALIZE FOR PATIENT) ?Outcome: Progressing ?Goal: Individualized Educational Video(s) ?Outcome: Progressing ?  ?Problem: Coping: ?Goal: Will verbalize positive feelings about self ?Outcome: Progressing ?Goal: Will identify appropriate support needs ?Outcome: Progressing ?  ?Problem: Health Behavior/Discharge Planning: ?Goal: Ability to manage health-related needs will improve ?Outcome: Progressing ?  ?Problem: Self-Care: ?Goal: Ability to participate in self-care as condition permits will improve ?Outcome: Progressing ?Goal: Verbalization of feelings and concerns over difficulty with self-care will improve ?Outcome: Progressing ?Goal: Ability to communicate needs accurately will improve ?Outcome: Progressing ?  ?Problem: Nutrition: ?Goal: Risk of aspiration will decrease ?Outcome: Progressing ?  ?Problem: Ischemic Stroke/TIA Tissue Perfusion: ?Goal: Complications of ischemic stroke/TIA will be minimized ?Outcome: Progressing ?  ?Problem: Education: ?Goal: Knowledge of General Education information will improve ?Description: Including pain rating scale, medication(s)/side effects and non-pharmacologic comfort measures ?Outcome: Progressing ?  ?Problem: Health Behavior/Discharge Planning: ?Goal: Ability to manage health-related needs will improve ?Outcome: Progressing ?  ?Problem: Clinical Measurements: ?Goal: Ability to maintain clinical measurements within normal limits will improve ?Outcome: Progressing ?Goal: Will remain free from infection ?Outcome: Progressing ?Goal: Diagnostic test results will improve ?Outcome: Progressing ?Goal: Respiratory complications will improve ?Outcome: Progressing ?Goal: Cardiovascular complication will be avoided ?Outcome:  Progressing ?  ?Problem: Activity: ?Goal: Risk for activity intolerance will decrease ?Outcome: Progressing ?  ?Problem: Coping: ?Goal: Level of anxiety will decrease ?Outcome: Progressing ?  ?Problem: Elimination: ?Goal: Will not experience complications related to bowel motility ?Outcome: Progressing ?Goal: Will not experience complications related to urinary retention ?Outcome: Progressing ?  ?Problem: Pain Managment: ?Goal: General experience of comfort will improve ?Outcome: Progressing ?  ?Problem: Safety: ?Goal: Ability to remain free from injury will improve ?Outcome: Progressing ?  ?Problem: Skin Integrity: ?Goal: Risk for impaired skin integrity will decrease ?Outcome: Progressing ?  ?

## 2021-09-10 NOTE — Progress Notes (Signed)
Occupational Therapy Treatment ?Patient Details ?Name: Jason Figueroa ?MRN: [id number] ?DOB: 03/25/1966 ?Today's Date: 09/10/2021 ? ? ?History of present illness 56 y.o. male presents to [facility] hospital on 07/25/2021 with numbness and tingling on left side of face along with L sided weakness. 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 PMH inlcudes HTN, previous stroke,  and recent TIA. ?  ?OT comments ? Patient received in supine with family present.  Patient was min assist to get to EOB from supine with attempting without assistance but required assistance with trunk.  Patient sat on EOB with increased time due to difficulty with sitting balance inially before attempting transfer to recliner. PROM performed to LUE shoulder and hand and SROM HEP performed with verbal cues to follow. Acute OT to continue to follow.   ? ?Recommendations for follow up therapy are one component of a multi-disciplinary discharge planning process, led by the attending physician.  Recommendations may be updated based on patient status, additional functional criteria and insurance authorization. ?   ?Follow Up Recommendations ? Skilled nursing-short term rehab (<3 hours/day)  ?  ?Assistance Recommended at Discharge Frequent or constant Supervision/Assistance  ?Patient can return home with the following ? A lot of help with walking and/or transfers;A lot of help with bathing/dressing/bathroom;Assist for transportation;Direct supervision/assist for financial management;Direct supervision/assist for medications management;Assistance with cooking/housework;Help with stairs or ramp for entrance ?  ?Equipment Recommendations ? BSC/3in1;Wheelchair cushion (measurements OT);Wheelchair (measurements OT);Tub/shower bench  ?  ?Recommendations for Other Services   ? ?  ?Precautions / Restrictions  Precautions ?Precautions: Fall ?Precaution Comments: GivMohr sling for OOB, L resting hand splint for night time use ?Restrictions ?Weight Bearing Restrictions: No  ? ? ?  ? ?Mobility Bed Mobility ?Overal bed mobility: Needs Assistance ?Bed Mobility: Supine to Sit ?  ?  ?Supine to sit: Min assist, HOB elevated ?  ?  ?General bed mobility comments: increased time and min assist to get to EOB with patient using rail to assist ?  ? ?Transfers ?Overall transfer level: Needs assistance ?Equipment used: None ?Transfers: Sit to/from Stand, Bed to chair/wheelchair/BSC ?Sit to Stand: Min assist ?Stand pivot transfers: Mod assist ?  ?  ?  ?  ?General transfer comment: bed to recliner transfer with chair positioned on right ?  ?  ?Balance Overall balance assessment: Needs assistance ?Sitting-balance support: Single extremity supported, Feet supported ?Sitting balance-[name] Scale: Fair ?Sitting balance - Comments: reliant on RUE support for sitting balance due to posterior leaning ?Postural control: Posterior lean ?Standing balance support: Single extremity supported, During functional activity ?Standing balance-[name] Scale: Poor ?Standing balance comment: stood for transfer to recliner ?  ?  ?  ?  ?  ?  ?  ?  ?  ?  ?  ?   ? ?ADL either performed or assessed with clinical judgement  ? ?ADL   ?  ?  ?  ?  ?  ?  ?  ?  ?  ?  ?  ?  ?  ?  ?  ?  ?  ?  ?  ?  ?  ? ?Extremity/Trunk Assessment Upper Extremity Assessment ?LUE Deficits / Details: hemiplegic, hemiparesis. no trace activition noted ?LUE Sensation: decreased light touch;decreased proprioception ?LUE Coordination: decreased fine motor;decreased gross motor ?  ?  ?  ?  ?  ? ?Vision   ?  ?  ?Perception   ?  ?  Praxis   ?  ? ?Cognition Arousal/Alertness: Awake/alert ?Behavior During Therapy: [facility] for tasks assessed/performed ?Overall Cognitive Status: Impaired/Different from baseline ?Area of Impairment: Problem solving, Safety/judgement, Awareness ?  ?  ?  ?  ?  ?  ?  ?  ?  ?Current  Attention Level: Selective ?  ?Following Commands: Follows one step commands consistently ?Safety/Judgement: Decreased awareness of safety, Decreased awareness of deficits ?Awareness: Intellectual ?Problem Solving: Requires verbal cues ?General Comments: required verbal cues for encourgement to get out of bed ?  ?  ?   ?Exercises Exercises: General Upper Extremity ?General Exercises - Upper Extremity ?Shoulder Flexion: PROM, Self ROM, Left, 10 reps, Seated ?Shoulder Extension: PROM, Self ROM, Left, 10 reps, Seated ?Shoulder ABduction: Self ROM, Left, 10 reps, Seated ?Shoulder ADduction: Self ROM, Left, 10 reps, Seated ?Shoulder Horizontal ABduction: Self ROM, Left, 10 reps, Seated ?Elbow Flexion: Self ROM, Left, 10 reps, Seated ?Elbow Extension: Self ROM, Left, 10 reps, Seated ?Wrist Flexion: AAROM, Left, 10 reps, Seated ?Wrist Extension: Self ROM, Left, 10 reps, Seated ?Digit Composite Flexion: PROM, 10 reps, Left, Seated ?Composite Extension: PROM, Left, 10 reps, Seated ? ?  ?Shoulder Instructions   ? ? ?  ?General Comments    ? ? ?Pertinent Vitals/ Pain       Pain Assessment ?Pain Assessment: Faces ?Faces Pain Scale: Hurts a little bit ?Pain Location: LUE hand with ROM ?Pain Descriptors / Indicators: Grimacing ?Pain Intervention(s): Limited activity within patient's tolerance, Monitored during session ? ?Home Living   ?  ?  ?  ?  ?  ?  ?  ?  ?  ?  ?  ?  ?  ?  ?  ?  ?  ?  ? ?  ?Prior Functioning/Environment    ?  ?  ?  ?   ? ?Frequency ? Min 2X/week  ? ? ? ? ?  ?Progress Toward Goals ? ?OT Goals(current goals can now be found in the care plan section) ? Progress towards OT goals: Progressing toward goals ? ?Acute Rehab OT Goals ?Patient Stated Goal: get better ?OT Goal Formulation: With patient ?Time For Goal Achievement: 09/18/21 ?Potential to Achieve Goals: Good ?ADL Goals ?Pt Will Perform Grooming: with supervision;sitting ?Pt Will Perform Upper Body Dressing: with set-up;sitting ?Pt Will Perform Lower Body  Dressing: with min guard assist;sit to/from stand;sitting/lateral leans ?Pt Will Transfer to Toilet: with min guard assist;bedside commode;squat pivot transfer ?Additional ADL Goal #1: PT will complete bed mobility with S in preparation for ADL tasks ?Additional ADL Goal #2: Pt will independently complete self ROM  to maintain LUE [facility] for ADL tasks ?Additional ADL Goal #3: Pt will tolerate L resting hadn splint during night time hours without complications to protect L hand from injust and reduce risk of contracture development  ?Plan Discharge plan remains appropriate;Frequency remains appropriate   ? ?Co-evaluation ? ? ?   ?  ?  ?  ?  ? ?  ?AM-PAC OT "6 Clicks" Daily Activity     ?Outcome Measure ? ? Help from another person eating meals?: A Little ?Help from another person taking care of personal grooming?: A Little ?Help from another person toileting, which includes using toliet, bedpan, or urinal?: A Lot ?Help from another person bathing (including washing, rinsing, drying)?: A Lot ?Help from another person to put on and taking off regular upper body clothing?: A Lot ?Help from another person to put on and taking off regular lower body clothing?: A Lot ?6 Click Score:  14 ? ?  ?End of Session Equipment Utilized During Treatment: Gait belt ? ?OT Visit Diagnosis: Unsteadiness on feet (R26.81);Other abnormalities of gait and mobility (R26.89);Muscle weakness (generalized) (M62.81);Pain;Hemiplegia and hemiparesis;Feeding difficulties (R63.3) ?Hemiplegia - Right/Left: Left ?Hemiplegia - dominant/non-dominant: Non-Dominant ?Hemiplegia - caused by: Cerebral infarction ?Pain - Right/Left: Left ?Pain - part of body: Arm ?  ?Activity Tolerance Patient tolerated treatment well ?  ?Patient Left in chair;with call bell/phone within reach;with chair alarm set;with family/visitor present ?  ?Nurse Communication Mobility status ?  ? ?   ? ?Time: 4709-6283 ?OT Time Calculation (min): 23 min ? ?Charges: OT General Charges ?$OT  Visit: 1 Visit ?OT Treatments ?$Therapeutic Activity: 8-22 mins ?$Therapeutic Exercise: 8-22 mins ? ?Alfonse Flavors, OTA ?Acute Rehabilitation Services  ?Pager [phone] ?Office [phone] ? ? ?Tranise Forrest Jeannett Senior ?09/10/2021, 12:37

## 2021-09-10 NOTE — TOC Progression Note (Signed)
Transition of Care (TOC) - Progression Note  ? ? ?Patient Details  ?Name: Jason Figueroa ?MRN: [id number] ?Date of Birth: 26-Sep-1965 ? ?Transition of Care (TOC) CM/SW Contact  ?Geralynn Ochs, LCSW ?Phone Number: ?09/10/2021, 12:49 PM ? ?Clinical Narrative:   CSW received bed offer for patient at [facility]. CSW met with patient and mother at bedside to provide update, both are appreciative. Bed will be available tomorrow. CSW completed LOG paperwork to be sent to SNF. CSW to follow for discharge to SNF tomorrow. ? ? ? ?Expected Discharge Plan: [place] ?Barriers to Discharge: Inadequate or no insurance, SNF Pending bed offer, SNF Pending payor source - LOG, SNF Pending Medicaid ? ?Expected Discharge Plan and Services ?Expected Discharge Plan: [place] ?  ?  ?Post Acute Care Choice: [place] ?Living arrangements for the past 2 months: [place] ?                ?  ?  ?  ?  ?  ?  ?  ?  ?  ?  ? ? ?Social Determinants of Health (SDOH) Interventions ?  ? ?Readmission Risk Interventions ?   ? View : No data to display.  ?  ?  ?  ? ? ?

## 2021-09-10 NOTE — Progress Notes (Signed)
? ?Subjective ?Overnight events: None ? ?Doing well today sitting up in recliner. Informed that he has a SNF bed for tomorrow close to his mom's house, with which he was happy.  ? ?No further concerns or complaints at this time. ? ? ?Objective: ? ?Vital signs in last 24 hours: ?Vitals:  ? 09/09/21 1159 09/09/21 1505 09/09/21 2331 09/10/21 0339  ?BP: 135/68 (!) 112/94 123/71 140/87  ?Pulse: (!) 59 62 (!) 58 (!) 58  ?Resp: 16 14 16 16   ?Temp: 98.4 ?F (36.9 ?C) 98.3 ?F (36.8 ?C) 99.2 ?F (37.3 ?C) 97.9 ?F (36.6 ?C)  ?TempSrc: Oral Oral Oral Oral  ?SpO2: 95% 98% 96% 96%  ?Weight:      ?Height:      ? ?General: well-appearing middle aged male, sitting in recliner, in NAD. ?Pulm: normal work of breathing on RA. ?Neuro: AAOx3, mild residual L facial droop noted; dysarthria largely resolved. Strength in LUE and LLE remains 0/5. ?MSK: brace in place on L knee. ?Psych: normal mood and affect. ? ? ?Assessment/Plan: ? ?Principal Problem: ?  Stroke [facility]) ?Active Problems: ?  Left-sided weakness ?  AKI (acute kidney injury) (HCC) ?  OSA (obstructive sleep apnea) ?  Adjustment disorder with mixed anxiety and depressed mood ?  Hypothyroid ?  Other chest pain ? 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 No ongoing encephalopathy or pseudobulbar affect after baclofen dose reduction. Working with PT; left sided deficits persist but improving. Patient has been on continuous pulse oximetry throughout admission with no apneic events, so outpatient sleep study no longer needed, as previously recommended. Remains stable for DC at this time, pending SNF placement.  Patient accepted for SNF at [facility] for tomorrow.  ?-Continue baclofen 10 mg BID and 40 mg qHS.   ?- continue cymbalta ?- ASA 81mg  daily, finished course of plavix ?- continue lipitor ?- PT/OT following  ?- SW assisting with SNF placement ? ? ?Chest pain, resolved ?Patient with dull left-sided chest pain radiating down left arm.  No abnormal heart sounds auscultated. He believes it may have been positional. Troponins 6, BMP with no electrolyte abnormalities, and EKG with sinus bradycardia; no signs of ischemia so ACS ruled out.  Denies heartburn, hiccups, burps, or any other signs of GERD. ?- We will continue to monitor ?- Continue as needed Maalox/Mylanta for indigestion ? ?Acute L Shoulder pain, resolved ?Acute L arm pain, resolved ?L shoulder pain described as tightness, relieved by stretching. L arm pain described as shooting, relieved by removing brace.  ?-- PT/OT following, appreciate recs ?-- GivMohr sling for L arm ?-- continue muscle rub cream PRN ? ?Constipation, resolved ?Indigestion, resolved ?Every other day BM on current bowel regimen. ?-maalox/mylanta ?-miralax daily ?-Continue senna-s 2 tablets twice daily ? ?Adjustment Disorder ?Patient with low mood and decreased motivation after losing independence post-stroke. Significantly improved mood and sleep with this regimen. ?-Continue Cymbalta 30 mg qd for depressive sx and neuropathic pain. ?-Continue Trazodone 150 mg qhs along with 50mg  x1 prn for insomnia  ? ?HTN ?BP stable and well controlled. Patient developed AKI after initiation of HCTZ, so it was discontinued. ?- Continue Lisinopril 40 mg and amlodipine 10 mg ?- Continue Hydralazine 10 mg TID ? 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 Mental status at baseline. Repeat thyoid testing showing elevated TSH but normal free T4. ?- Continue PO synthroid   ? ?Best Practice: ?Diet: Regular diet  ?IVF: None ?VTE: Lovenox 40 ?Code: Full ? ?Lamar Sprinkles, MD ?IMTS/Psych Resident  ?PGY-1 ?Pager:  [phone] ?09/10/2021, 7:30 AM ?

## 2021-09-10 NOTE — Plan of Care (Signed)
Pt is alert oriented x 4. Pt denies numbness or tingling. Pt request door be closed. Pt has condom cath in place with good urine out put.  No distress noted.  ? ? ?Problem: Education: ?Goal: Knowledge of disease or condition will improve ?Outcome: Progressing ?Goal: Knowledge of secondary prevention will improve (SELECT ALL) ?Outcome: Progressing ?Goal: Knowledge of patient specific risk factors will improve (INDIVIDUALIZE FOR PATIENT) ?Outcome: Progressing ?Goal: Individualized Educational Video(s) ?Outcome: Progressing ?  ?Problem: Coping: ?Goal: Will verbalize positive feelings about self ?Outcome: Progressing ?Goal: Will identify appropriate support needs ?Outcome: Progressing ?  ?Problem: Health Behavior/Discharge Planning: ?Goal: Ability to manage health-related needs will improve ?Outcome: Progressing ?  ?Problem: Self-Care: ?Goal: Ability to participate in self-care as condition permits will improve ?Outcome: Progressing ?Goal: Verbalization of feelings and concerns over difficulty with self-care will improve ?Outcome: Progressing ?Goal: Ability to communicate needs accurately will improve ?Outcome: Progressing ?  ?Problem: Nutrition: ?Goal: Risk of aspiration will decrease ?Outcome: Progressing ?  ?Problem: Ischemic Stroke/TIA Tissue Perfusion: ?Goal: Complications of ischemic stroke/TIA will be minimized ?Outcome: Progressing ?  ?Problem: Education: ?Goal: Knowledge of General Education information will improve ?Description: Including pain rating scale, medication(s)/side effects and non-pharmacologic comfort measures ?Outcome: Progressing ?  ?Problem: Health Behavior/Discharge Planning: ?Goal: Ability to manage health-related needs will improve ?Outcome: Progressing ?  ?Problem: Clinical Measurements: ?Goal: Ability to maintain clinical measurements within normal limits will improve ?Outcome: Progressing ?Goal: Will remain free from infection ?Outcome: Progressing ?Goal: Diagnostic test results will  improve ?Outcome: Progressing ?Goal: Respiratory complications will improve ?Outcome: Progressing ?Goal: Cardiovascular complication will be avoided ?Outcome: Progressing ?  ?Problem: Activity: ?Goal: Risk for activity intolerance will decrease ?Outcome: Progressing ?  ?Problem: Coping: ?Goal: Level of anxiety will decrease ?Outcome: Progressing ?  ?Problem: Elimination: ?Goal: Will not experience complications related to bowel motility ?Outcome: Progressing ?Goal: Will not experience complications related to urinary retention ?Outcome: Progressing ?  ?Problem: Pain Managment: ?Goal: General experience of comfort will improve ?Outcome: Progressing ?  ?Problem: Safety: ?Goal: Ability to remain free from injury will improve ?Outcome: Progressing ?  ?

## 2021-09-10 NOTE — Progress Notes (Signed)
Physical Therapy Treatment ?Patient Details ?Name: Jason Figueroa ?MRN: [id number] ?DOB: 07-25-1965 ?Today's Date: 09/10/2021 ? ? ?History of Present Illness 56 y.o. male presents to [facility] on 07/25/2021 with numbness and tingling on left side of face along with L sided weakness. 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 PMH inlcudes HTN, previous stroke,  and recent TIA. ? ?  ?PT Comments  ? ? Pt was difficult to motivate and declined out of bed mobility. He participated in bed level mobility and required cuing an min assist to partially roll to his left side. He was able to roll right himself by pulling on the bed rail. Pt was educated on importance of mobility even when not particularly driven to do so. He said that he understood education but was not feeling up to it today. During treatment session patient showed deficits in strength, endurance, activity tolerance. Recommending therapy services at skilled nursing facility to address the previously stated deficits. Will continue to follow acutely to maximize functional mobility, independence and safety. ?  ?Recommendations for follow up therapy are one component of a multi-disciplinary discharge planning process, led by the attending physician.  Recommendations may be updated based on patient status, additional functional criteria and insurance authorization. ? ?Follow Up Recommendations ? Skilled nursing-short term rehab (<3 hours/day) ?  ?  ?Assistance Recommended at Discharge Intermittent Supervision/Assistance  ?Patient can return home with the following Help with stairs or ramp for entrance;A lot of help with walking and/or transfers;Assist for transportation ?  ?Equipment Recommendations ? Wheelchair (measurements PT);Wheelchair cushion (measurements PT);BSC/3in1  ?  ?Recommendations for Other Services Rehab  consult ? ? ?  ?Precautions / Restrictions Precautions ?Precautions: Fall ?Precaution Comments: GivMohr sling for OOB, L resting hand splint for night time use ?Restrictions ?Weight Bearing Restrictions: No  ?  ? ?Mobility ? Bed Mobility ?Overal bed mobility: Needs Assistance ?Bed Mobility: Rolling ?Rolling: Modified independent (Device/Increase time) ?  ?  ?  ?  ?General bed mobility comments: Pt was able to roll on his own to the right on his own for hygiene after bowl movment. He required Cuing with L LE rolling to the left and require min assist for leg position. ?  ? ?Transfers ?  ?  ?  ?  ?  ?  ?  ?  ?  ?  ?  ? ?Ambulation/Gait ?  ?  ?  ?  ?  ?  ?  ?  ? ? ?Stairs ?  ?  ?  ?  ?  ? ? ?Wheelchair Mobility ?  ? ?Modified Rankin (Stroke Patients Only) ?Modified Rankin (Stroke Patients Only) ?Pre-Morbid Rankin Score: No symptoms ?Modified Rankin: Severe disability ? ? ?  ?Balance   ?  ?  ?  ?  ?  ?  ?  ?  ?  ?  ?  ?  ?  ?  ?  ?  ?  ?  ?  ? ?  ?Cognition Arousal/Alertness: Awake/alert ?Behavior During Therapy: [facility] for tasks assessed/performed ?Overall Cognitive Status: Within Functional Limits for tasks assessed ?  ?  ?  ?  ?  ?  ?  ?  ?  ?  ?  ?  ?  ?  ?  ?  ?General Comments: Pt is exhibited decrease motivation for therapy today. ?  ?  ? ?  ?Exercises   ? ?  ?  General Comments General comments (skin integrity, edema, etc.): Pt declined getting out of bed today and did not perform sitting or standing balance. ?  ?  ? ?Pertinent Vitals/Pain Pain Assessment ?Pain Assessment: No/denies pain  ? ? ?Home Living   ?  ?  ?  ?  ?  ?  ?  ?  ?  ?   ?  ?Prior Function    ?  ?  ?   ? ?PT Goals (current goals can now be found in the care plan section)   ? ?  ?Frequency ? ? ? Min 3X/week ? ? ? ?  ?PT Plan Current plan remains appropriate  ? ? ?Co-evaluation   ?  ?  ?  ?  ? ?  ?AM-PAC PT "6 Clicks" Mobility   ?Outcome Measure ? Help needed turning from your back to your side while in a flat bed without using bedrails?: A  Little ?Help needed moving from lying on your back to sitting on the side of a flat bed without using bedrails?: A Little ?Help needed moving to and from a bed to a chair (including a wheelchair)?: A Lot ?Help needed standing up from a chair using your arms (e.g., wheelchair or bedside chair)?: A Lot ?Help needed to walk in hospital room?: Total ?Help needed climbing 3-5 steps with a railing? : Total ?6 Click Score: 12 ? ?  ?End of Session   ?Activity Tolerance: Patient tolerated treatment well ?Patient left: in bed;with call bell/phone within reach;with bed alarm set ?  ?PT Visit Diagnosis: Other abnormalities of gait and mobility (R26.89);Hemiplegia and hemiparesis ?Hemiplegia - Right/Left: Left ?Hemiplegia - dominant/non-dominant: Non-dominant ?Hemiplegia - caused by: Cerebral infarction ?  ? ? ?Time: 0865-7846 ?PT Time Calculation (min) (ACUTE ONLY): 16 min ? ?Charges:  $Therapeutic Activity: 8-22 mins          ?          ? ?Armanda Heritage, SPT ? ? ? ?Armanda Heritage ?09/10/2021, 3:18 PM ? ?

## 2021-09-11 ENCOUNTER — Encounter (HOSPITAL_COMMUNITY): Payer: Self-pay | Admitting: Internal Medicine

## 2021-09-11 MED ORDER — TRAZODONE HCL 150 MG PO TABS: 150.0000 mg | ORAL_TABLET | Freq: Every day | ORAL | 0 refills | Status: DC

## 2021-09-11 MED ORDER — AMLODIPINE BESYLATE 10 MG PO TABS: 10.0000 mg | ORAL_TABLET | Freq: Every day | ORAL | 0 refills | Status: DC

## 2021-09-11 MED ORDER — DULOXETINE HCL 30 MG PO CPEP: 30.0000 mg | ORAL_CAPSULE | Freq: Every day | ORAL | 0 refills | Status: DC

## 2021-09-11 MED ORDER — ATORVASTATIN CALCIUM 80 MG PO TABS: 80.0000 mg | ORAL_TABLET | Freq: Every day | ORAL | 0 refills | Status: DC

## 2021-09-11 MED ORDER — BACLOFEN 20 MG PO TABS
40.0000 mg | ORAL_TABLET | Freq: Every day | ORAL | 0 refills | Status: AC
Start: 2021-09-11 — End: 2021-10-11

## 2021-09-11 MED ORDER — LORATADINE 10 MG PO TABS: 10.0000 mg | ORAL_TABLET | Freq: Every day | ORAL | 0 refills | Status: AC

## 2021-09-11 MED ORDER — POLYETHYLENE GLYCOL 3350 17 G PO PACK: 17.0000 g | PACK | Freq: Every day | ORAL | 0 refills | Status: DC

## 2021-09-11 MED ORDER — SENNOSIDES-DOCUSATE SODIUM 8.6-50 MG PO TABS: 2.0000 | ORAL_TABLET | Freq: Two times a day (BID) | ORAL | 0 refills | Status: DC

## 2021-09-11 MED ORDER — LISINOPRIL 40 MG PO TABS: 40.0000 mg | ORAL_TABLET | Freq: Every day | ORAL | 0 refills | Status: DC

## 2021-09-11 MED ORDER — ALUM & MAG HYDROXIDE-SIMETH 200-200-20 MG/5ML PO SUSP: 30.0000 mL | ORAL | 0 refills | Status: DC | PRN

## 2021-09-11 MED ORDER — HYDRALAZINE HCL 10 MG PO TABS: 10.0000 mg | ORAL_TABLET | Freq: Four times a day (QID) | ORAL | 0 refills | Status: DC

## 2021-09-11 MED ORDER — ASPIRIN 81 MG PO CHEW: 81.0000 mg | CHEWABLE_TABLET | Freq: Every day | ORAL | 0 refills | Status: DC

## 2021-09-11 MED ORDER — ENOXAPARIN SODIUM 40 MG/0.4ML IJ SOSY
40.0000 mg | PREFILLED_SYRINGE | INTRAMUSCULAR | 0 refills | Status: DC
Start: 2021-09-11 — End: 2021-11-18

## 2021-09-11 MED ORDER — BACLOFEN 10 MG PO TABS: 10.0000 mg | ORAL_TABLET | Freq: Two times a day (BID) | ORAL | 0 refills | Status: AC

## 2021-09-11 MED ORDER — TRAZODONE HCL 50 MG PO TABS: 50.0000 mg | ORAL_TABLET | Freq: Every evening | ORAL | 0 refills | Status: DC | PRN

## 2021-09-11 MED ORDER — LEVOTHYROXINE SODIUM 100 MCG PO TABS: 100.0000 ug | ORAL_TABLET | Freq: Every day | ORAL | 0 refills | Status: DC

## 2021-09-11 NOTE — Plan of Care (Signed)
Patient discharged via [facility] with personal belongings and AVS. Ateshia at rehab given report. ? ?Problem: Education: ?Goal: Knowledge of disease or condition will improve ?Outcome: Adequate for Discharge ?Goal: Knowledge of secondary prevention will improve (SELECT ALL) ?Outcome: Adequate for Discharge ?Goal: Knowledge of patient specific risk factors will improve (INDIVIDUALIZE FOR PATIENT) ?Outcome: Adequate for Discharge ?Goal: Individualized Educational Video(s) ?Outcome: Adequate for Discharge ?  ?Problem: Coping: ?Goal: Will verbalize positive feelings about self ?Outcome: Adequate for Discharge ?Goal: Will identify appropriate support needs ?Outcome: Adequate for Discharge ?  ?Problem: Health Behavior/Discharge Planning: ?Goal: Ability to manage health-related needs will improve ?Outcome: Adequate for Discharge ?  ?Problem: Self-Care: ?Goal: Ability to participate in self-care as condition permits will improve ?Outcome: Adequate for Discharge ?Goal: Verbalization of feelings and concerns over difficulty with self-care will improve ?Outcome: Adequate for Discharge ?Goal: Ability to communicate needs accurately will improve ?Outcome: Adequate for Discharge ?  ?Problem: Nutrition: ?Goal: Risk of aspiration will decrease ?Outcome: Adequate for Discharge ?  ?Problem: Ischemic Stroke/TIA Tissue Perfusion: ?Goal: Complications of ischemic stroke/TIA will be minimized ?Outcome: Adequate for Discharge ?  ?Problem: Education: ?Goal: Knowledge of General Education information will improve ?Description: Including pain rating scale, medication(s)/side effects and non-pharmacologic comfort measures ?Outcome: Adequate for Discharge ?  ?Problem: Health Behavior/Discharge Planning: ?Goal: Ability to manage health-related needs will improve ?Outcome: Adequate for Discharge ?  ?Problem: Clinical Measurements: ?Goal: Ability to maintain clinical measurements within normal limits will improve ?Outcome: Adequate for  Discharge ?Goal: Will remain free from infection ?Outcome: Adequate for Discharge ?Goal: Diagnostic test results will improve ?Outcome: Adequate for Discharge ?Goal: Respiratory complications will improve ?Outcome: Adequate for Discharge ?Goal: Cardiovascular complication will be avoided ?Outcome: Adequate for Discharge ?  ?Problem: Activity: ?Goal: Risk for activity intolerance will decrease ?Outcome: Adequate for Discharge ?  ?Problem: Coping: ?Goal: Level of anxiety will decrease ?Outcome: Adequate for Discharge ?  ?Problem: Elimination: ?Goal: Will not experience complications related to bowel motility ?Outcome: Adequate for Discharge ?Goal: Will not experience complications related to urinary retention ?Outcome: Adequate for Discharge ?  ?Problem: Pain Managment: ?Goal: General experience of comfort will improve ?Outcome: Adequate for Discharge ?  ?Problem: Safety: ?Goal: Ability to remain free from injury will improve ?Outcome: Adequate for Discharge ?  ?Problem: Skin Integrity: ?Goal: Risk for impaired skin integrity will decrease ?Outcome: Adequate for Discharge ?  ?

## 2021-09-11 NOTE — Discharge Summary (Signed)
? ?Name: Jason Figueroa ?MRN: [id number] ?DOB: Nov 29, 1965 56 y.o. ?PCP: Pcp, No ? ?Date of Admission: 07/25/2021 11:56 AM ?Date of Discharge:   09/11/21  9:30 AM ?Attending Physician: Lottie Mussel, MD ? ?Discharge Diagnosis: ?Principal Problem: ?  Stroke [facility]) ?Active Problems: ?  Left-sided weakness ?  Adjustment disorder with mixed anxiety and depressed mood ?  Hypothyroid ? AKI (acute kidney injury) ([place]), resolved ?  Other chest pain, resolved ? ?Discharge Medications: ?Allergies as of 09/11/2021   ? ?   Reactions  ? Ivp Dye [iodinated Contrast Media] Other (See Comments)  ? Redness around neck  ? ?  ? ?  ?Medication List  ?  ? ?STOP taking these medications   ? ?[employer] NASAL DECONGESTANT IN ?  ?gabapentin 600 MG tablet ?Commonly known as: NEURONTIN ?  ? ?  ? ?TAKE these medications   ? ?alum & mag hydroxide-simeth 200-200-20 MG/5ML suspension ?Commonly known as: MAALOX/MYLANTA ?Take 30 mLs by mouth every 4 (four) hours as needed for indigestion. ?  ?amLODipine 10 MG tablet ?Commonly known as: NORVASC ?Take 1 tablet (10 mg total) by mouth daily. ?  ?aspirin 81 MG chewable tablet ?Chew 1 tablet (81 mg total) by mouth daily. ?  ?atorvastatin 80 MG tablet ?Commonly known as: LIPITOR ?Take 1 tablet (80 mg total) by mouth daily. ?Start taking on: September 12, 2021 ?  ?baclofen 20 MG tablet ?Commonly known as: LIORESAL ?Take 2 tablets (40 mg total) by mouth at bedtime. ?  ?baclofen 10 MG tablet ?Commonly known as: LIORESAL ?Take 1 tablet (10 mg total) by mouth 2 (two) times daily. ?  ?DULoxetine 30 MG capsule ?Commonly known as: CYMBALTA ?Take 1 capsule (30 mg total) by mouth daily. ?Start taking on: September 12, 2021 ?  ?enoxaparin 40 MG/0.4ML injection ?Commonly known as: LOVENOX ?Inject 0.4 mLs (40 mg total) into the skin daily. ?  ?hydrALAZINE 10 MG tablet ?Commonly known as: APRESOLINE ?Take 1 tablet (10 mg total) by mouth 4 (four) times daily. ?  ?levothyroxine 100 MCG tablet ?Commonly known as: SYNTHROID ?Take 1  tablet (100 mcg total) by mouth daily at 6 (six) AM. ?Start taking on: September 12, 2021 ?  ?lisinopril 40 MG tablet ?Commonly known as: ZESTRIL ?Take 1 tablet (40 mg total) by mouth daily. ?Start taking on: September 12, 2021 ?What changed:  ?medication strength ?how much to take ?  ?loratadine 10 MG tablet ?Commonly known as: CLARITIN ?Take 1 tablet (10 mg total) by mouth daily. ?Start taking on: September 12, 2021 ?  ?polyethylene glycol 17 g packet ?Commonly known as: MIRALAX / GLYCOLAX ?Take 17 g by mouth daily. ?Start taking on: September 12, 2021 ?  ?senna-docusate 8.6-50 MG tablet ?Commonly known as: Senokot-S ?Take 2 tablets by mouth 2 (two) times daily. ?  ?traZODone 50 MG tablet ?Commonly known as: DESYREL ?Take 1 tablet (50 mg total) by mouth at bedtime as needed for sleep (Can give additonal 50mg  PRN in addition to scheduled 100mg  nightly dose). ?  ?traZODone 150 MG tablet ?Commonly known as: DESYREL ?Take 1 tablet (150 mg total) by mouth at bedtime. ?  ? ?  ? ? ?Disposition and follow-up:   ?Mr.Jason Figueroa was discharged from [facility] in Stable condition.  At the hospital follow up visit please address: ? ?1.  Neurologic progression/ L sided-deficits, muscle spasms,  ? ?2.  Labs / imaging needed at time of follow-up: N/A ? ?3.  Pending labs/ test needing follow-up: N/A ? ?Follow-up  Appointments: ? Follow-up Information   ? ? [place] Follow up.   ?Why: call to establish a primary care doctor ?Contact information: ?[address] ?[place] [phone] ?[phone] ? ?  ?  ? ? Garvin Fila, MD. Schedule an appointment as soon as possible for a visit in 1 month(s).   ?Specialties: Neurology, Radiology ?Why: stroke clinic ?Contact information: ?[address] ?[address] ?[place] [address] ?[phone] ? ? ?  ?  ? ? Sherren Mocha, MD. Schedule an appointment as soon as possible for a visit in 1 month(s).   ?Specialty:  Cardiology ?Contact information: ?[address] ?[address] ?[place] [address] ?[phone] ? ? ?  ?  ? ? Nahser, Wonda Cheng, MD Follow up.   ?Specialty: Cardiology ?Why: Appointment with General Cardiology scheduled for 09/23/2021 at 8:40am to discuss heart monitor results. Our office will call you to help arrange monitor. ?Contact information: ?[address] [place] ?[address] ?[place] [address] ?[phone] ? ? ?  ?  ? ?  ?  ? ?  ? ? 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 Post-stroke, patient completed Plavix course as well as 30 day monitoring on telemetry. No ongoing encephalopathy or pseudobulbar affect after baclofen dose reduction (previously 40 mg TID). Working with PT; left sided deficits persist but improving. Patient has been on continuous pulse oximetry throughout admission with no apneic events, so outpatient sleep study no longer needed, as previously recommended. Patient to receive more intensive PT through SNF at [facility]. Discharge medications: Baclofen 10 mg BID and 40 mg qHS for muscle spasms (may need times of medications adjusted), Cymbalta 30 mg, ASA 81mg  daily, and Lipitor 80 mg daily. ?  ?  ?Chest pain, resolved ?Patient with dull left-sided chest pain radiating down left arm.  No abnormal heart sounds were auscultated. He believes it may have been positional. No signs of ischemia, so ACS ruled out: Troponins 6, BMP with no electrolyte abnormalities, and EKG with sinus bradycardia. Most likely 2/2 indigestion/GERD. Upon discharge, continue as needed Maalox/Mylanta for indigestion. ?  ?Acute L Shoulder pain, resolved ?Acute L arm pain, resolved ?L shoulder pain described as  tightness, relieved by stretching. L arm pain described as shooting, relieved by removing brace. Patient has GivMohr sling for L arm and by the time of discharge, resolved. Advised to give muscle rub cream PRN for such a pain, as this provided relief. ?  ?Constipation, resolved ?Indigestion, resolved ?Every other day BM on current bowel regimen: miralax daily and senna-s 2 tablets twice daily, as well as maalox/mylanta PRN. ?  ?Adjustment Disorder ?Patient with low mood and decreased motivation after losing independence post-stroke. Significantly improved mood and sleep with this regimen. Upon discharge, continue Cymbalta 30 mg qd for depressive sx 2/2 adjustment and neuropathic pain as well as Trazodone 150 mg qhs along with 50mg  x1 prn for insomnia. ?  ?HTN ?BP stable and well controlled throughout admission. Patient developed AKI after initiation of HCTZ, so it was discontinued and AKI resolved Upon discharge, continue Lisinopril 40 mg, amlodipine 10 mg, and Hydralazine 10 mg TID. ?  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 Mental status at baseline. Repeat thyoid testing showing elevated TSH but normal free T4. Upon discharge, continue PO synthroid 141mcg. ? ?Discharge Subjective: ?Patient reports feeling well and ready to discharge to SNF, as he is motivated to have more intensive therapy. He denies acute concerns or complaints today.  ? ?Discharge Exam:   ?BP 117/74 (BP Location: Right Arm)   Pulse (!) 56   Temp 97.8 ?F (36.6 ?C) (Oral)   Resp 16   Ht 5\' 9"  (1.753 m)   Wt 88.2 kg   SpO2 97%   BMI 28.71 kg/m?  ?Discharge exam:  ?General: well-appearing middle aged male, lying in bed, in NAD. ?CV: RRR, no m/r/g. Non-edematous LE ?Pulm: normal work of breathing on RA. ?Abdomen: Soft, NT/ND. Normal bowel sounds in all 4 quadrants ?Neuro: AAOx3, mild residual L facial droop noted; dysarthria largely resolved.  Strength in LUE and LLE remains 0/5. RUE and RLE strength 4/5. ?MSK: brace in place on L knee. ?Psych: normal mood and affect. ? ?Pertinent Labs, Studies, and Procedures:  ? ?MR ANGIO HEAD WO CONTRAST ? ?Result Date: 07/25/18

## 2021-09-11 NOTE — TOC Transition Note (Signed)
Transition of Care (TOC) - CM/SW Discharge Note ? ? ?Patient Details  ?Name: Jason Figueroa ?MRN: [id number] ?Date of Birth: July 17, 1965 ? ?Transition of Care [facility]) CM/SW Contact:  ?Baldemar Lenis, LCSW ?Phone Number: ?09/11/2021, 1:07 PM ? ? ?Clinical Narrative:   CSW sent discharge information to [name] and confirmed paperwork was all completed. Transport scheduled with [facility] for next available. No other TOC needs identified at this time. ? ?Nurse to call report to [phone], Room 210-B. ? ? ? ?Final next level of care: Skilled Nursing Facility ?Barriers to Discharge: Barriers Resolved ? ? ?Patient Goals and CMS Choice ?Patient states their goals for this hospitalization and ongoing recovery are:: to get better ?CMS Medicare.gov Compare Post Acute Care list provided to:: Patient ?Choice offered to / list presented to : Patient ? ?Discharge Placement ?  ?           ?Patient chooses bed at: [facility] ?Patient to be transferred to facility by: [facility] ?Name of family member notified: Self ?Patient and family notified of of transfer: 09/11/21 ? ?Discharge Plan and Services ?  ?  ?Post Acute Care Choice: Skilled Nursing Facility          ?  ?  ?  ?  ?  ?  ?  ?  ?  ?  ? ?Social Determinants of Health (SDOH) Interventions ?  ? ? ?Readmission Risk Interventions ?   ? View : No data to display.  ?  ?  ?  ? ? ? ? ? ?

## 2021-09-11 NOTE — Plan of Care (Signed)
Pt is alert oriented x 4. Pt denies pain. Trazadone given per order. Pt resting. Pt denies numbness or tinging. Left side flaccid.  ? ? ?Problem: Education: ?Goal: Knowledge of disease or condition will improve ?Outcome: Progressing ?Goal: Knowledge of secondary prevention will improve (SELECT ALL) ?Outcome: Progressing ?Goal: Knowledge of patient specific risk factors will improve (INDIVIDUALIZE FOR PATIENT) ?Outcome: Progressing ?Goal: Individualized Educational Video(s) ?Outcome: Progressing ?  ?Problem: Coping: ?Goal: Will verbalize positive feelings about self ?Outcome: Progressing ?Goal: Will identify appropriate support needs ?Outcome: Progressing ?  ?Problem: Health Behavior/Discharge Planning: ?Goal: Ability to manage health-related needs will improve ?Outcome: Progressing ?  ?Problem: Self-Care: ?Goal: Ability to participate in self-care as condition permits will improve ?Outcome: Progressing ?Goal: Verbalization of feelings and concerns over difficulty with self-care will improve ?Outcome: Progressing ?Goal: Ability to communicate needs accurately will improve ?Outcome: Progressing ?  ?Problem: Nutrition: ?Goal: Risk of aspiration will decrease ?Outcome: Progressing ?  ?Problem: Ischemic Stroke/TIA Tissue Perfusion: ?Goal: Complications of ischemic stroke/TIA will be minimized ?Outcome: Progressing ?  ?Problem: Education: ?Goal: Knowledge of General Education information will improve ?Description: Including pain rating scale, medication(s)/side effects and non-pharmacologic comfort measures ?Outcome: Progressing ?  ?Problem: Health Behavior/Discharge Planning: ?Goal: Ability to manage health-related needs will improve ?Outcome: Progressing ?  ?Problem: Clinical Measurements: ?Goal: Ability to maintain clinical measurements within normal limits will improve ?Outcome: Progressing ?Goal: Will remain free from infection ?Outcome: Progressing ?Goal: Diagnostic test results will improve ?Outcome:  Progressing ?Goal: Respiratory complications will improve ?Outcome: Progressing ?Goal: Cardiovascular complication will be avoided ?Outcome: Progressing ?  ?Problem: Activity: ?Goal: Risk for activity intolerance will decrease ?Outcome: Progressing ?  ?Problem: Coping: ?Goal: Level of anxiety will decrease ?Outcome: Progressing ?  ?Problem: Elimination: ?Goal: Will not experience complications related to bowel motility ?Outcome: Progressing ?Goal: Will not experience complications related to urinary retention ?Outcome: Progressing ?  ?Problem: Pain Managment: ?Goal: General experience of comfort will improve ?Outcome: Progressing ?  ?Problem: Safety: ?Goal: Ability to remain free from injury will improve ?Outcome: Progressing ?  ?Problem: Skin Integrity: ?Goal: Risk for impaired skin integrity will decrease ?Outcome: Progressing ?  ?

## 2021-09-22 ENCOUNTER — Telehealth: Payer: Self-pay

## 2021-09-22 NOTE — Telephone Encounter (Signed)
The patient's appointment (PFO consult with Dr. Excell Seltzer) was cancelled for this Friday. ?Upon investigation, the patient's aunt called in and cancelled citing the patient is under the care of another cardiologist. ?

## 2021-09-23 ENCOUNTER — Ambulatory Visit: Payer: Self-pay | Admitting: Cardiovascular Disease

## 2021-09-25 ENCOUNTER — Institutional Professional Consult (permissible substitution): Payer: Self-pay | Admitting: Cardiovascular Disease

## 2021-11-18 ENCOUNTER — Encounter: Payer: Self-pay | Admitting: Neurology

## 2021-11-18 ENCOUNTER — Ambulatory Visit: Payer: Self-pay | Admitting: Neurology

## 2021-11-18 VITALS — BP 136/78 | HR 69 | Ht 69.0 in | Wt 176.0 lb

## 2021-11-18 DIAGNOSIS — I6340 Cerebral infarction due to embolism of unspecified cerebral artery: Secondary | ICD-10-CM

## 2021-11-18 DIAGNOSIS — E782 Mixed hyperlipidemia: Secondary | ICD-10-CM

## 2021-11-18 DIAGNOSIS — I69354 Hemiplegia and hemiparesis following cerebral infarction affecting left non-dominant side: Secondary | ICD-10-CM

## 2021-11-18 NOTE — Progress Notes (Signed)
[facility] [address] [place]. [place] [address] [id number]       OFFICE FOLLOW-UP NOTE  Jason Figueroa Date of Birth:  1966/04/09 Medical Record Number:  [id number]   HPI: Jason Figueroa is a 56 year old Caucasian male seen today for initial office follow-up visit following hospital admission for stroke in February 2023.  He is accompanied by his daughter and daughter's mother.  History is obtained from them, review of electronic medical records and opossum reviewed pertinent available imaging films in PACS.  He has past medical history of hypertension, prior stroke and TIA a few months ago who presented on 07/25/2021 with a 17-hour history of left facial paresthesias with facial droop and left arm and leg weakness  after his symptoms worsen overnight.  He was to be on timed for thrombolysis and due to concerns about dye allergy CTA was not done.  CT head showed no acute hemorrhage or infarct but age-indeterminate lacunar infarcts involving bilateral basal ganglia, right thalamus and right pons and changes of small vessel disease.  MRI scan showed scattered acute infarcts MR angiogram showed severe stenosis of P2 right posterior cerebral and moderate to severe stenosis of left posterior cerebral in the P2/P3 segments.  2D echo showed ejection fraction of 60 to 65% with mild left ventricular hypertrophy.  TEE showed a large PFO with right-to-left shunting but patient had a low rope score of 4 hence was not considered a candidate for PFO closure.  LDL cholesterol is quite elevated at 244 mg percent.  Hemoglobin A1c was 5.9.  He was started on dual antiplatelet therapy for 3 weeks and then aspirin alone on Lipitor 80 mg.  Patient was not considered a candidate for CLL and was transferred to skilled nursing facility for rehab.  He is still currently living there.  He states he still has significant left-sided weakness and poor balance and is unable to get up and walk by himself.   He is getting outpatient physical occupational therapy about 2 days a week.  He is tolerating aspirin well with only minor bruising and no bleeding.  Tolerating Lipitor well without muscle aches and pains.  He has not had a follow-up lipid profile checked.  His blood pressure is under good control and today it is 136/78.  He is essentially wheelchair-bound and unable to walk.  ROS:   14 system review of systems is positive for weakness, imbalance, difficulty walking, bruising all other systems negative  PMH:  Past Medical History:  Diagnosis Date   AKI (acute kidney injury) ([place])    Hypertension    Stroke ([place])     Social History:  Social History   Socioeconomic History   Marital status: Single    Spouse name: Not on file   Number of children: Not on file   Years of education: Not on file   Highest education level: Not on file  Occupational History   Not on file  Tobacco Use   Smoking status: Every Day    Types: Cigarettes   Smokeless tobacco: Never  Substance and Sexual Activity   Alcohol use: Not on file   Drug use: Not on file   Sexual activity: Not on file  Other Topics Concern   Not on file  Social History Narrative   Not on file   Social Determinants of Health   Financial Resource Strain: Not on file  Food Insecurity: Not on file  Transportation Needs: Not on file  Physical Activity: Not on  file  Stress: Not on file  Social Connections: Not on file  Intimate Partner Violence: Not on file    Medications:   Current Outpatient Medications on File Prior to Visit  Medication Sig Dispense Refill   amLODipine (NORVASC) 10 MG tablet Take 1 tablet (10 mg total) by mouth daily. 30 tablet 0   atorvastatin (LIPITOR) 80 MG tablet Take 1 tablet (80 mg total) by mouth daily. 30 tablet 0   baclofen (LIORESAL) 20 MG tablet Take 20 mg by mouth 3 (three) times daily.     hydrALAZINE (APRESOLINE) 10 MG tablet Take 1 tablet (10 mg total) by mouth 4 (four) times daily. 30 tablet  0   levothyroxine (SYNTHROID) 100 MCG tablet Take 1 tablet (100 mcg total) by mouth daily at 6 (six) AM. (Patient taking differently: Take 125 mcg by mouth daily at 6 (six) AM.) 30 tablet 0   lisinopril (ZESTRIL) 40 MG tablet Take 1 tablet (40 mg total) by mouth daily. 30 tablet 0   loratadine (CLARITIN) 10 MG tablet Take 1 tablet (10 mg total) by mouth daily. 30 tablet 0   rOPINIRole (REQUIP) 0.25 MG tablet Take 0.25 mg by mouth 3 (three) times daily.     traZODone (DESYREL) 150 MG tablet Take 1 tablet (150 mg total) by mouth at bedtime. (Patient taking differently: Take 100 mg by mouth at bedtime.) 30 tablet 0   No current facility-administered medications on file prior to visit.    Allergies:   Allergies  Allergen Reactions   Ivp Dye [Iodinated Contrast Media] Other (See Comments)    Redness around neck    Physical Exam General: well developed, well nourished, seated, in no evident distress Head: head normocephalic and atraumatic.  Neck: supple with no carotid or supraclavicular bruits Cardiovascular: regular rate and rhythm, no murmurs Musculoskeletal: no deformity Skin:  no rash/petichiae Vascular:  Normal pulses all extremities Vitals:   11/18/21 0954  BP: 136/78  Pulse: 69   Neurologic Exam Mental Status: Awake and fully alert. Oriented to place and time. Recent and remote memory intact. Attention span, concentration and fund of knowledge appropriate. Mood and affect appropriate.  Cranial Nerves: Fundoscopic exam reveals sharp disc margins. Pupils equal, briskly reactive to light. Extraocular movements full without nystagmus. Visual fields full to confrontation. Hearing intact. Facial sensation intact.  No facial asymmetry., tongue, palate moves normally and symmetrically.  Motor: Dense left hemiplegia and can believe flicker his fingers in the left hand.  Tone is slightly increased in the left leg.  Normal strength on the right.. Sensory.: intact to touch ,pinprick .position  and vibratory sensation.  Coordination: Rapid alternating movements normal in all extremities. Finger-to-nose and heel-to-shin performed accurately bilaterally. Gait and Station: Unable to test as patient is not able to walk even with assistance.   Reflexes: 2+ brisk and symmetric. Toes downgoing.   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  Prior history of stroke and TIAs and vascular risk factors of obesity, hypertension, hyperlipidemia and cerebrovascular disease.  He has a PFO but has a low ROPE  score of 4 hence is not a candidate for endovascular closure     PLAN:I had a long d/w patient his daughter and daughter's mother about his recent multiple strokes and residual left hemiparesis, risk for recurrent stroke/TIAs, personally independently reviewed imaging studies and stroke evaluation results  and answered questions.Continue aspirin 81 mg daily  for secondary stroke prevention and maintain strict control of hypertension with blood pressure goal below 130/90, diabetes with hemoglobin A1c goal below 6.5% and lipids with LDL cholesterol goal below 70 mg/dL. I also advised the patient to eat a healthy diet with plenty of whole grains, cereals, fruits and vegetables, exercise regularly and maintain ideal body weight .he was encouraged to continue ongoing outpatient physical and occupational therapy.  Check follow-up lipid profile today.  Patient was counseled to not do any recreational drugs.  Followup in the future with my nurse practitioner in 3 months or call earlier if necessary. Greater than 50% of time during this 35 minute visit was spent on counseling,explanation of diagnosis of strokes and hemiplegia, planning of further management, discussion with patient and family and coordination of care Antony Contras, MD Note: This document was prepared with digital dictation and possible smart phrase technology. Any transcriptional errors that result from this process are unintentional

## 2021-11-18 NOTE — Patient Instructions (Signed)
I had a long d/w patient his daughter and daughter's mother about his recent multiple strokes and residual left hemiparesis, risk for recurrent stroke/TIAs, personally independently reviewed imaging studies and stroke evaluation results and answered questions.Continue aspirin 81 mg daily  for secondary stroke prevention and maintain strict control of hypertension with blood pressure goal below 130/90, diabetes with hemoglobin A1c goal below 6.5% and lipids with LDL cholesterol goal below 70 mg/dL. I also advised the patient to eat a healthy diet with plenty of whole grains, cereals, fruits and vegetables, exercise regularly and maintain ideal body weight .he was encouraged to continue ongoing outpatient physical and occupational therapy.  Check follow-up lipid profile today.  Patient was counseled to not do any recreational drugs.  Followup in the future with my nurse practitioner in 3 months or call earlier if necessary. Stroke Prevention Some medical conditions and behaviors can lead to a higher chance of having a stroke. You can help prevent a stroke by eating healthy, exercising, not smoking, and managing any medical conditions you have. Stroke is a leading cause of functional impairment. Primary prevention is particularly important because a majority of strokes are first-time events. Stroke changes the lives of not only those who experience a stroke but also their family and other caregivers. How can this condition affect me? A stroke is a medical emergency and should be treated right away. A stroke can lead to brain damage and can sometimes be life-threatening. If a person gets medical treatment right away, there is a better chance of surviving and recovering from a stroke. What can increase my risk? The following medical conditions may increase your risk of a stroke: Cardiovascular disease. High blood pressure (hypertension). Diabetes. High cholesterol. Sickle cell disease. Blood clotting  disorders (hypercoagulable state). Obesity. Sleep disorders (obstructive sleep apnea). Other risk factors include: Being older than age 58. Having a history of blood clots, stroke, or mini-stroke (transient ischemic attack, TIA). Genetic factors, such as race, ethnicity, or a family history of stroke. Smoking cigarettes or using other tobacco products. Taking birth control pills, especially if you also use tobacco. Heavy use of alcohol or drugs, especially cocaine and methamphetamine. Physical inactivity. What actions can I take to prevent this? Manage your health conditions High cholesterol levels. Eating a healthy diet is important for preventing high cholesterol. If cholesterol cannot be managed through diet alone, you may need to take medicines. Take any prescribed medicines to control your cholesterol as told by your health care provider. Hypertension. To reduce your risk of stroke, try to keep your blood pressure below 130/80. Eating a healthy diet and exercising regularly are important for controlling blood pressure. If these steps are not enough to manage your blood pressure, you may need to take medicines. Take any prescribed medicines to control hypertension as told by your health care provider. Ask your health care provider if you should monitor your blood pressure at home. Have your blood pressure checked every year, even if your blood pressure is normal. Blood pressure increases with age and some medical conditions. Diabetes. Eating a healthy diet and exercising regularly are important parts of managing your blood sugar (glucose). If your blood sugar cannot be managed through diet and exercise, you may need to take medicines. Take any prescribed medicines to control your diabetes as told by your health care provider. Get evaluated for obstructive sleep apnea. Talk to your health care provider about getting a sleep evaluation if you snore a lot or have excessive sleepiness. Make  sure  that any other medical conditions you have, such as atrial fibrillation or atherosclerosis, are managed. Nutrition Follow instructions from your health care provider about what to eat or drink to help manage your health condition. These instructions may include: Reducing your daily calorie intake. Limiting how much salt (sodium) you use to 1,500 milligrams (mg) each day. Using only healthy fats for cooking, such as olive oil, canola oil, or sunflower oil. Eating healthy foods. You can do this by: Choosing foods that are high in fiber, such as whole grains, and fresh fruits and vegetables. Eating at least 5 servings of fruits and vegetables a day. Try to fill one-half of your plate with fruits and vegetables at each meal. Choosing lean protein foods, such as lean cuts of meat, poultry without skin, fish, tofu, beans, and nuts. Eating low-fat dairy products. Avoiding foods that are high in sodium. This can help lower blood pressure. Avoiding foods that have saturated fat, trans fat, and cholesterol. This can help prevent high cholesterol. Avoiding processed and prepared foods. Counting your daily carbohydrate intake.  Lifestyle If you drink alcohol: Limit how much you have to: 0-1 drink a day for women who are not pregnant. 0-2 drinks a day for men. Know how much alcohol is in your drink. In the [place] one drink equals one 12 oz bottle of beer ( ), one 5 oz glass of wine ( ), or one 1 oz glass of hard liquor (29mL). Do not use any products that contain nicotine or tobacco. These products include cigarettes, chewing tobacco, and vaping devices, such as e-cigarettes. If you need help quitting, ask your health care provider. Avoid secondhand smoke. Do not use drugs. Activity  Try to stay at a healthy weight. Get at least 30 minutes of exercise on most days, such as: Fast walking. Biking. Swimming. Medicines Take over-the-counter and prescription medicines only as told by your  health care provider. Aspirin or blood thinners (antiplatelets or anticoagulants) may be recommended to reduce your risk of forming blood clots that can lead to stroke. Avoid taking birth control pills. Talk to your health care provider about the risks of taking birth control pills if: You are over 23 years old. You smoke. You get very bad headaches. You have had a blood clot. Where to find more information American Stroke Association: www.strokeassociation.org Get help right away if: You or a loved one has any symptoms of a stroke. "BE FAST" is an easy way to remember the main warning signs of a stroke: B - Balance. Signs are dizziness, sudden trouble walking, or loss of balance. E - Eyes. Signs are trouble seeing or a sudden change in vision. F - Face. Signs are sudden weakness or numbness of the face, or the face or eyelid drooping on one side. A - Arms. Signs are weakness or numbness in an arm. This happens suddenly and usually on one side of the body. S - Speech. Signs are sudden trouble speaking, slurred speech, or trouble understanding what people say. T - Time. Time to call emergency services. Write down what time symptoms started. You or a loved one has other signs of a stroke, such as: A sudden, severe headache with no known cause. Nausea or vomiting. Seizure. These symptoms may represent a serious problem that is an emergency. Do not wait to see if the symptoms will go away. Get medical help right away. Call your local emergency services (911 in the [place] Do not drive yourself to the hospital. Summary You can help to  prevent a stroke by eating healthy, exercising, not smoking, limiting alcohol intake, and managing any medical conditions you may have. Do not use any products that contain nicotine or tobacco. These include cigarettes, chewing tobacco, and vaping devices, such as e-cigarettes. If you need help quitting, ask your health care provider. Remember "BE FAST" for warning  signs of a stroke. Get help right away if you or a loved one has any of these signs. This information is not intended to replace advice given to you by your health care provider. Make sure you discuss any questions you have with your health care provider. Document Revised: 01/07/2020 Document Reviewed: 01/07/2020 Elsevier Patient Education  2023 [employer].

## 2021-11-19 LAB — LIPID PANEL
Chol/HDL Ratio: 3 ratio (ref 0.0–5.0)
Cholesterol, Total: 153 mg/dL (ref 100–199)
HDL: 51 mg/dL (ref >39)
LDL Chol Calc (NIH): 74 mg/dL (ref 0–99)
Triglycerides: 167 mg/dL — ABNORMAL HIGH (ref 0–149)
VLDL Cholesterol Cal: 28 mg/dL (ref 5–40)

## 2022-02-18 ENCOUNTER — Encounter: Payer: Self-pay | Admitting: Adult Health

## 2022-02-18 ENCOUNTER — Ambulatory Visit: Payer: Medicaid Other | Admitting: Adult Health

## 2022-02-18 VITALS — BP 160/92 | HR 76

## 2022-02-18 DIAGNOSIS — I6340 Cerebral infarction due to embolism of unspecified cerebral artery: Secondary | ICD-10-CM

## 2022-02-18 DIAGNOSIS — G8194 Hemiplegia, unspecified affecting left nondominant side: Secondary | ICD-10-CM | POA: Diagnosis not present

## 2022-02-18 DIAGNOSIS — M25562 Pain in left knee: Secondary | ICD-10-CM

## 2022-02-18 DIAGNOSIS — G8929 Other chronic pain: Secondary | ICD-10-CM | POA: Diagnosis not present

## 2022-02-18 MED ORDER — AMLODIPINE BESYLATE 10 MG PO TABS: 10.0000 mg | ORAL_TABLET | Freq: Every day | ORAL | 0 refills | Status: AC

## 2022-02-18 MED ORDER — ATORVASTATIN CALCIUM 80 MG PO TABS: 80.0000 mg | ORAL_TABLET | Freq: Every day | ORAL | 0 refills | Status: AC

## 2022-02-18 MED ORDER — LEVOTHYROXINE SODIUM 125 MCG PO TABS: 125.0000 ug | ORAL_TABLET | Freq: Every day | ORAL | 0 refills | Status: AC

## 2022-02-18 MED ORDER — LISINOPRIL 40 MG PO TABS: 40.0000 mg | ORAL_TABLET | Freq: Every day | ORAL | 0 refills | Status: AC

## 2022-02-18 MED ORDER — HYDRALAZINE HCL 10 MG PO TABS: 10.0000 mg | ORAL_TABLET | Freq: Four times a day (QID) | ORAL | 3 refills | Status: DC

## 2022-02-18 MED ORDER — BACLOFEN 20 MG PO TABS: 20.0000 mg | ORAL_TABLET | Freq: Four times a day (QID) | ORAL | 11 refills | Status: DC

## 2022-02-18 MED ORDER — TRAZODONE HCL 150 MG PO TABS: 150.0000 mg | ORAL_TABLET | Freq: Every day | ORAL | 3 refills | Status: AC

## 2022-02-18 MED ORDER — ASPIRIN 81 MG PO TBEC: 81.0000 mg | DELAYED_RELEASE_TABLET | Freq: Every day | ORAL | 12 refills | Status: AC

## 2022-02-18 MED ORDER — ROPINIROLE HCL 0.25 MG PO TABS: 0.2500 mg | ORAL_TABLET | Freq: Three times a day (TID) | ORAL | 4 refills | Status: DC

## 2022-02-18 NOTE — Progress Notes (Signed)
[facility] [address] [place]. [place] [address] [phone]       OFFICE FOLLOW-UP NOTE  Jason Figueroa Date of Birth:  Mar 22, 1966 Medical Record Number:  [id number]   Primary neurologist: Dr. Pearlean Figueroa Reason for visit: Stroke follow-up   Chief Complaint  Patient presents with   Follow-up    Pt is well. He has questions about getting a prescription hospital bed. He states he has arthritis is left leg and asking if he can take pain medication for it. Also asking for handicap sticker.  Room 2 with friend      HPI:   Update 02/18/2022 JM: Patient returns for 43-month stroke follow-up visit accompanied by a friend.  He continues to work with PT at [facility] [place], he does note some improvement of left leg greater than arm since prior visit.  He is able to ambulate short distance with a steady cane otherwise transfers via wheelchair.  He remains on baclofen for increased tone on left side which has been beneficial but does still have some pain especially when trying to sleep at night.  He believes further ambulation is limited due to chronic left knee pain which was present prior to his stroke, followed by orthopedic provider back in [place] but has not reestablished since moving to this area.  He is requesting a hospital bed as he is currently sleeping in a recliner, he has great difficulty laying completely flat and being able to sit back up.  He is also requesting a handicap placard.  He does have occasional slurred speech and continued left facial weakness.  Denies new stroke/TIA symptoms.  Compliant on atorvastatin, denies side effects. Has not been taking aspirin, friend states she keeps forgetting to add aspirin to his medications.  Blood pressure 160/92, does not routinely monitor at home.  He is scheduled to establish care with PCP in November but is asking for refill on medications until that time  No further concerns at  this time.    History provided for reference purposes only Initial visit 11/18/2021 Dr. Pearlean Figueroa: Jason Figueroa is a 56 year old Caucasian male seen today for initial office follow-up visit following hospital admission for stroke in February 2023.  He is accompanied by his daughter and daughter's mother.  History is obtained from them, review of electronic medical records and opossum reviewed pertinent available imaging films in PACS.  He has past medical history of hypertension, prior stroke and TIA a few months ago who presented on 07/25/2021 with a 17-hour history of left facial paresthesias with facial droop and left arm and leg weakness  after his symptoms worsen overnight.  He was to be on timed for thrombolysis and due to concerns about dye allergy CTA was not done.  CT head showed no acute hemorrhage or infarct but age-indeterminate lacunar infarcts involving bilateral basal ganglia, right thalamus and right pons and changes of small vessel disease.  MRI scan showed scattered acute infarcts MR angiogram showed severe stenosis of P2 right posterior cerebral and moderate to severe stenosis of left posterior cerebral in the P2/P3 segments.  2D echo showed ejection fraction of 60 to 65% with mild left ventricular hypertrophy.  TEE showed a large PFO with right-to-left shunting but patient had a low rope score of 4 hence was not considered a candidate for PFO closure.  LDL cholesterol is quite elevated at 244 mg percent.  Hemoglobin A1c was 5.9.  He was started on dual antiplatelet therapy for  3 weeks and then aspirin alone on Lipitor 80 mg.  Patient was not considered a candidate for CLL and was transferred to skilled nursing facility for rehab.  He is still currently living there.  He states he still has significant left-sided weakness and poor balance and is unable to get up and walk by himself.  He is getting outpatient physical occupational therapy about 2 days a week.  He is tolerating aspirin well with only  minor bruising and no bleeding.  Tolerating Lipitor well without muscle aches and pains.  He has not had a follow-up lipid profile checked.  His blood pressure is under good control and today it is 136/78.  He is essentially wheelchair-bound and unable to walk.    ROS:   14 system review of systems is positive for those of [place] in HPI and all other systems negative  PMH:  Past Medical History:  Diagnosis Date   AKI (acute kidney injury) (HCC)    Hypertension    Stroke [facility])     Social History:  Social History   Socioeconomic History   Marital status: Single    Spouse name: Not on file   Number of children: Not on file   Years of education: Not on file   Highest education level: Not on file  Occupational History   Not on file  Tobacco Use   Smoking status: Every Day    Types: Cigarettes   Smokeless tobacco: Never  Substance and Sexual Activity   Alcohol use: Not on file   Drug use: Not on file   Sexual activity: Not on file  Other Topics Concern   Not on file  Social History Narrative   Not on file   Social Determinants of Health   Financial Resource Strain: Not on file  Food Insecurity: Not on file  Transportation Needs: Not on file  Physical Activity: Not on file  Stress: Not on file  Social Connections: Not on file  Intimate Partner Violence: Not on file    Medications:   Current Outpatient Medications on File Prior to Visit  Medication Sig Dispense Refill   amLODipine (NORVASC) 10 MG tablet Take 1 tablet (10 mg total) by mouth daily. 30 tablet 0   atorvastatin (LIPITOR) 80 MG tablet Take 1 tablet (80 mg total) by mouth daily. 30 tablet 0   baclofen (LIORESAL) 20 MG tablet Take 20 mg by mouth 3 (three) times daily.     hydrALAZINE (APRESOLINE) 10 MG tablet Take 1 tablet (10 mg total) by mouth 4 (four) times daily. 30 tablet 0   levothyroxine (SYNTHROID) 100 MCG tablet Take 1 tablet (100 mcg total) by mouth daily at 6 (six) AM. (Patient taking differently: Take  125 mcg by mouth daily at 6 (six) AM.) 30 tablet 0   lisinopril (ZESTRIL) 40 MG tablet Take 1 tablet (40 mg total) by mouth daily. 30 tablet 0   loratadine (CLARITIN) 10 MG tablet Take 1 tablet (10 mg total) by mouth daily. 30 tablet 0   rOPINIRole (REQUIP) 0.25 MG tablet Take 0.25 mg by mouth 3 (three) times daily.     traZODone (DESYREL) 150 MG tablet Take 1 tablet (150 mg total) by mouth at bedtime. (Patient taking differently: Take 100 mg by mouth at bedtime.) 30 tablet 0   No current facility-administered medications on file prior to visit.    Allergies:   Allergies  Allergen Reactions   Ivp Dye [Iodinated Contrast Media] Other (See Comments)    Redness  around neck    Physical Exam Today's Vitals   02/18/22 1350  BP: (!) 160/92  Pulse: 76   There is no height or weight on file to calculate BMI.   General: well developed, well nourished, very pleasant middle-age Caucasian male, seated, in no evident distress Head: head normocephalic and atraumatic.  Neck: supple with no carotid or supraclavicular bruits Cardiovascular: regular rate and rhythm, no murmurs Musculoskeletal: no deformity Skin:  no rash/petichiae Vascular:  Normal pulses all extremities  Neurologic Exam Mental Status: Awake and fully alert.  Mild dysarthria.  Oriented to place and time. Recent and remote memory intact. Attention span, concentration and fund of knowledge appropriate. Mood and affect appropriate.  Cranial Nerves: Pupils equal, briskly reactive to light. Extraocular movements full without nystagmus. Visual fields full to confrontation. Hearing intact. Facial sensation intact.  Left facial weakness.  Tongue, palate moves normally and symmetrically.  Motor: Normal strength and tone right upper and lower extremity LUE: 1/5 deltoid, 2/5 elbow extension, 1/5 elbow flexion, 1/5 hand, increased tone, mild hand swelling LLE: 4/5 HF, KE,2/5 KF, 2/5 ADF, 3/5 APF Sensory.: intact to touch ,pinprick .position  and vibratory sensation.  Coordination: Rapid alternating movements normal on right side. Finger-to-nose and heel-to-shin performed accurately on right side. Gait and Station: Deferred as AD not present Reflexes: 2+ brisk and symmetric. Toes downgoing.       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  Prior history of stroke and TIAs and vascular risk factors of obesity, hypertension, hyperlipidemia and cerebrovascular disease.  He has a PFO but has a low ROPE  score of 4 hence is not a candidate for endovascular closure    -Residual left hemiparesis with increased tone, continue working with PT, has been making gradual recovery since prior visit, advised to further discuss with current physical therapy office if hand therapy can be completed, if not he will call office and new order will be placed for OT -Increase baclofen to 20 mg 4 times daily, is aware of potential side effects and to call if any difficulty tolerating -Restart aspirin 81 mg daily and continue atorvastatin 80 mg daily for secondary stroke prevention -refills provided for all medications until he establishes care with PCP in November - he is aware all refills will need to be obtained by PCP at that time -Encourage routine monitoring of blood pressure at home -order placed for hospital bed due to post stroke left hemiparesis and difficulty being able to sit up when laying flat -form provided for handicap placard  -Ensure close PCP follow-up for aggressive stroke risk factor management including BP goal<130/90, and HLD with LDL goal<70 -Stroke labs: LDL 74 (10/2021), A1c 5.9 (07/2021) -establishes care with PCP in November, will have labs completed at that time    Follow-up in 6 months or call earlier if needed     I spent 38 minutes of face-to-face and  non-face-to-face time with patient and friend.  This included previsit chart review, lab review, study review, order entry, electronic health record documentation, patient education and discussion regarding above topics and treatment plan and answered all the questions to patient and friend satisfaction  Frann Rider, AGNP-BC  [facility] [address] [place], [place] [address]  Phone [phone] Fax [phone] Note: This document was prepared with digital dictation and possible smart phrase technology. Any transcriptional errors that result from this  process are unintentional.

## 2022-02-18 NOTE — Patient Instructions (Addendum)
Increase baclofen to 20mg  four times daily for spasticity   Order placed for hospital bed - you will be contacted to get this set up  Continue working with physical therapy - ask about doing additional therapy focusing on your hand - if they cannot do additional therapy for just your hand, please let me know   Referral placed to orthopedic provider for knee pain  Continue aspirin 81 mg daily  and atorvastatin 80mg  daily  for secondary stroke prevention  Continue to follow up with PCP regarding cholesterol and blood pressure management  Maintain strict control of hypertension with blood pressure goal below 130/90 and cholesterol with LDL cholesterol (bad cholesterol) goal below 70 mg/dL.   Signs of a Stroke? Follow the BEFAST method:  Balance Watch for a sudden loss of balance, trouble with coordination or vertigo Eyes Is there a sudden loss of vision in one or both eyes? Or double vision?  Face: Ask the person to smile. Does one side of the face droop or is it numb?  Arms: Ask the person to raise both arms. Does one arm drift downward? Is there weakness or numbness of a leg? Speech: Ask the person to repeat a simple phrase. Does the speech sound slurred/strange? Is the person confused ? Time: If you observe any of these signs, call 911.      Followup in the future with me in 6 months or call earlier if needed       Thank you for coming to see at [facility]. I hope we have been able to provide you high quality care today.  You may receive a patient satisfaction survey over the next few weeks. We would appreciate your feedback and comments so that we may continue to improve ourselves and the health of our patients.

## 2022-02-23 ENCOUNTER — Telehealth: Payer: Self-pay | Admitting: Adult Health

## 2022-02-23 NOTE — Telephone Encounter (Signed)
Contacted pt due to not receiving response via mychart yet. Need to know if pt has DME he would like orders to go to.

## 2022-02-23 NOTE — Telephone Encounter (Signed)
Referral sent to  [facility] at [facility] [address] [facility], [address] [place], [place] [address] Phone: [phone] Fax: [phone]

## 2022-02-25 NOTE — Telephone Encounter (Signed)
Pt's wife called stating that they do not have any preference on which DME the hospital bed comes from, but she wants to make sure its Electronic not crank up.

## 2022-03-01 NOTE — Telephone Encounter (Signed)
I contacted 3 DMEs in [place] as they would be the closes to pts home. [facility] stated they would take order and to fax it and OV stating reason why pt needs bed to [phone]. Confirmation was received and pt was notified.

## 2022-03-08 ENCOUNTER — Telehealth: Payer: Self-pay | Admitting: Adult Health

## 2022-03-08 DIAGNOSIS — I6340 Cerebral infarction due to embolism of unspecified cerebral artery: Secondary | ICD-10-CM

## 2022-03-08 DIAGNOSIS — G8194 Hemiplegia, unspecified affecting left nondominant side: Secondary | ICD-10-CM

## 2022-03-08 NOTE — Addendum Note (Signed)
Addended by: Frann Rider L on: 03/08/2022 04:14 PM   Modules accepted: Orders

## 2022-03-08 NOTE — Telephone Encounter (Signed)
Contacted Joy back, informed her of Janett Billow recommendations and she stated that was fine. They have agreed to do PT eval

## 2022-03-08 NOTE — Telephone Encounter (Signed)
I did not see mention of pt declining offer. Are you willing to place order for motorized w/c ?

## 2022-03-08 NOTE — Telephone Encounter (Signed)
Order placed to neuro rehab PT to be scheduled for evaluation of motorized wheelchair per patient request.

## 2022-03-08 NOTE — Telephone Encounter (Signed)
Pt states previously he declined a motorized Wheel chair, pt states he has thought about the benefits of having one and would like a Rx to be called in for one.

## 2022-03-08 NOTE — Telephone Encounter (Signed)
I do not recall discussing motorized wheelchair with patient. This is something that needs a full PT evaluation prior to insurance approval. If this is something he wants to proceed with, I can place orders but will need to come to [facility] for this evaluation. Thank you.

## 2022-03-09 ENCOUNTER — Telehealth: Payer: Self-pay | Admitting: Adult Health

## 2022-03-09 NOTE — Telephone Encounter (Signed)
Do you have an update on this referral ?

## 2022-03-09 NOTE — Telephone Encounter (Signed)
Contacted pt back, informed him of referral information Referral sent to  [facility] at [place], [place], [place] [address] Phone: [phone] Fax: [phone] He/spouse was appreciative

## 2022-03-09 NOTE — Telephone Encounter (Signed)
No that's my note for hospital bed, pt was asking about a orthopedic referral

## 2022-03-09 NOTE — Telephone Encounter (Signed)
Pt called wanting to know what is the update on the referral that he was to receive for an orthopedic in order to receive the shots for his arthritis in his L knee. Please advise.

## 2022-03-31 ENCOUNTER — Telehealth: Payer: Self-pay | Admitting: Adult Health

## 2022-03-31 ENCOUNTER — Other Ambulatory Visit: Payer: Self-pay

## 2022-03-31 MED ORDER — HYDRALAZINE HCL 10 MG PO TABS: 10.0000 mg | ORAL_TABLET | Freq: Four times a day (QID) | ORAL | 1 refills | Status: AC

## 2022-03-31 NOTE — Telephone Encounter (Signed)
Refills provided until PCP is established in Nov.

## 2022-03-31 NOTE — Telephone Encounter (Signed)
Pt is calling. Requesting a refill on medication hydrALAZINE (APRESOLINE) 10 MG tablet. Refill should be sent to [facility] [phone]

## 2022-06-02 ENCOUNTER — Ambulatory Visit: Payer: MEDICAID

## 2022-06-08 DIAGNOSIS — Z0271 Encounter for disability determination: Secondary | ICD-10-CM

## 2022-08-03 ENCOUNTER — Other Ambulatory Visit: Payer: Self-pay

## 2022-08-03 MED ORDER — ROPINIROLE HCL 0.25 MG PO TABS: 0.2500 mg | ORAL_TABLET | Freq: Three times a day (TID) | ORAL | 4 refills | Status: AC

## 2022-08-17 NOTE — Telephone Encounter (Signed)
"  Lunsford, Maryjane Hurter, NP [facility],  Could you please submit a new referral for the wheelchair assessment for this patient. The one we have on file will expire on 09/06/22 before we will be able to get him scheduled to come in. There was some confusion with another Dr office, stating that it he had already completed the eval but had not.  If you have any questions or concerns, please let me know.  Thank you"   Will resubmit referral for wheelchair assessment as requested.

## 2022-08-17 NOTE — Addendum Note (Signed)
Addended by: Frann Rider L on: 08/17/2022 10:24 AM   Modules accepted: Orders

## 2022-08-19 ENCOUNTER — Ambulatory Visit: Payer: Medicaid Other | Admitting: Adult Health

## 2022-09-08 ENCOUNTER — Ambulatory Visit: Payer: MEDICAID

## 2022-11-18 ENCOUNTER — Ambulatory Visit: Payer: MEDICAID | Admitting: Physical Therapy

## 2022-11-24 ENCOUNTER — Inpatient Hospital Stay (HOSPITAL_COMMUNITY): Payer: Medicaid Other

## 2022-11-24 ENCOUNTER — Inpatient Hospital Stay (HOSPITAL_COMMUNITY)
Admission: EM | Admit: 2022-11-24 | Discharge: 2022-12-09 | DRG: 870 | Disposition: A | Discharge status: Skilled Nursing Facility | Payer: Medicaid Other | Attending: Internal Medicine | Admitting: Internal Medicine

## 2022-11-24 ENCOUNTER — Encounter (HOSPITAL_COMMUNITY): Payer: Self-pay | Admitting: Internal Medicine

## 2022-11-24 ENCOUNTER — Emergency Department (HOSPITAL_COMMUNITY): Payer: Medicaid Other

## 2022-11-24 ENCOUNTER — Other Ambulatory Visit: Payer: Self-pay

## 2022-11-24 DIAGNOSIS — Z781 Physical restraint status: Secondary | ICD-10-CM | POA: Diagnosis not present

## 2022-11-24 DIAGNOSIS — I69354 Hemiplegia and hemiparesis following cerebral infarction affecting left non-dominant side: Secondary | ICD-10-CM

## 2022-11-24 DIAGNOSIS — N179 Acute kidney failure, unspecified: Secondary | ICD-10-CM | POA: Diagnosis present

## 2022-11-24 DIAGNOSIS — L89152 Pressure ulcer of sacral region, stage 2: Secondary | ICD-10-CM | POA: Diagnosis not present

## 2022-11-24 DIAGNOSIS — E1165 Type 2 diabetes mellitus with hyperglycemia: Secondary | ICD-10-CM | POA: Diagnosis present

## 2022-11-24 DIAGNOSIS — G9340 Encephalopathy, unspecified: Secondary | ICD-10-CM | POA: Diagnosis not present

## 2022-11-24 DIAGNOSIS — R4182 Altered mental status, unspecified: Secondary | ICD-10-CM | POA: Diagnosis present

## 2022-11-24 DIAGNOSIS — R5381 Other malaise: Secondary | ICD-10-CM | POA: Diagnosis present

## 2022-11-24 DIAGNOSIS — J1569 Pneumonia due to other gram-negative bacteria: Secondary | ICD-10-CM | POA: Diagnosis present

## 2022-11-24 DIAGNOSIS — I10 Essential (primary) hypertension: Secondary | ICD-10-CM | POA: Diagnosis present

## 2022-11-24 DIAGNOSIS — R1312 Dysphagia, oropharyngeal phase: Secondary | ICD-10-CM | POA: Diagnosis present

## 2022-11-24 DIAGNOSIS — E039 Hypothyroidism, unspecified: Secondary | ICD-10-CM | POA: Diagnosis present

## 2022-11-24 DIAGNOSIS — J948 Other specified pleural conditions: Secondary | ICD-10-CM | POA: Diagnosis not present

## 2022-11-24 DIAGNOSIS — R7881 Bacteremia: Secondary | ICD-10-CM | POA: Diagnosis not present

## 2022-11-24 DIAGNOSIS — G9341 Metabolic encephalopathy: Secondary | ICD-10-CM | POA: Diagnosis present

## 2022-11-24 DIAGNOSIS — R6521 Severe sepsis with septic shock: Secondary | ICD-10-CM | POA: Diagnosis present

## 2022-11-24 DIAGNOSIS — A408 Other streptococcal sepsis: Secondary | ICD-10-CM | POA: Diagnosis present

## 2022-11-24 DIAGNOSIS — R569 Unspecified convulsions: Secondary | ICD-10-CM

## 2022-11-24 DIAGNOSIS — J9601 Acute respiratory failure with hypoxia: Secondary | ICD-10-CM

## 2022-11-24 DIAGNOSIS — L8990 Pressure ulcer of unspecified site, unspecified stage: Secondary | ICD-10-CM | POA: Insufficient documentation

## 2022-11-24 LAB — MENINGITIS/ENCEPHALITIS PANEL (CSF)
Cryptococcus neoformans/gattii (CSF): NOT DETECTED
Enterovirus (CSF): NOT DETECTED
Escherichia coli K1 (CSF): NOT DETECTED
Human herpesvirus 6 (CSF): NOT DETECTED
Neisseria meningitis (CSF): NOT DETECTED
Streptococcus pneumoniae (CSF): NOT DETECTED
Varicella zoster virus (CSF): NOT DETECTED

## 2022-11-24 LAB — CSF CELL COUNT WITH DIFFERENTIAL
Eosinophils, CSF: 0 % (ref 0–1)
Lymphs, CSF: 30 % — ABNORMAL LOW (ref 40–80)
Other Cells, CSF: 2
RBC Count, CSF: 1315 /mm3 — ABNORMAL HIGH
Segmented Neutrophils-CSF: 83 % — ABNORMAL HIGH (ref 0–6)
Segmented Neutrophils-CSF: 42 % — ABNORMAL HIGH (ref 0–6)
Tube #: 1
Tube #: 4
WBC, CSF: 39 /mm3 (ref 0–5)
WBC, CSF: 9 /mm3 — ABNORMAL HIGH (ref 0–5)

## 2022-11-24 LAB — URINALYSIS, ROUTINE W REFLEX MICROSCOPIC
Bacteria, UA: NONE SEEN
Ketones, ur: NEGATIVE mg/dL
Protein, ur: 30 mg/dL — AB
Specific Gravity, Urine: 1.022 (ref 1.005–1.030)
pH: 5 (ref 5.0–8.0)

## 2022-11-24 LAB — CBC WITH DIFFERENTIAL/PLATELET
Basophils Absolute: 0.1 10*3/uL (ref 0.0–0.1)
Basophils Relative: 1 %
HCT: 46.6 % (ref 39.0–52.0)
Hemoglobin: 15.5 g/dL (ref 13.0–17.0)
Lymphocytes Relative: 16 %
Lymphs Abs: 1.7 10*3/uL (ref 0.7–4.0)
MCH: 30.8 pg (ref 26.0–34.0)
Monocytes Absolute: 0.8 10*3/uL (ref 0.1–1.0)
Monocytes Relative: 7 %
Neutrophils Relative %: 73 %
RBC: 5.03 MIL/uL (ref 4.22–5.81)
RDW: 13.9 % (ref 11.5–15.5)
nRBC: 0 % (ref 0.0–0.2)

## 2022-11-24 LAB — RAPID URINE DRUG SCREEN, HOSP PERFORMED
Amphetamines: NOT DETECTED
Barbiturates: NOT DETECTED
Benzodiazepines: NOT DETECTED
Cocaine: NOT DETECTED

## 2022-11-24 LAB — BLOOD CULTURE ID PANEL (REFLEXED) - BCID2
A.calcoaceticus-baumannii: NOT DETECTED
Candida glabrata: NOT DETECTED
Candida krusei: NOT DETECTED
Candida parapsilosis: NOT DETECTED
Cryptococcus neoformans/gattii: NOT DETECTED
Enterobacter cloacae complex: NOT DETECTED
Enterococcus Faecium: NOT DETECTED
Enterococcus faecalis: NOT DETECTED
Escherichia coli: NOT DETECTED
Klebsiella aerogenes: NOT DETECTED
Klebsiella oxytoca: NOT DETECTED
Klebsiella pneumoniae: NOT DETECTED
Neisseria meningitidis: NOT DETECTED
Proteus species: NOT DETECTED
Pseudomonas aeruginosa: NOT DETECTED
Salmonella species: NOT DETECTED
Serratia marcescens: NOT DETECTED
Staphylococcus aureus (BCID): NOT DETECTED
Staphylococcus epidermidis: NOT DETECTED
Staphylococcus lugdunensis: NOT DETECTED
Staphylococcus species: NOT DETECTED
Streptococcus pneumoniae: NOT DETECTED
Streptococcus pyogenes: NOT DETECTED
Streptococcus species: DETECTED — AB

## 2022-11-24 LAB — PROTEIN, CSF: Total  Protein, CSF: 69 mg/dL — ABNORMAL HIGH (ref 15–45)

## 2022-11-24 LAB — GLUCOSE, CAPILLARY
Glucose-Capillary: 195 mg/dL — ABNORMAL HIGH (ref 70–99)
Glucose-Capillary: 208 mg/dL — ABNORMAL HIGH (ref 70–99)
Glucose-Capillary: 209 mg/dL — ABNORMAL HIGH (ref 70–99)
Glucose-Capillary: 211 mg/dL — ABNORMAL HIGH (ref 70–99)

## 2022-11-24 LAB — I-STAT VENOUS BLOOD GAS, ED
Acid-Base Excess: 1 mmol/L (ref 0.0–2.0)
Bicarbonate: 25.2 mmol/L (ref 20.0–28.0)
Calcium, Ion: 1.07 mmol/L — ABNORMAL LOW (ref 1.15–1.40)
Hemoglobin: 15.6 g/dL (ref 13.0–17.0)
Potassium: 4.3 mmol/L (ref 3.5–5.1)
Sodium: 137 mmol/L (ref 135–145)
pCO2, Ven: 36.9 mmHg — ABNORMAL LOW (ref 44–60)
pH, Ven: 7.442 — ABNORMAL HIGH (ref 7.25–7.43)

## 2022-11-24 LAB — COMPREHENSIVE METABOLIC PANEL
ALT: 52 U/L — ABNORMAL HIGH (ref 0–44)
AST: 29 U/L (ref 15–41)
Albumin: 4.5 g/dL (ref 3.5–5.0)
Alkaline Phosphatase: 105 U/L (ref 38–126)
Anion gap: 16 — ABNORMAL HIGH (ref 5–15)
BUN: 36 mg/dL — ABNORMAL HIGH (ref 6–20)
CO2: 21 mmol/L — ABNORMAL LOW (ref 22–32)
Calcium: 9.7 mg/dL (ref 8.9–10.3)
Chloride: 99 mmol/L (ref 98–111)
GFR, Estimated: 59 mL/min — ABNORMAL LOW (ref >60)
Glucose, Bld: 202 mg/dL — ABNORMAL HIGH (ref 70–99)
Sodium: 136 mmol/L (ref 135–145)
Total Bilirubin: 0.7 mg/dL (ref 0.3–1.2)

## 2022-11-24 LAB — CBG MONITORING, ED: Glucose-Capillary: 211 mg/dL — ABNORMAL HIGH (ref 70–99)

## 2022-11-24 LAB — I-STAT ARTERIAL BLOOD GAS, ED
Acid-Base Excess: 0 mmol/L (ref 0.0–2.0)
Bicarbonate: 25.1 mmol/L (ref 20.0–28.0)
Calcium, Ion: 1.2 mmol/L (ref 1.15–1.40)
HCT: 40 % (ref 39.0–52.0)
Hemoglobin: 13.6 g/dL (ref 13.0–17.0)
Patient temperature: 98.6
Sodium: 138 mmol/L (ref 135–145)
TCO2: 26 mmol/L (ref 22–32)
pCO2 arterial: 40.1 mmHg (ref 32–48)
pH, Arterial: 7.405 (ref 7.35–7.45)
pO2, Arterial: 310 mmHg — ABNORMAL HIGH (ref 83–108)

## 2022-11-24 LAB — HEMOGLOBIN A1C: Hgb A1c MFr Bld: 10.5 % — ABNORMAL HIGH (ref 4.8–5.6)

## 2022-11-24 LAB — LACTIC ACID, PLASMA: Lactic Acid, Venous: 3.8 mmol/L (ref 0.5–1.9)

## 2022-11-24 LAB — ETHANOL: Alcohol, Ethyl (B): <10 mg/dL (ref <10)

## 2022-11-24 LAB — GLUCOSE, CSF: Glucose, CSF: 112 mg/dL — ABNORMAL HIGH (ref 40–70)

## 2022-11-24 LAB — MRSA NEXT GEN BY PCR, NASAL: MRSA by PCR Next Gen: NOT DETECTED

## 2022-11-24 LAB — PHOSPHORUS: Phosphorus: 3.5 mg/dL (ref 2.5–4.6)

## 2022-11-24 LAB — VITAMIN B12: Vitamin B-12: 464 pg/mL (ref 180–914)

## 2022-11-24 LAB — AMMONIA: Ammonia: 20 umol/L (ref 9–35)

## 2022-11-24 LAB — FOLATE: Folate: 18.1 ng/mL (ref >5.9)

## 2022-11-24 MED ORDER — FENTANYL CITRATE PF 50 MCG/ML IJ SOSY
50.0000 ug | PREFILLED_SYRINGE | Freq: Once | INTRAMUSCULAR | Status: AC
  Administered 2022-11-24: 50 ug via INTRAVENOUS
  Filled 2022-11-24: qty 1

## 2022-11-24 MED ORDER — VANCOMYCIN HCL 2000 MG/400ML IV SOLN
2000.0000 mg | Freq: Once | INTRAVENOUS | Status: AC
  Administered 2022-11-24: 2000 mg via INTRAVENOUS
  Filled 2022-11-24: qty 400

## 2022-11-24 MED ORDER — DEXTROSE 5 % IV SOLN
10.0000 mg/kg | Freq: Three times a day (TID) | INTRAVENOUS | Status: DC
  Administered 2022-11-24: 870 mg via INTRAVENOUS
  Filled 2022-11-24 (×2): qty 17.4

## 2022-11-24 MED ORDER — FENTANYL 2500MCG IN NS 250ML (10MCG/ML) PREMIX INFUSION
INTRAVENOUS | Status: AC
  Filled 2022-11-24: qty 250

## 2022-11-24 MED ORDER — FENTANYL BOLUS VIA INFUSION
50.0000 ug | INTRAVENOUS | Status: DC | PRN
  Administered 2022-11-24 – 2022-11-27 (×8): 100 ug via INTRAVENOUS
  Administered 2022-11-28 – 2022-11-29 (×3): 50 ug via INTRAVENOUS

## 2022-11-24 MED ORDER — PANTOPRAZOLE SODIUM 40 MG IV SOLR
40.0000 mg | Freq: Every day | INTRAVENOUS | Status: DC
  Administered 2022-11-25 – 2022-12-02 (×8): 40 mg via INTRAVENOUS
  Filled 2022-11-24 (×8): qty 10

## 2022-11-24 MED ORDER — NOREPINEPHRINE 4 MG/250ML-% IV SOLN
INTRAVENOUS | Status: AC
  Filled 2022-11-24: qty 250

## 2022-11-24 MED ORDER — PROSOURCE TF20 ENFIT COMPATIBL EN LIQD
60.0000 mL | Freq: Two times a day (BID) | ENTERAL | Status: DC
  Administered 2022-11-24 – 2022-11-26 (×4): 60 mL
  Filled 2022-11-24 (×4): qty 60

## 2022-11-24 MED ORDER — SODIUM CHLORIDE 0.9 % IV SOLN
2.0000 g | Freq: Two times a day (BID) | INTRAVENOUS | Status: DC
  Administered 2022-11-24 – 2022-11-25 (×3): 2 g via INTRAVENOUS
  Filled 2022-11-24 (×3): qty 20

## 2022-11-24 MED ORDER — POLYETHYLENE GLYCOL 3350 17 G PO PACK
17.0000 g | PACK | Freq: Every day | ORAL | Status: DC
  Administered 2022-11-25 – 2022-11-27 (×3): 17 g
  Filled 2022-11-24 (×2): qty 1

## 2022-11-24 MED ORDER — HALOPERIDOL LACTATE 5 MG/ML IJ SOLN
5.0000 mg | Freq: Once | INTRAMUSCULAR | Status: AC
  Administered 2022-11-24: 5 mg via INTRAVENOUS
  Filled 2022-11-24: qty 1

## 2022-11-24 MED ORDER — SODIUM CHLORIDE 0.9 % IV SOLN
2.0000 g | Freq: Two times a day (BID) | INTRAVENOUS | Status: DC
  Administered 2022-11-24: 2 g via INTRAVENOUS
  Filled 2022-11-24: qty 20

## 2022-11-24 MED ORDER — POLYETHYLENE GLYCOL 3350 17 G PO PACK: 17.0000 g | PACK | Freq: Every day | ORAL | Status: DC

## 2022-11-24 MED ORDER — VITAL 1.5 CAL PO LIQD
1000.0000 mL | ORAL | Status: DC
  Administered 2022-11-24 – 2022-11-25 (×2): 1000 mL

## 2022-11-24 MED ORDER — VANCOMYCIN HCL 750 MG/150ML IV SOLN
750.0000 mg | Freq: Two times a day (BID) | INTRAVENOUS | Status: DC
  Administered 2022-11-25: 750 mg via INTRAVENOUS
  Filled 2022-11-24 (×2): qty 150

## 2022-11-24 MED ORDER — ETOMIDATE 2 MG/ML IV SOLN
INTRAVENOUS | Status: AC
  Filled 2022-11-24: qty 20

## 2022-11-24 MED ORDER — MIDAZOLAM HCL 2 MG/2ML IJ SOLN
1.0000 mg | INTRAMUSCULAR | Status: DC | PRN
  Administered 2022-11-25: 2 mg via INTRAVENOUS
  Filled 2022-11-24 (×3): qty 2

## 2022-11-24 MED ORDER — INSULIN ASPART 100 UNIT/ML IJ SOLN
0.0000 [IU] | INTRAMUSCULAR | Status: DC
  Administered 2022-11-24: 3 [IU] via SUBCUTANEOUS

## 2022-11-24 MED ORDER — SUCCINYLCHOLINE CHLORIDE 200 MG/10ML IV SOSY: 1.5000 mg/kg | PREFILLED_SYRINGE | Freq: Once | INTRAVENOUS | Status: AC

## 2022-11-24 MED ORDER — SODIUM CHLORIDE 0.9 % IV SOLN: INTRAVENOUS | Status: DC | PRN

## 2022-11-24 MED ORDER — SUCCINYLCHOLINE CHLORIDE 200 MG/10ML IV SOSY
PREFILLED_SYRINGE | INTRAVENOUS | Status: AC
  Administered 2022-11-24: 200 mg via INTRAVENOUS
  Filled 2022-11-24: qty 10

## 2022-11-24 MED ORDER — PROPOFOL 1000 MG/100ML IV EMUL
5.0000 ug/kg/min | INTRAVENOUS | Status: DC
  Administered 2022-11-24: 25 ug/kg/min via INTRAVENOUS
  Administered 2022-11-24: 5 ug/kg/min via INTRAVENOUS
  Administered 2022-11-25: 15 ug/kg/min via INTRAVENOUS
  Administered 2022-11-25: 25 ug/kg/min via INTRAVENOUS
  Administered 2022-11-25: 15 ug/kg/min via INTRAVENOUS
  Administered 2022-11-26: 50 ug/kg/min via INTRAVENOUS
  Administered 2022-11-26: 55 ug/kg/min via INTRAVENOUS
  Administered 2022-11-26: 45 ug/kg/min via INTRAVENOUS
  Administered 2022-11-26: 30 ug/kg/min via INTRAVENOUS
  Administered 2022-11-26: 50 ug/kg/min via INTRAVENOUS
  Administered 2022-11-26: 45 ug/kg/min via INTRAVENOUS
  Administered 2022-11-26 (×2): 65 ug/kg/min via INTRAVENOUS
  Administered 2022-11-27: 60 ug/kg/min via INTRAVENOUS
  Administered 2022-11-27: 65 ug/kg/min via INTRAVENOUS
  Administered 2022-11-27: 60 ug/kg/min via INTRAVENOUS
  Administered 2022-11-27: 80 ug/kg/min via INTRAVENOUS
  Administered 2022-11-27: 60 ug/kg/min via INTRAVENOUS
  Administered 2022-11-27: 65 ug/kg/min via INTRAVENOUS
  Administered 2022-11-27: 60 ug/kg/min via INTRAVENOUS
  Administered 2022-11-27: 65 ug/kg/min via INTRAVENOUS
  Administered 2022-11-27: 50 ug/kg/min via INTRAVENOUS
  Administered 2022-11-28: 40 ug/kg/min via INTRAVENOUS
  Administered 2022-11-28: 30 ug/kg/min via INTRAVENOUS
  Administered 2022-11-28: 50 ug/kg/min via INTRAVENOUS
  Administered 2022-11-28: 40 ug/kg/min via INTRAVENOUS
  Administered 2022-11-28: 5 ug/kg/min via INTRAVENOUS
  Administered 2022-11-29: 20 ug/kg/min via INTRAVENOUS
  Filled 2022-11-24 (×6): qty 100
  Filled 2022-11-24 (×2): qty 200
  Filled 2022-11-24 (×2): qty 100
  Filled 2022-11-24: qty 200
  Filled 2022-11-24 (×2): qty 100
  Filled 2022-11-24: qty 200
  Filled 2022-11-24: qty 100
  Filled 2022-11-24: qty 200
  Filled 2022-11-24 (×8): qty 100

## 2022-11-24 MED ORDER — LACTATED RINGERS IV SOLN: INTRAVENOUS | Status: DC

## 2022-11-24 MED ORDER — FENTANYL CITRATE PF 50 MCG/ML IJ SOSY
50.0000 ug | PREFILLED_SYRINGE | INTRAMUSCULAR | Status: DC | PRN
  Administered 2022-11-24: 100 ug via INTRAVENOUS

## 2022-11-24 MED ORDER — FENTANYL CITRATE PF 50 MCG/ML IJ SOSY
PREFILLED_SYRINGE | INTRAMUSCULAR | Status: AC
  Filled 2022-11-24: qty 2

## 2022-11-24 MED ORDER — DEXAMETHASONE SODIUM PHOSPHATE 4 MG/ML IJ SOLN
4.0000 mg | Freq: Four times a day (QID) | INTRAMUSCULAR | Status: DC
  Administered 2022-11-24 (×2): 4 mg via INTRAVENOUS
  Filled 2022-11-24 (×3): qty 1

## 2022-11-24 MED ORDER — LACTATED RINGERS IV BOLUS (SEPSIS)
1000.0000 mL | Freq: Once | INTRAVENOUS | Status: AC
  Administered 2022-11-24: 1000 mL via INTRAVENOUS

## 2022-11-24 MED ORDER — HEPARIN SODIUM (PORCINE) 5000 UNIT/ML IJ SOLN
5000.0000 [IU] | Freq: Three times a day (TID) | INTRAMUSCULAR | Status: DC
  Administered 2022-11-24 – 2022-12-09 (×45): 5000 [IU] via SUBCUTANEOUS
  Filled 2022-11-24 (×43): qty 1

## 2022-11-24 MED ORDER — FENTANYL CITRATE PF 50 MCG/ML IJ SOSY: 50.0000 ug | PREFILLED_SYRINGE | INTRAMUSCULAR | Status: DC | PRN

## 2022-11-24 MED ORDER — SODIUM CHLORIDE 0.9 % IV SOLN
2.0000 g | INTRAVENOUS | Status: DC
  Administered 2022-11-24 (×2): 2 g via INTRAVENOUS
  Filled 2022-11-24 (×4): qty 2000

## 2022-11-24 MED ORDER — PROPOFOL 1000 MG/100ML IV EMUL
INTRAVENOUS | Status: AC
  Filled 2022-11-24: qty 100

## 2022-11-24 MED ORDER — SODIUM CHLORIDE 0.9 % IV SOLN
100.0000 mg | Freq: Two times a day (BID) | INTRAVENOUS | Status: DC
  Administered 2022-11-24: 100 mg via INTRAVENOUS
  Filled 2022-11-24 (×4): qty 100

## 2022-11-24 MED ORDER — INSULIN ASPART 100 UNIT/ML IJ SOLN
0.0000 [IU] | INTRAMUSCULAR | Status: DC
  Administered 2022-11-24 (×3): 7 [IU] via SUBCUTANEOUS
  Administered 2022-11-25 (×3): 4 [IU] via SUBCUTANEOUS
  Administered 2022-11-25: 7 [IU] via SUBCUTANEOUS
  Administered 2022-11-25: 3 [IU] via SUBCUTANEOUS
  Administered 2022-11-25: 4 [IU] via SUBCUTANEOUS
  Administered 2022-11-26: 11 [IU] via SUBCUTANEOUS
  Administered 2022-11-26: 4 [IU] via SUBCUTANEOUS
  Administered 2022-11-26: 7 [IU] via SUBCUTANEOUS
  Administered 2022-11-26 (×2): 4 [IU] via SUBCUTANEOUS
  Administered 2022-11-27: 11 [IU] via SUBCUTANEOUS
  Administered 2022-11-27: 4 [IU] via SUBCUTANEOUS
  Administered 2022-11-27: 7 [IU] via SUBCUTANEOUS
  Administered 2022-11-27: 4 [IU] via SUBCUTANEOUS
  Administered 2022-11-27: 7 [IU] via SUBCUTANEOUS
  Administered 2022-11-28 (×2): 4 [IU] via SUBCUTANEOUS
  Administered 2022-11-28: 7 [IU] via SUBCUTANEOUS
  Administered 2022-11-28 (×2): 4 [IU] via SUBCUTANEOUS
  Administered 2022-11-28: 7 [IU] via SUBCUTANEOUS
  Administered 2022-11-29: 4 [IU] via SUBCUTANEOUS
  Administered 2022-11-29 (×2): 7 [IU] via SUBCUTANEOUS
  Administered 2022-11-29: 3 [IU] via SUBCUTANEOUS
  Administered 2022-11-29 – 2022-11-30 (×7): 4 [IU] via SUBCUTANEOUS
  Administered 2022-11-30: 7 [IU] via SUBCUTANEOUS
  Administered 2022-12-01 (×4): 4 [IU] via SUBCUTANEOUS
  Administered 2022-12-01: 3 [IU] via SUBCUTANEOUS
  Administered 2022-12-02: 4 [IU] via SUBCUTANEOUS
  Administered 2022-12-02: 3 [IU] via SUBCUTANEOUS
  Administered 2022-12-02: 4 [IU] via SUBCUTANEOUS
  Administered 2022-12-02: 3 [IU] via SUBCUTANEOUS
  Administered 2022-12-02 – 2022-12-03 (×3): 4 [IU] via SUBCUTANEOUS
  Administered 2022-12-03: 7 [IU] via SUBCUTANEOUS
  Administered 2022-12-03: 4 [IU] via SUBCUTANEOUS
  Administered 2022-12-03 (×2): 3 [IU] via SUBCUTANEOUS
  Administered 2022-12-03 – 2022-12-04 (×2): 4 [IU] via SUBCUTANEOUS
  Administered 2022-12-04 – 2022-12-07 (×4): 3 [IU] via SUBCUTANEOUS
  Administered 2022-12-08: 4 [IU] via SUBCUTANEOUS
  Administered 2022-12-08 – 2022-12-09 (×3): 3 [IU] via SUBCUTANEOUS

## 2022-11-24 MED ORDER — ORAL CARE MOUTH RINSE
15.0000 mL | OROMUCOSAL | Status: DC
  Administered 2022-11-24 – 2022-12-01 (×81): 15 mL via OROMUCOSAL

## 2022-11-24 MED ORDER — FENTANYL 2500MCG IN NS 250ML (10MCG/ML) PREMIX INFUSION
0.0000 ug/h | INTRAVENOUS | Status: DC
  Administered 2022-11-24: 50 ug/h via INTRAVENOUS
  Administered 2022-11-24: 175 ug/h via INTRAVENOUS
  Administered 2022-11-25: 100 ug/h via INTRAVENOUS
  Administered 2022-11-26: 200 ug/h via INTRAVENOUS
  Administered 2022-11-26: 50 ug/h via INTRAVENOUS
  Administered 2022-11-27: 150 ug/h via INTRAVENOUS
  Filled 2022-11-24 (×3): qty 250
  Filled 2022-11-24: qty 500

## 2022-11-24 MED ORDER — GADOBUTROL 1 MMOL/ML IV SOLN
10.0000 mL | Freq: Once | INTRAVENOUS | Status: AC | PRN
  Administered 2022-11-24: 10 mL via INTRAVENOUS

## 2022-11-24 MED ORDER — DOCUSATE SODIUM 50 MG/5ML PO LIQD
100.0000 mg | Freq: Two times a day (BID) | ORAL | Status: DC
  Administered 2022-11-24 – 2022-11-30 (×12): 100 mg
  Filled 2022-11-24 (×13): qty 10

## 2022-11-24 MED ORDER — PROPOFOL 1000 MG/100ML IV EMUL: 5.0000 ug/kg/min | INTRAVENOUS | Status: DC

## 2022-11-24 MED ORDER — NOREPINEPHRINE 4 MG/250ML-% IV SOLN
2.0000 ug/min | INTRAVENOUS | Status: DC
  Administered 2022-11-24: 5 ug/min via INTRAVENOUS
  Administered 2022-11-24: 2 ug/min via INTRAVENOUS
  Administered 2022-11-25: 4 ug/min via INTRAVENOUS
  Administered 2022-11-25: 8 ug/min via INTRAVENOUS
  Administered 2022-11-25: 3 ug/min via INTRAVENOUS
  Administered 2022-11-26: 2 ug/min via INTRAVENOUS
  Administered 2022-11-26: 10 ug/min via INTRAVENOUS
  Filled 2022-11-24 (×5): qty 250

## 2022-11-24 MED ORDER — DOCUSATE SODIUM 50 MG/5ML PO LIQD: 100.0000 mg | Freq: Two times a day (BID) | ORAL | Status: DC

## 2022-11-24 MED ORDER — ORAL CARE MOUTH RINSE: 15.0000 mL | OROMUCOSAL | Status: DC | PRN

## 2022-11-24 MED ORDER — SODIUM CHLORIDE 0.9 % IV SOLN: 2.0000 g | INTRAVENOUS | Status: DC

## 2022-11-24 MED ORDER — SODIUM CHLORIDE 0.9 % IV SOLN: 250.0000 mL | INTRAVENOUS | Status: DC

## 2022-11-24 MED ORDER — ETOMIDATE 2 MG/ML IV SOLN
0.3000 mg/kg | Freq: Once | INTRAVENOUS | Status: AC
  Administered 2022-11-24: 20 mg via INTRAVENOUS

## 2022-11-24 MED ORDER — CHLORHEXIDINE GLUCONATE CLOTH 2 % EX PADS
6.0000 | MEDICATED_PAD | Freq: Every day | CUTANEOUS | Status: DC
  Administered 2022-11-24 – 2022-12-09 (×16): 6 via TOPICAL

## 2022-11-24 NOTE — Progress Notes (Signed)
LTM EEG running - no initial skin breakdown - push button tested. [facility] notified.  

## 2022-11-25 ENCOUNTER — Inpatient Hospital Stay (HOSPITAL_COMMUNITY): Payer: Medicaid Other

## 2022-11-25 DIAGNOSIS — R7881 Bacteremia: Secondary | ICD-10-CM

## 2022-11-25 LAB — COMPREHENSIVE METABOLIC PANEL
Albumin: 3.5 g/dL (ref 3.5–5.0)
Alkaline Phosphatase: 83 U/L (ref 38–126)
Anion gap: 12 (ref 5–15)
BUN: 36 mg/dL — ABNORMAL HIGH (ref 6–20)
Calcium: 8.5 mg/dL — ABNORMAL LOW (ref 8.9–10.3)
Chloride: 106 mmol/L (ref 98–111)
Creatinine, Ser: 1.48 mg/dL — ABNORMAL HIGH (ref 0.61–1.24)
GFR, Estimated: 55 mL/min — ABNORMAL LOW (ref >60)
Glucose, Bld: 184 mg/dL — ABNORMAL HIGH (ref 70–99)
Potassium: 4.1 mmol/L (ref 3.5–5.1)
Sodium: 136 mmol/L (ref 135–145)
Total Protein: 6.6 g/dL (ref 6.5–8.1)

## 2022-11-25 LAB — GLUCOSE, CAPILLARY
Glucose-Capillary: 139 mg/dL — ABNORMAL HIGH (ref 70–99)
Glucose-Capillary: 153 mg/dL — ABNORMAL HIGH (ref 70–99)
Glucose-Capillary: 160 mg/dL — ABNORMAL HIGH (ref 70–99)
Glucose-Capillary: 167 mg/dL — ABNORMAL HIGH (ref 70–99)
Glucose-Capillary: 191 mg/dL — ABNORMAL HIGH (ref 70–99)
Glucose-Capillary: 216 mg/dL — ABNORMAL HIGH (ref 70–99)

## 2022-11-25 LAB — ECHOCARDIOGRAM COMPLETE
Area-P 1/2: 2.84 cm2
Height: 71 in
S' Lateral: 3.1 cm
Weight: 3298.08 oz

## 2022-11-25 LAB — CBC
HCT: 37.6 % — ABNORMAL LOW (ref 39.0–52.0)
MCHC: 33.5 g/dL (ref 30.0–36.0)
MCV: 91 fL (ref 80.0–100.0)
RBC: 4.13 MIL/uL — ABNORMAL LOW (ref 4.22–5.81)
WBC: 16.4 10*3/uL — ABNORMAL HIGH (ref 4.0–10.5)

## 2022-11-25 LAB — PHOSPHORUS
Phosphorus: 3 mg/dL (ref 2.5–4.6)
Phosphorus: 2.9 mg/dL (ref 2.5–4.6)

## 2022-11-25 LAB — MAGNESIUM: Magnesium: 2 mg/dL (ref 1.7–2.4)

## 2022-11-25 LAB — RPR: RPR Ser Ql: NONREACTIVE

## 2022-11-25 LAB — PATHOLOGIST SMEAR REVIEW

## 2022-11-25 LAB — T4, FREE: Free T4: 0.7 ng/dL (ref 0.61–1.12)

## 2022-11-25 MED ORDER — MIDAZOLAM HCL (PF) 5 MG/ML IJ SOLN
4.0000 mg | Freq: Once | INTRAMUSCULAR | Status: AC
  Administered 2022-11-25: 4 mg via INTRAVENOUS

## 2022-11-25 MED ORDER — MIDAZOLAM HCL 2 MG/2ML IJ SOLN
2.0000 mg | INTRAMUSCULAR | Status: DC | PRN
  Administered 2022-11-25 – 2022-11-28 (×6): 2 mg via INTRAVENOUS
  Filled 2022-11-25 (×6): qty 2

## 2022-11-25 MED ORDER — MIDAZOLAM HCL 2 MG/2ML IJ SOLN
INTRAMUSCULAR | Status: AC
  Filled 2022-11-25: qty 2

## 2022-11-25 MED ORDER — MIDAZOLAM HCL 2 MG/2ML IJ SOLN
INTRAMUSCULAR | Status: AC
  Administered 2022-11-25: 2 mg
  Filled 2022-11-25: qty 2

## 2022-11-25 MED ORDER — LACTATED RINGERS IV BOLUS
1000.0000 mL | Freq: Once | INTRAVENOUS | Status: AC
  Administered 2022-11-25: 1000 mL via INTRAVENOUS

## 2022-11-26 ENCOUNTER — Inpatient Hospital Stay (HOSPITAL_COMMUNITY): Payer: Medicaid Other

## 2022-11-26 DIAGNOSIS — J9600 Acute respiratory failure, unspecified whether with hypoxia or hypercapnia: Secondary | ICD-10-CM

## 2022-11-26 DIAGNOSIS — N179 Acute kidney failure, unspecified: Secondary | ICD-10-CM

## 2022-11-26 LAB — BASIC METABOLIC PANEL
CO2: 21 mmol/L — ABNORMAL LOW (ref 22–32)
Calcium: 8.6 mg/dL — ABNORMAL LOW (ref 8.9–10.3)
Chloride: 103 mmol/L (ref 98–111)
GFR, Estimated: >60 mL/min (ref >60)
Glucose, Bld: 190 mg/dL — ABNORMAL HIGH (ref 70–99)

## 2022-11-26 LAB — CBC
HCT: 40.2 % (ref 39.0–52.0)
MCH: 30.6 pg (ref 26.0–34.0)
MCHC: 32.3 g/dL (ref 30.0–36.0)
Platelets: 208 10*3/uL (ref 150–400)
RBC: 4.25 MIL/uL (ref 4.22–5.81)
RDW: 14.3 % (ref 11.5–15.5)
WBC: 10.8 10*3/uL — ABNORMAL HIGH (ref 4.0–10.5)
nRBC: 0 % (ref 0.0–0.2)

## 2022-11-26 LAB — GLUCOSE, CAPILLARY
Glucose-Capillary: 182 mg/dL — ABNORMAL HIGH (ref 70–99)
Glucose-Capillary: 220 mg/dL — ABNORMAL HIGH (ref 70–99)
Glucose-Capillary: 112 mg/dL — ABNORMAL HIGH (ref 70–99)
Glucose-Capillary: 155 mg/dL — ABNORMAL HIGH (ref 70–99)

## 2022-11-26 LAB — MISC LABCORP TEST (SEND OUT)

## 2022-11-26 MED ORDER — IPRATROPIUM-ALBUTEROL 0.5-2.5 (3) MG/3ML IN SOLN
3.0000 mL | Freq: Four times a day (QID) | RESPIRATORY_TRACT | Status: DC | PRN
  Administered 2022-11-26 – 2022-11-27 (×2): 3 mL via RESPIRATORY_TRACT
  Filled 2022-11-26: qty 3

## 2022-11-26 MED ORDER — SENNA 8.6 MG PO TABS
1.0000 | ORAL_TABLET | Freq: Every day | ORAL | Status: DC
  Administered 2022-11-26: 8.6 mg
  Filled 2022-11-26: qty 1

## 2022-11-26 MED ORDER — ACETAMINOPHEN 325 MG PO TABS
650.0000 mg | ORAL_TABLET | Freq: Four times a day (QID) | ORAL | Status: DC | PRN
  Administered 2022-11-26 – 2022-12-08 (×14): 650 mg
  Filled 2022-11-26 (×15): qty 2

## 2022-11-26 MED ORDER — BACLOFEN 10 MG PO TABS
10.0000 mg | ORAL_TABLET | Freq: Two times a day (BID) | ORAL | Status: DC
  Administered 2022-11-26 – 2022-12-04 (×17): 10 mg
  Filled 2022-11-26 (×19): qty 1

## 2022-11-26 MED ORDER — VALPROIC ACID 250 MG/5ML PO SOLN
500.0000 mg | Freq: Two times a day (BID) | ORAL | Status: DC
  Administered 2022-11-26 – 2022-11-29 (×6): 500 mg
  Filled 2022-11-26 (×6): qty 10

## 2022-11-26 MED ORDER — GLUCERNA 1.5 CAL PO LIQD
1000.0000 mL | ORAL | Status: DC
  Administered 2022-11-26 – 2022-12-04 (×11): 1000 mL
  Administered 2022-12-04: 237 mL
  Filled 2022-11-26 (×18): qty 1000

## 2022-11-26 MED ORDER — SODIUM CHLORIDE 0.9 % IV SOLN
1.0000 g | INTRAVENOUS | Status: AC
  Administered 2022-11-26 – 2022-11-27 (×2): 1 g via INTRAVENOUS
  Filled 2022-11-26 (×2): qty 10

## 2022-11-26 MED ORDER — SENNOSIDES-DOCUSATE SODIUM 8.6-50 MG PO TABS: 1.0000 | ORAL_TABLET | Freq: Every day | ORAL | Status: DC

## 2022-11-26 MED ORDER — VALPROIC ACID 250 MG/5ML PO SOLN
900.0000 mg | Freq: Once | ORAL | Status: AC
  Administered 2022-11-26: 900 mg
  Filled 2022-11-26: qty 20

## 2022-11-26 MED ORDER — NOREPINEPHRINE 4 MG/250ML-% IV SOLN
0.0000 ug/min | INTRAVENOUS | Status: DC
  Administered 2022-11-27: 8 ug/min via INTRAVENOUS
  Filled 2022-11-26: qty 250

## 2022-11-27 ENCOUNTER — Inpatient Hospital Stay (HOSPITAL_COMMUNITY): Payer: Medicaid Other

## 2022-11-27 ENCOUNTER — Encounter (HOSPITAL_COMMUNITY): Payer: Self-pay | Admitting: Internal Medicine

## 2022-11-27 DIAGNOSIS — J9601 Acute respiratory failure with hypoxia: Secondary | ICD-10-CM

## 2022-11-27 DIAGNOSIS — J9311 Primary spontaneous pneumothorax: Secondary | ICD-10-CM

## 2022-11-27 LAB — PHOSPHORUS: Phosphorus: 4.8 mg/dL — ABNORMAL HIGH (ref 2.5–4.6)

## 2022-11-27 LAB — CBC WITH DIFFERENTIAL/PLATELET
Abs Immature Granulocytes: 0.17 10*3/uL — ABNORMAL HIGH (ref 0.00–0.07)
Eosinophils Absolute: 0.3 10*3/uL (ref 0.0–0.5)
Eosinophils Relative: 3 %
HCT: 37.2 % — ABNORMAL LOW (ref 39.0–52.0)
Hemoglobin: 12 g/dL — ABNORMAL LOW (ref 13.0–17.0)
Immature Granulocytes: 2 %
Lymphocytes Relative: 15 %
Lymphs Abs: 1.7 10*3/uL (ref 0.7–4.0)
MCH: 30.3 pg (ref 26.0–34.0)
MCV: 93.9 fL (ref 80.0–100.0)
Monocytes Absolute: 1.1 10*3/uL — ABNORMAL HIGH (ref 0.1–1.0)
Neutro Abs: 7.7 10*3/uL (ref 1.7–7.7)
Neutrophils Relative %: 69 %
RBC: 3.96 MIL/uL — ABNORMAL LOW (ref 4.22–5.81)
WBC: 11 10*3/uL — ABNORMAL HIGH (ref 4.0–10.5)
nRBC: 0 % (ref 0.0–0.2)

## 2022-11-27 LAB — CSF CULTURE W GRAM STAIN

## 2022-11-27 LAB — BASIC METABOLIC PANEL
Anion gap: 14 (ref 5–15)
CO2: 22 mmol/L (ref 22–32)
Calcium: 8.4 mg/dL — ABNORMAL LOW (ref 8.9–10.3)
GFR, Estimated: 42 mL/min — ABNORMAL LOW (ref >60)
Potassium: 4.7 mmol/L (ref 3.5–5.1)

## 2022-11-27 LAB — GLUCOSE, CAPILLARY
Glucose-Capillary: 267 mg/dL — ABNORMAL HIGH (ref 70–99)
Glucose-Capillary: 228 mg/dL — ABNORMAL HIGH (ref 70–99)
Glucose-Capillary: 205 mg/dL — ABNORMAL HIGH (ref 70–99)
Glucose-Capillary: 223 mg/dL — ABNORMAL HIGH (ref 70–99)
Glucose-Capillary: 216 mg/dL — ABNORMAL HIGH (ref 70–99)

## 2022-11-27 LAB — MISC LABCORP TEST (SEND OUT)

## 2022-11-27 LAB — TRIGLYCERIDES: Triglycerides: 440 mg/dL — ABNORMAL HIGH (ref <150)

## 2022-11-27 LAB — MAGNESIUM: Magnesium: 2.4 mg/dL (ref 1.7–2.4)

## 2022-11-27 MED ORDER — CLONAZEPAM 1 MG PO TABS
1.0000 mg | ORAL_TABLET | Freq: Two times a day (BID) | ORAL | Status: DC
  Administered 2022-11-27 – 2022-11-29 (×5): 1 mg
  Filled 2022-11-27 (×5): qty 1

## 2022-11-27 MED ORDER — POLYETHYLENE GLYCOL 3350 17 G PO PACK
17.0000 g | PACK | Freq: Two times a day (BID) | ORAL | Status: DC
  Filled 2022-11-27: qty 1

## 2022-11-27 MED ORDER — INSULIN ASPART 100 UNIT/ML IJ SOLN
4.0000 [IU] | INTRAMUSCULAR | Status: DC
  Administered 2022-11-27 – 2022-11-28 (×5): 4 [IU] via SUBCUTANEOUS

## 2022-11-27 MED ORDER — QUETIAPINE FUMARATE 100 MG PO TABS
100.0000 mg | ORAL_TABLET | Freq: Two times a day (BID) | ORAL | Status: DC
  Administered 2022-11-27 – 2022-12-07 (×21): 100 mg
  Filled 2022-11-27 (×21): qty 1

## 2022-11-28 ENCOUNTER — Inpatient Hospital Stay (HOSPITAL_COMMUNITY): Payer: Medicaid Other

## 2022-11-28 DIAGNOSIS — J939 Pneumothorax, unspecified: Secondary | ICD-10-CM

## 2022-11-28 LAB — BASIC METABOLIC PANEL
BUN: 30 mg/dL — ABNORMAL HIGH (ref 6–20)
Calcium: 8.4 mg/dL — ABNORMAL LOW (ref 8.9–10.3)
Chloride: 99 mmol/L (ref 98–111)
Creatinine, Ser: 1.18 mg/dL (ref 0.61–1.24)
Glucose, Bld: 218 mg/dL — ABNORMAL HIGH (ref 70–99)
Potassium: 4.3 mmol/L (ref 3.5–5.1)

## 2022-11-28 LAB — CBC WITH DIFFERENTIAL/PLATELET
Abs Immature Granulocytes: 0.16 10*3/uL — ABNORMAL HIGH (ref 0.00–0.07)
Basophils Absolute: 0.1 10*3/uL (ref 0.0–0.1)
HCT: 32.7 % — ABNORMAL LOW (ref 39.0–52.0)
Immature Granulocytes: 2 %
Lymphocytes Relative: 16 %
Lymphs Abs: 1.2 10*3/uL (ref 0.7–4.0)
MCH: 30.2 pg (ref 26.0–34.0)
MCV: 95.1 fL (ref 80.0–100.0)
Monocytes Absolute: 0.7 10*3/uL (ref 0.1–1.0)
Monocytes Relative: 8 %
Neutrophils Relative %: 69 %
RDW: 14.5 % (ref 11.5–15.5)
nRBC: 0 % (ref 0.0–0.2)

## 2022-11-28 LAB — GLUCOSE, CAPILLARY
Glucose-Capillary: 199 mg/dL — ABNORMAL HIGH (ref 70–99)
Glucose-Capillary: 174 mg/dL — ABNORMAL HIGH (ref 70–99)
Glucose-Capillary: 190 mg/dL — ABNORMAL HIGH (ref 70–99)
Glucose-Capillary: 212 mg/dL — ABNORMAL HIGH (ref 70–99)
Glucose-Capillary: 214 mg/dL — ABNORMAL HIGH (ref 70–99)

## 2022-11-28 LAB — MAGNESIUM: Magnesium: 2.5 mg/dL — ABNORMAL HIGH (ref 1.7–2.4)

## 2022-11-28 LAB — PHOSPHORUS: Phosphorus: 2.6 mg/dL (ref 2.5–4.6)

## 2022-11-28 MED ORDER — SODIUM CHLORIDE 0.9% FLUSH: 10.0000 mL | INTRAVENOUS | Status: DC | PRN

## 2022-11-28 MED ORDER — POLYETHYLENE GLYCOL 3350 17 G PO PACK
17.0000 g | PACK | Freq: Every day | ORAL | Status: DC
  Administered 2022-11-29 – 2022-11-30 (×2): 17 g
  Filled 2022-11-28 (×3): qty 1

## 2022-11-28 MED ORDER — SODIUM CHLORIDE 0.9% FLUSH
10.0000 mL | Freq: Two times a day (BID) | INTRAVENOUS | Status: DC
  Administered 2022-11-28 – 2022-11-29 (×2): 10 mL

## 2022-11-28 MED ORDER — INSULIN ASPART 100 UNIT/ML IJ SOLN
6.0000 [IU] | INTRAMUSCULAR | Status: DC
  Administered 2022-11-28 – 2022-11-29 (×6): 6 [IU] via SUBCUTANEOUS

## 2022-11-28 MED ORDER — SODIUM CHLORIDE 0.9 % IV SOLN
2.0000 g | INTRAVENOUS | Status: DC
  Administered 2022-11-28: 2 g via INTRAVENOUS
  Filled 2022-11-28: qty 20

## 2022-11-29 ENCOUNTER — Inpatient Hospital Stay (HOSPITAL_COMMUNITY): Payer: Medicaid Other

## 2022-11-29 DIAGNOSIS — J1569 Pneumonia due to other gram-negative bacteria: Secondary | ICD-10-CM

## 2022-11-29 LAB — GLUCOSE, CAPILLARY
Glucose-Capillary: 188 mg/dL — ABNORMAL HIGH (ref 70–99)
Glucose-Capillary: 217 mg/dL — ABNORMAL HIGH (ref 70–99)
Glucose-Capillary: 194 mg/dL — ABNORMAL HIGH (ref 70–99)
Glucose-Capillary: 173 mg/dL — ABNORMAL HIGH (ref 70–99)

## 2022-11-29 LAB — CBC WITH DIFFERENTIAL/PLATELET
Abs Immature Granulocytes: 0.25 10*3/uL — ABNORMAL HIGH (ref 0.00–0.07)
Basophils Absolute: 0.1 10*3/uL (ref 0.0–0.1)
Basophils Relative: 1 %
Eosinophils Relative: 4 %
HCT: 34.9 % — ABNORMAL LOW (ref 39.0–52.0)
Hemoglobin: 11 g/dL — ABNORMAL LOW (ref 13.0–17.0)
Immature Granulocytes: 3 %
Lymphocytes Relative: 16 %
Lymphs Abs: 1.2 10*3/uL (ref 0.7–4.0)
Platelets: 206 10*3/uL (ref 150–400)
RBC: 3.67 MIL/uL — ABNORMAL LOW (ref 4.22–5.81)
WBC: 8 10*3/uL (ref 4.0–10.5)
nRBC: 0 % (ref 0.0–0.2)

## 2022-11-29 LAB — BASIC METABOLIC PANEL
CO2: 27 mmol/L (ref 22–32)
Calcium: 8.1 mg/dL — ABNORMAL LOW (ref 8.9–10.3)
Chloride: 101 mmol/L (ref 98–111)
Glucose, Bld: 209 mg/dL — ABNORMAL HIGH (ref 70–99)
Potassium: 5 mmol/L (ref 3.5–5.1)

## 2022-11-29 LAB — PHOSPHORUS: Phosphorus: 2.4 mg/dL — ABNORMAL LOW (ref 2.5–4.6)

## 2022-11-29 LAB — CULTURE, BLOOD (ROUTINE X 2): Culture: NO GROWTH

## 2022-11-29 MED ORDER — CLONAZEPAM 0.5 MG PO TABS
0.5000 mg | ORAL_TABLET | Freq: Two times a day (BID) | ORAL | Status: DC
  Administered 2022-11-29 – 2022-11-30 (×3): 0.5 mg
  Filled 2022-11-29 (×3): qty 1

## 2022-11-29 MED ORDER — SODIUM PHOSPHATES 45 MMOLE/15ML IV SOLN
15.0000 mmol | Freq: Once | INTRAVENOUS | Status: AC
  Administered 2022-11-29: 15 mmol via INTRAVENOUS
  Filled 2022-11-29: qty 5

## 2022-11-29 MED ORDER — INSULIN ASPART 100 UNIT/ML IJ SOLN
8.0000 [IU] | INTRAMUSCULAR | Status: DC
  Administered 2022-11-29 – 2022-12-06 (×29): 8 [IU] via SUBCUTANEOUS

## 2022-11-29 MED ORDER — PIPERACILLIN-TAZOBACTAM 3.375 G IVPB
3.3750 g | Freq: Three times a day (TID) | INTRAVENOUS | Status: DC
  Administered 2022-11-29 – 2022-11-30 (×3): 3.375 g via INTRAVENOUS
  Filled 2022-11-29 (×5): qty 50

## 2022-11-29 MED ORDER — LEVOTHYROXINE SODIUM 25 MCG PO TABS
125.0000 ug | ORAL_TABLET | Freq: Every day | ORAL | Status: DC
  Administered 2022-11-29 – 2022-12-06 (×8): 125 ug
  Filled 2022-11-29 (×8): qty 1

## 2022-11-30 ENCOUNTER — Inpatient Hospital Stay (HOSPITAL_COMMUNITY): Payer: Medicaid Other

## 2022-11-30 LAB — COMPREHENSIVE METABOLIC PANEL
ALT: 47 U/L — ABNORMAL HIGH (ref 0–44)
AST: 68 U/L — ABNORMAL HIGH (ref 15–41)
Albumin: 2.5 g/dL — ABNORMAL LOW (ref 3.5–5.0)
Alkaline Phosphatase: 84 U/L (ref 38–126)
Anion gap: 6 (ref 5–15)
BUN: 22 mg/dL — ABNORMAL HIGH (ref 6–20)
CO2: 30 mmol/L (ref 22–32)
Calcium: 8.3 mg/dL — ABNORMAL LOW (ref 8.9–10.3)
GFR, Estimated: >60 mL/min (ref >60)
Potassium: 5.1 mmol/L (ref 3.5–5.1)
Sodium: 137 mmol/L (ref 135–145)
Total Protein: 6.1 g/dL — ABNORMAL LOW (ref 6.5–8.1)

## 2022-11-30 LAB — CBC
HCT: 33.8 % — ABNORMAL LOW (ref 39.0–52.0)
Hemoglobin: 11 g/dL — ABNORMAL LOW (ref 13.0–17.0)
MCH: 31 pg (ref 26.0–34.0)
MCHC: 32.5 g/dL (ref 30.0–36.0)
MCV: 95.2 fL (ref 80.0–100.0)
RBC: 3.55 MIL/uL — ABNORMAL LOW (ref 4.22–5.81)
RDW: 14.6 % (ref 11.5–15.5)
WBC: 9.2 10*3/uL (ref 4.0–10.5)
nRBC: 0 % (ref 0.0–0.2)

## 2022-11-30 LAB — CULTURE, RESPIRATORY W GRAM STAIN

## 2022-11-30 LAB — SPOTTED FEVER GROUP ANTIBODIES
Spotted Fever Group IgG: <1:64 {titer}
Spotted Fever Group IgM: <1:64 {titer}

## 2022-11-30 LAB — MAGNESIUM: Magnesium: 2.5 mg/dL — ABNORMAL HIGH (ref 1.7–2.4)

## 2022-11-30 LAB — PHOSPHORUS: Phosphorus: 2.6 mg/dL (ref 2.5–4.6)

## 2022-11-30 LAB — VITAMIN B1: Vitamin B1 (Thiamine): 130.1 nmol/L (ref 66.5–200.0)

## 2022-11-30 LAB — GLUCOSE, CAPILLARY
Glucose-Capillary: 196 mg/dL — ABNORMAL HIGH (ref 70–99)
Glucose-Capillary: 195 mg/dL — ABNORMAL HIGH (ref 70–99)
Glucose-Capillary: 181 mg/dL — ABNORMAL HIGH (ref 70–99)

## 2022-11-30 MED ORDER — FENTANYL CITRATE PF 50 MCG/ML IJ SOSY
50.0000 ug | PREFILLED_SYRINGE | INTRAMUSCULAR | Status: DC | PRN
  Administered 2022-11-30 – 2022-12-01 (×6): 50 ug via INTRAVENOUS
  Filled 2022-11-30 (×6): qty 1

## 2022-11-30 MED ORDER — DEXMEDETOMIDINE HCL IN NACL 400 MCG/100ML IV SOLN
0.0000 ug/kg/h | INTRAVENOUS | Status: DC
  Administered 2022-11-30: 0.4 ug/kg/h via INTRAVENOUS
  Administered 2022-11-30: 0.6 ug/kg/h via INTRAVENOUS
  Administered 2022-12-01: 1 ug/kg/h via INTRAVENOUS
  Filled 2022-11-30: qty 100
  Filled 2022-11-30: qty 200

## 2022-11-30 MED ORDER — SODIUM CHLORIDE 0.9 % IV SOLN
1.0000 g | INTRAVENOUS | Status: DC
  Administered 2022-11-30 – 2022-12-04 (×5): 1000 mg via INTRAVENOUS
  Filled 2022-11-30 (×6): qty 1

## 2022-11-30 MED ORDER — INSULIN GLARGINE-YFGN 100 UNIT/ML ~~LOC~~ SOLN
10.0000 [IU] | Freq: Every day | SUBCUTANEOUS | Status: DC
  Administered 2022-11-30 – 2022-12-09 (×10): 10 [IU] via SUBCUTANEOUS
  Filled 2022-11-30 (×10): qty 0.1

## 2022-12-01 ENCOUNTER — Inpatient Hospital Stay (HOSPITAL_COMMUNITY): Payer: Medicaid Other

## 2022-12-01 LAB — CBC
MCHC: 31.1 g/dL (ref 30.0–36.0)
MCV: 96.6 fL (ref 80.0–100.0)
RBC: 3.26 MIL/uL — ABNORMAL LOW (ref 4.22–5.81)
RDW: 14.3 % (ref 11.5–15.5)
WBC: 9.6 10*3/uL (ref 4.0–10.5)
nRBC: 0 % (ref 0.0–0.2)

## 2022-12-01 LAB — BASIC METABOLIC PANEL
Anion gap: 7 (ref 5–15)
BUN: 27 mg/dL — ABNORMAL HIGH (ref 6–20)
Calcium: 8.3 mg/dL — ABNORMAL LOW (ref 8.9–10.3)
Chloride: 103 mmol/L (ref 98–111)
Creatinine, Ser: 1.09 mg/dL (ref 0.61–1.24)
Glucose, Bld: 184 mg/dL — ABNORMAL HIGH (ref 70–99)

## 2022-12-01 LAB — GLUCOSE, CAPILLARY
Glucose-Capillary: 168 mg/dL — ABNORMAL HIGH (ref 70–99)
Glucose-Capillary: 193 mg/dL — ABNORMAL HIGH (ref 70–99)
Glucose-Capillary: 166 mg/dL — ABNORMAL HIGH (ref 70–99)

## 2022-12-01 LAB — ARBOVIRUS IGG, CSF
California Enceph IgG: <1:1 {titer}
Eastern eq encephalitis, IgG: <1:1 {titer}
St Louis encephalitis, IgG: <1:1 {titer}
Western eq encephalitis, IgG: <1:1 {titer}

## 2022-12-01 MED ORDER — ORAL CARE MOUTH RINSE: 15.0000 mL | OROMUCOSAL | Status: DC | PRN

## 2022-12-01 MED ORDER — CLONAZEPAM 0.5 MG PO TABS: 0.5000 mg | ORAL_TABLET | Freq: Two times a day (BID) | ORAL | Status: DC | PRN

## 2022-12-01 MED ORDER — OXYCODONE HCL 5 MG PO TABS
5.0000 mg | ORAL_TABLET | ORAL | Status: DC | PRN
  Administered 2022-12-01 – 2022-12-02 (×3): 5 mg
  Filled 2022-12-01 (×3): qty 1

## 2022-12-01 MED ORDER — ORAL CARE MOUTH RINSE
15.0000 mL | OROMUCOSAL | Status: DC
  Administered 2022-12-01 – 2022-12-09 (×28): 15 mL via OROMUCOSAL

## 2022-12-01 MED ORDER — FUROSEMIDE 10 MG/ML IJ SOLN
40.0000 mg | Freq: Once | INTRAMUSCULAR | Status: AC
  Administered 2022-12-01: 40 mg via INTRAVENOUS
  Filled 2022-12-01: qty 4

## 2022-12-01 MED ORDER — OXYCODONE HCL 5 MG PO TABS
5.0000 mg | ORAL_TABLET | ORAL | Status: DC | PRN
  Administered 2022-12-01: 5 mg
  Filled 2022-12-01: qty 1

## 2022-12-02 ENCOUNTER — Inpatient Hospital Stay (HOSPITAL_COMMUNITY): Payer: Medicaid Other

## 2022-12-02 DIAGNOSIS — L8990 Pressure ulcer of unspecified site, unspecified stage: Secondary | ICD-10-CM | POA: Insufficient documentation

## 2022-12-02 DIAGNOSIS — J9601 Acute respiratory failure with hypoxia: Secondary | ICD-10-CM

## 2022-12-02 LAB — CBC
HCT: 35.9 % — ABNORMAL LOW (ref 39.0–52.0)
Hemoglobin: 11.8 g/dL — ABNORMAL LOW (ref 13.0–17.0)
MCH: 31.2 pg (ref 26.0–34.0)
MCHC: 32.9 g/dL (ref 30.0–36.0)
MCV: 95 fL (ref 80.0–100.0)
RBC: 3.78 MIL/uL — ABNORMAL LOW (ref 4.22–5.81)
RDW: 14.1 % (ref 11.5–15.5)

## 2022-12-02 LAB — GLUCOSE, CAPILLARY
Glucose-Capillary: 143 mg/dL — ABNORMAL HIGH (ref 70–99)
Glucose-Capillary: 165 mg/dL — ABNORMAL HIGH (ref 70–99)
Glucose-Capillary: 188 mg/dL — ABNORMAL HIGH (ref 70–99)
Glucose-Capillary: 180 mg/dL — ABNORMAL HIGH (ref 70–99)

## 2022-12-02 LAB — BASIC METABOLIC PANEL
CO2: 28 mmol/L (ref 22–32)
Calcium: 8.7 mg/dL — ABNORMAL LOW (ref 8.9–10.3)
GFR, Estimated: >60 mL/min (ref >60)
Potassium: 3.8 mmol/L (ref 3.5–5.1)
Sodium: 140 mmol/L (ref 135–145)

## 2022-12-02 MED ORDER — HYDROCHLOROTHIAZIDE 25 MG PO TABS
25.0000 mg | ORAL_TABLET | Freq: Every day | ORAL | Status: DC
  Administered 2022-12-02 – 2022-12-04 (×3): 25 mg
  Filled 2022-12-02 (×3): qty 1

## 2022-12-02 MED ORDER — POTASSIUM CHLORIDE 20 MEQ PO PACK
40.0000 meq | PACK | Freq: Once | ORAL | Status: AC
  Administered 2022-12-02: 40 meq
  Filled 2022-12-02: qty 2

## 2022-12-02 MED ORDER — HYDRALAZINE HCL 10 MG PO TABS
10.0000 mg | ORAL_TABLET | Freq: Four times a day (QID) | ORAL | Status: DC
  Administered 2022-12-02 – 2022-12-09 (×28): 10 mg
  Filled 2022-12-02 (×33): qty 1

## 2022-12-02 MED ORDER — LISINOPRIL 20 MG PO TABS
40.0000 mg | ORAL_TABLET | Freq: Every day | ORAL | Status: DC
  Administered 2022-12-02 – 2022-12-04 (×3): 40 mg
  Filled 2022-12-02 (×2): qty 2
  Filled 2022-12-02: qty 4

## 2022-12-02 MED ORDER — OXYCODONE HCL 5 MG PO TABS
2.5000 mg | ORAL_TABLET | Freq: Four times a day (QID) | ORAL | Status: DC | PRN
  Administered 2022-12-02 – 2022-12-06 (×10): 2.5 mg
  Filled 2022-12-02 (×11): qty 1

## 2022-12-03 DIAGNOSIS — R7881 Bacteremia: Secondary | ICD-10-CM

## 2022-12-03 DIAGNOSIS — J9601 Acute respiratory failure with hypoxia: Secondary | ICD-10-CM | POA: Diagnosis not present

## 2022-12-03 DIAGNOSIS — G9340 Encephalopathy, unspecified: Secondary | ICD-10-CM | POA: Diagnosis not present

## 2022-12-03 DIAGNOSIS — R4182 Altered mental status, unspecified: Secondary | ICD-10-CM | POA: Diagnosis not present

## 2022-12-03 LAB — BASIC METABOLIC PANEL
Anion gap: 13 (ref 5–15)
BUN: 32 mg/dL — ABNORMAL HIGH (ref 6–20)
CO2: 25 mmol/L (ref 22–32)
Calcium: 8.8 mg/dL — ABNORMAL LOW (ref 8.9–10.3)
Chloride: 101 mmol/L (ref 98–111)
GFR, Estimated: >60 mL/min (ref >60)
Glucose, Bld: 148 mg/dL — ABNORMAL HIGH (ref 70–99)
Sodium: 139 mmol/L (ref 135–145)

## 2022-12-03 LAB — GLUCOSE, CAPILLARY
Glucose-Capillary: 158 mg/dL — ABNORMAL HIGH (ref 70–99)
Glucose-Capillary: 205 mg/dL — ABNORMAL HIGH (ref 70–99)
Glucose-Capillary: 143 mg/dL — ABNORMAL HIGH (ref 70–99)
Glucose-Capillary: 156 mg/dL — ABNORMAL HIGH (ref 70–99)
Glucose-Capillary: 179 mg/dL — ABNORMAL HIGH (ref 70–99)

## 2022-12-03 LAB — CBC
HCT: 36.9 % — ABNORMAL LOW (ref 39.0–52.0)
MCHC: 32 g/dL (ref 30.0–36.0)
MCV: 93.4 fL (ref 80.0–100.0)
RDW: 14.2 % (ref 11.5–15.5)
WBC: 10.2 10*3/uL (ref 4.0–10.5)
nRBC: 0 % (ref 0.0–0.2)

## 2022-12-03 MED ORDER — ATORVASTATIN CALCIUM 80 MG PO TABS
80.0000 mg | ORAL_TABLET | Freq: Every day | ORAL | Status: DC
  Administered 2022-12-03 – 2022-12-09 (×7): 80 mg via ORAL
  Filled 2022-12-03 (×7): qty 1

## 2022-12-03 MED ORDER — ASPIRIN 81 MG PO TBEC
81.0000 mg | DELAYED_RELEASE_TABLET | Freq: Every day | ORAL | Status: DC
  Administered 2022-12-03 – 2022-12-09 (×7): 81 mg via ORAL
  Filled 2022-12-03 (×7): qty 1

## 2022-12-04 ENCOUNTER — Inpatient Hospital Stay (HOSPITAL_COMMUNITY): Payer: Medicaid Other

## 2022-12-04 DIAGNOSIS — R7881 Bacteremia: Secondary | ICD-10-CM | POA: Diagnosis not present

## 2022-12-04 DIAGNOSIS — G9341 Metabolic encephalopathy: Secondary | ICD-10-CM | POA: Diagnosis not present

## 2022-12-04 LAB — GLUCOSE, CAPILLARY
Glucose-Capillary: 113 mg/dL — ABNORMAL HIGH (ref 70–99)
Glucose-Capillary: 124 mg/dL — ABNORMAL HIGH (ref 70–99)
Glucose-Capillary: 144 mg/dL — ABNORMAL HIGH (ref 70–99)
Glucose-Capillary: 168 mg/dL — ABNORMAL HIGH (ref 70–99)
Glucose-Capillary: 94 mg/dL (ref 70–99)

## 2022-12-04 LAB — CBC WITH DIFFERENTIAL/PLATELET
Abs Immature Granulocytes: 0.26 10*3/uL — ABNORMAL HIGH (ref 0.00–0.07)
Basophils Absolute: 0.1 10*3/uL (ref 0.0–0.1)
HCT: 37.7 % — ABNORMAL LOW (ref 39.0–52.0)
Hemoglobin: 12.2 g/dL — ABNORMAL LOW (ref 13.0–17.0)
Lymphs Abs: 2.1 10*3/uL (ref 0.7–4.0)
MCH: 29.9 pg (ref 26.0–34.0)
MCHC: 32.4 g/dL (ref 30.0–36.0)
RBC: 4.08 MIL/uL — ABNORMAL LOW (ref 4.22–5.81)
WBC: 10.7 10*3/uL — ABNORMAL HIGH (ref 4.0–10.5)
nRBC: 0 % (ref 0.0–0.2)

## 2022-12-04 LAB — BASIC METABOLIC PANEL
Anion gap: 13 (ref 5–15)
BUN: 35 mg/dL — ABNORMAL HIGH (ref 6–20)
CO2: 25 mmol/L (ref 22–32)
Calcium: 8.9 mg/dL (ref 8.9–10.3)
Chloride: 99 mmol/L (ref 98–111)
Creatinine, Ser: 1.13 mg/dL (ref 0.61–1.24)
Glucose, Bld: 159 mg/dL — ABNORMAL HIGH (ref 70–99)
Potassium: 3.8 mmol/L (ref 3.5–5.1)
Sodium: 137 mmol/L (ref 135–145)

## 2022-12-04 LAB — T4, FREE: Free T4: 0.71 ng/dL (ref 0.61–1.12)

## 2022-12-04 LAB — TSH: TSH: 26.746 u[IU]/mL — ABNORMAL HIGH (ref 0.350–4.500)

## 2022-12-04 MED ORDER — BACLOFEN 5 MG HALF TABLET
5.0000 mg | ORAL_TABLET | Freq: Three times a day (TID) | ORAL | Status: DC
  Administered 2022-12-04 – 2022-12-09 (×15): 5 mg via NASOGASTRIC
  Filled 2022-12-04 (×14): qty 1

## 2022-12-05 LAB — PROCALCITONIN: Procalcitonin: 0.19 ng/mL

## 2022-12-05 LAB — BASIC METABOLIC PANEL
BUN: 51 mg/dL — ABNORMAL HIGH (ref 6–20)
Calcium: 8.6 mg/dL — ABNORMAL LOW (ref 8.9–10.3)
Creatinine, Ser: 1.69 mg/dL — ABNORMAL HIGH (ref 0.61–1.24)
GFR, Estimated: 46 mL/min — ABNORMAL LOW (ref >60)
Potassium: 3.8 mmol/L (ref 3.5–5.1)
Sodium: 139 mmol/L (ref 135–145)

## 2022-12-05 LAB — T4, FREE: Free T4: 0.57 ng/dL — ABNORMAL LOW (ref 0.61–1.12)

## 2022-12-05 LAB — BRAIN NATRIURETIC PEPTIDE: B Natriuretic Peptide: 37 pg/mL (ref 0.0–100.0)

## 2022-12-05 LAB — CBC WITH DIFFERENTIAL/PLATELET
Abs Immature Granulocytes: 0.29 10*3/uL — ABNORMAL HIGH (ref 0.00–0.07)
Basophils Absolute: 0.1 10*3/uL (ref 0.0–0.1)
Basophils Relative: 1 %
Immature Granulocytes: 3 %
Lymphocytes Relative: 20 %
Lymphs Abs: 1.9 10*3/uL (ref 0.7–4.0)
MCH: 30.8 pg (ref 26.0–34.0)
Monocytes Absolute: 0.9 10*3/uL (ref 0.1–1.0)
Neutro Abs: 5.7 10*3/uL (ref 1.7–7.7)
Neutrophils Relative %: 61 %
RBC: 3.67 MIL/uL — ABNORMAL LOW (ref 4.22–5.81)
RDW: 14.1 % (ref 11.5–15.5)
nRBC: 0 % (ref 0.0–0.2)

## 2022-12-05 LAB — GLUCOSE, CAPILLARY
Glucose-Capillary: 97 mg/dL (ref 70–99)
Glucose-Capillary: 129 mg/dL — ABNORMAL HIGH (ref 70–99)

## 2022-12-05 LAB — MAGNESIUM: Magnesium: 2.9 mg/dL — ABNORMAL HIGH (ref 1.7–2.4)

## 2022-12-05 LAB — TSH: TSH: 18.675 u[IU]/mL — ABNORMAL HIGH (ref 0.350–4.500)

## 2022-12-05 MED ORDER — LACTATED RINGERS IV SOLN: INTRAVENOUS | Status: AC

## 2022-12-06 ENCOUNTER — Inpatient Hospital Stay (HOSPITAL_COMMUNITY): Payer: Medicaid Other

## 2022-12-06 LAB — CBC WITH DIFFERENTIAL/PLATELET
Basophils Absolute: 0.1 10*3/uL (ref 0.0–0.1)
Eosinophils Absolute: 0.5 10*3/uL (ref 0.0–0.5)
Eosinophils Relative: 7 %
HCT: 36.5 % — ABNORMAL LOW (ref 39.0–52.0)
Hemoglobin: 11.7 g/dL — ABNORMAL LOW (ref 13.0–17.0)
Immature Granulocytes: 3 %
Lymphocytes Relative: 26 %
Lymphs Abs: 2 10*3/uL (ref 0.7–4.0)
MCH: 30.5 pg (ref 26.0–34.0)
MCV: 95.1 fL (ref 80.0–100.0)
Monocytes Relative: 9 %
Neutro Abs: 4.3 10*3/uL (ref 1.7–7.7)
Neutrophils Relative %: 54 %
Platelets: 289 10*3/uL (ref 150–400)
RBC: 3.84 MIL/uL — ABNORMAL LOW (ref 4.22–5.81)
RDW: 13.5 % (ref 11.5–15.5)
WBC: 7.8 10*3/uL (ref 4.0–10.5)

## 2022-12-06 LAB — GLUCOSE, CAPILLARY
Glucose-Capillary: 103 mg/dL — ABNORMAL HIGH (ref 70–99)
Glucose-Capillary: 105 mg/dL — ABNORMAL HIGH (ref 70–99)
Glucose-Capillary: 133 mg/dL — ABNORMAL HIGH (ref 70–99)
Glucose-Capillary: 141 mg/dL — ABNORMAL HIGH (ref 70–99)
Glucose-Capillary: 74 mg/dL (ref 70–99)

## 2022-12-06 LAB — COMPREHENSIVE METABOLIC PANEL
ALT: 71 U/L — ABNORMAL HIGH (ref 0–44)
AST: 47 U/L — ABNORMAL HIGH (ref 15–41)
Albumin: 2.8 g/dL — ABNORMAL LOW (ref 3.5–5.0)
Alkaline Phosphatase: 92 U/L (ref 38–126)
Anion gap: 12 (ref 5–15)
BUN: 40 mg/dL — ABNORMAL HIGH (ref 6–20)
CO2: 22 mmol/L (ref 22–32)
Calcium: 8.8 mg/dL — ABNORMAL LOW (ref 8.9–10.3)
Chloride: 103 mmol/L (ref 98–111)
Creatinine, Ser: 1.11 mg/dL (ref 0.61–1.24)
GFR, Estimated: >60 mL/min (ref >60)
Glucose, Bld: 83 mg/dL (ref 70–99)
Sodium: 137 mmol/L (ref 135–145)
Total Bilirubin: 1 mg/dL (ref 0.3–1.2)
Total Protein: 7.1 g/dL (ref 6.5–8.1)

## 2022-12-06 LAB — PROCALCITONIN: Procalcitonin: 0.11 ng/mL

## 2022-12-06 LAB — BRAIN NATRIURETIC PEPTIDE: B Natriuretic Peptide: 23.9 pg/mL (ref 0.0–100.0)

## 2022-12-06 LAB — MAGNESIUM: Magnesium: 2.4 mg/dL (ref 1.7–2.4)

## 2022-12-06 LAB — C-REACTIVE PROTEIN: CRP: 13.3 mg/dL — ABNORMAL HIGH (ref <1.0)

## 2022-12-06 MED ORDER — ENSURE ENLIVE PO LIQD: 237.0000 mL | Freq: Two times a day (BID) | ORAL | Status: DC

## 2022-12-06 MED ORDER — ADULT MULTIVITAMIN W/MINERALS CH
1.0000 | ORAL_TABLET | Freq: Every day | ORAL | Status: DC
  Administered 2022-12-06 – 2022-12-09 (×4): 1 via ORAL
  Filled 2022-12-06 (×4): qty 1

## 2022-12-06 MED ORDER — LEVOTHYROXINE SODIUM 75 MCG PO TABS
150.0000 ug | ORAL_TABLET | Freq: Every day | ORAL | Status: DC
  Administered 2022-12-07 – 2022-12-09 (×3): 150 ug
  Filled 2022-12-06 (×5): qty 2

## 2022-12-07 ENCOUNTER — Ambulatory Visit: Payer: MEDICAID | Attending: Adult Health | Admitting: Physical Therapy

## 2022-12-07 ENCOUNTER — Telehealth: Payer: Self-pay | Admitting: Physical Therapy

## 2022-12-07 LAB — GLUCOSE, CAPILLARY
Glucose-Capillary: 61 mg/dL — ABNORMAL LOW (ref 70–99)
Glucose-Capillary: 93 mg/dL (ref 70–99)
Glucose-Capillary: 99 mg/dL (ref 70–99)
Glucose-Capillary: 125 mg/dL — ABNORMAL HIGH (ref 70–99)
Glucose-Capillary: 112 mg/dL — ABNORMAL HIGH (ref 70–99)

## 2022-12-07 LAB — COMPREHENSIVE METABOLIC PANEL
ALT: 70 U/L — ABNORMAL HIGH (ref 0–44)
Alkaline Phosphatase: 96 U/L (ref 38–126)
Anion gap: 10 (ref 5–15)
BUN: 29 mg/dL — ABNORMAL HIGH (ref 6–20)
CO2: 24 mmol/L (ref 22–32)
Calcium: 9 mg/dL (ref 8.9–10.3)
Chloride: 104 mmol/L (ref 98–111)
Creatinine, Ser: 1.15 mg/dL (ref 0.61–1.24)
GFR, Estimated: >60 mL/min (ref >60)
Potassium: 4.1 mmol/L (ref 3.5–5.1)
Total Bilirubin: 0.7 mg/dL (ref 0.3–1.2)
Total Protein: 7.6 g/dL (ref 6.5–8.1)

## 2022-12-07 LAB — PROCALCITONIN: Procalcitonin: <0.1 ng/mL

## 2022-12-07 LAB — CBC WITH DIFFERENTIAL/PLATELET
Basophils Absolute: 0.1 10*3/uL (ref 0.0–0.1)
HCT: 37.1 % — ABNORMAL LOW (ref 39.0–52.0)
Hemoglobin: 12.2 g/dL — ABNORMAL LOW (ref 13.0–17.0)
Lymphocytes Relative: 19 %
Lymphs Abs: 1.9 10*3/uL (ref 0.7–4.0)
MCH: 30.2 pg (ref 26.0–34.0)
MCV: 91.8 fL (ref 80.0–100.0)
Monocytes Absolute: 0.8 10*3/uL (ref 0.1–1.0)
Neutro Abs: 6.1 10*3/uL (ref 1.7–7.7)
Neutrophils Relative %: 63 %
Platelets: 439 10*3/uL — ABNORMAL HIGH (ref 150–400)
RBC: 4.04 MIL/uL — ABNORMAL LOW (ref 4.22–5.81)
RDW: 13.2 % (ref 11.5–15.5)
nRBC: 0 % (ref 0.0–0.2)

## 2022-12-07 LAB — C-REACTIVE PROTEIN: CRP: 10.4 mg/dL — ABNORMAL HIGH (ref <1.0)

## 2022-12-07 LAB — MAGNESIUM: Magnesium: 2.3 mg/dL (ref 1.7–2.4)

## 2022-12-07 MED ORDER — HALOPERIDOL LACTATE 5 MG/ML IJ SOLN: 2.0000 mg | Freq: Four times a day (QID) | INTRAMUSCULAR | Status: DC | PRN

## 2022-12-07 NOTE — Telephone Encounter (Signed)
Both front desk and [facility] vendor inform that they called patient and LVM. No further calls indicated at this time but will follow up as indicated.  Jason Figueroa, PT, DPT

## 2022-12-08 DIAGNOSIS — R4182 Altered mental status, unspecified: Secondary | ICD-10-CM | POA: Diagnosis not present

## 2022-12-08 LAB — COMPREHENSIVE METABOLIC PANEL
ALT: 68 U/L — ABNORMAL HIGH (ref 0–44)
AST: 49 U/L — ABNORMAL HIGH (ref 15–41)
Albumin: 3.1 g/dL — ABNORMAL LOW (ref 3.5–5.0)
Alkaline Phosphatase: 93 U/L (ref 38–126)
Anion gap: 12 (ref 5–15)
BUN: 27 mg/dL — ABNORMAL HIGH (ref 6–20)
CO2: 20 mmol/L — ABNORMAL LOW (ref 22–32)
Chloride: 105 mmol/L (ref 98–111)
Glucose, Bld: 117 mg/dL — ABNORMAL HIGH (ref 70–99)
Potassium: 3.8 mmol/L (ref 3.5–5.1)
Sodium: 137 mmol/L (ref 135–145)
Total Bilirubin: 0.7 mg/dL (ref 0.3–1.2)
Total Protein: 8 g/dL (ref 6.5–8.1)

## 2022-12-08 LAB — CBC WITH DIFFERENTIAL/PLATELET
Basophils Relative: 1 %
Eosinophils Relative: 7 %
HCT: 37.7 % — ABNORMAL LOW (ref 39.0–52.0)
Hemoglobin: 12.6 g/dL — ABNORMAL LOW (ref 13.0–17.0)
Lymphs Abs: 2.3 10*3/uL (ref 0.7–4.0)
MCH: 30.1 pg (ref 26.0–34.0)
MCHC: 33.4 g/dL (ref 30.0–36.0)
MCV: 90 fL (ref 80.0–100.0)
Monocytes Absolute: 0.8 10*3/uL (ref 0.1–1.0)
Monocytes Relative: 8 %
Neutrophils Relative %: 59 %
Platelets: 518 10*3/uL — ABNORMAL HIGH (ref 150–400)
RDW: 13.4 % (ref 11.5–15.5)
WBC: 9.9 10*3/uL (ref 4.0–10.5)
nRBC: 0 % (ref 0.0–0.2)

## 2022-12-08 LAB — GLUCOSE, CAPILLARY
Glucose-Capillary: 146 mg/dL — ABNORMAL HIGH (ref 70–99)
Glucose-Capillary: 115 mg/dL — ABNORMAL HIGH (ref 70–99)
Glucose-Capillary: 168 mg/dL — ABNORMAL HIGH (ref 70–99)
Glucose-Capillary: 123 mg/dL — ABNORMAL HIGH (ref 70–99)

## 2022-12-08 LAB — PROCALCITONIN: Procalcitonin: <0.1 ng/mL

## 2022-12-09 DIAGNOSIS — J9601 Acute respiratory failure with hypoxia: Secondary | ICD-10-CM | POA: Diagnosis not present

## 2022-12-09 LAB — COMPREHENSIVE METABOLIC PANEL
ALT: 69 U/L — ABNORMAL HIGH (ref 0–44)
AST: 45 U/L — ABNORMAL HIGH (ref 15–41)
Anion gap: 11 (ref 5–15)
BUN: 30 mg/dL — ABNORMAL HIGH (ref 6–20)
Calcium: 9.1 mg/dL (ref 8.9–10.3)
Chloride: 108 mmol/L (ref 98–111)
GFR, Estimated: >60 mL/min (ref >60)
Glucose, Bld: 112 mg/dL — ABNORMAL HIGH (ref 70–99)
Potassium: 4.2 mmol/L (ref 3.5–5.1)
Sodium: 141 mmol/L (ref 135–145)
Total Bilirubin: 0.7 mg/dL (ref 0.3–1.2)
Total Protein: 7.6 g/dL (ref 6.5–8.1)

## 2022-12-09 LAB — PROCALCITONIN: Procalcitonin: <0.1 ng/mL

## 2022-12-09 LAB — C-REACTIVE PROTEIN: CRP: 5.3 mg/dL — ABNORMAL HIGH (ref <1.0)

## 2022-12-09 LAB — GLUCOSE, CAPILLARY: Glucose-Capillary: 108 mg/dL — ABNORMAL HIGH (ref 70–99)

## 2022-12-09 MED ORDER — INSULIN GLARGINE-YFGN 100 UNIT/ML ~~LOC~~ SOLN: 10.0000 [IU] | Freq: Every day | SUBCUTANEOUS | 0 refills | Status: AC

## 2022-12-09 MED ORDER — INSULIN ASPART 100 UNIT/ML FLEXPEN: PEN_INJECTOR | SUBCUTANEOUS | 0 refills | Status: AC

## 2022-12-09 MED ORDER — BACLOFEN 5 MG PO TABS: 5.0000 mg | ORAL_TABLET | Freq: Two times a day (BID) | ORAL | Status: DC

## 2022-12-09 MED ORDER — LEVOTHYROXINE SODIUM 150 MCG PO TABS: 150.0000 ug | ORAL_TABLET | Freq: Every day | ORAL | Status: AC

## 2022-12-10 ENCOUNTER — Encounter: Payer: Self-pay | Admitting: Adult Health

## 2023-01-05 ENCOUNTER — Other Ambulatory Visit: Payer: Self-pay | Admitting: Adult Health

## 2023-01-05 DIAGNOSIS — I6340 Cerebral infarction due to embolism of unspecified cerebral artery: Secondary | ICD-10-CM

## 2023-01-05 DIAGNOSIS — R269 Unspecified abnormalities of gait and mobility: Secondary | ICD-10-CM

## 2023-01-05 DIAGNOSIS — G8194 Hemiplegia, unspecified affecting left nondominant side: Secondary | ICD-10-CM

## 2023-01-05 NOTE — Progress Notes (Signed)
New order placed for w/c assessment.  Order previously placed but will expire 02/15/2023.  Canceled prior appointment with PT on 3/20 and no showed 6/18. He is currently on wait list.

## 2023-01-11 ENCOUNTER — Other Ambulatory Visit: Payer: Self-pay | Admitting: Anesthesiology

## 2023-03-06 IMAGING — MR MR MRA HEAD W/O CM
2 series · 18 of 48 positions shown · non-contrast
Comparison: Same study from 8 days ago

CLINICAL DATA: Stroke follow-up



[Series 3: ax (id) · axial · 1.0mm · 0.43mm/px · z∈[-46,+38]mm · 16 of 176 slices shown]
[im 1/176]
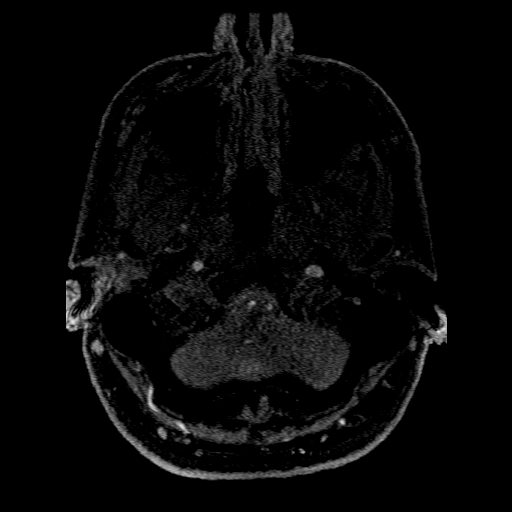
[im 4/176]
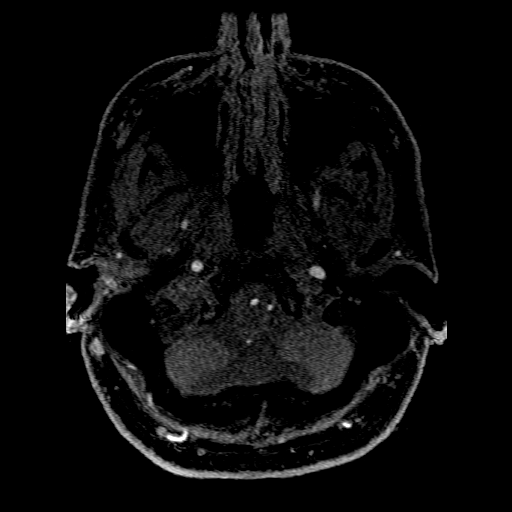
[im 8/176]
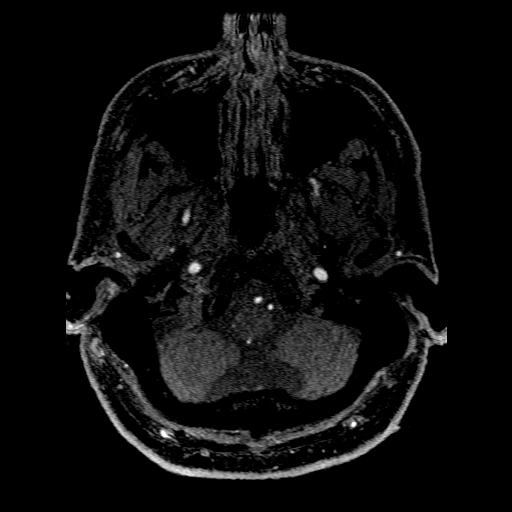
[im 12/176]
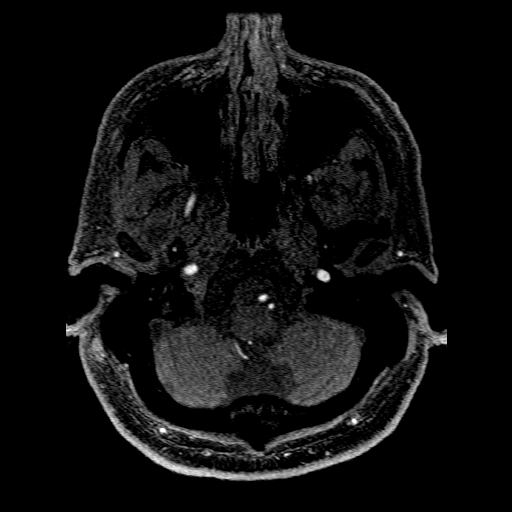
[im 16/176]
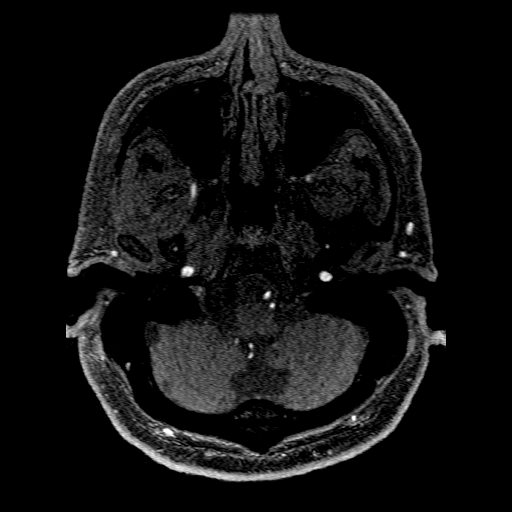
[im 20/176]
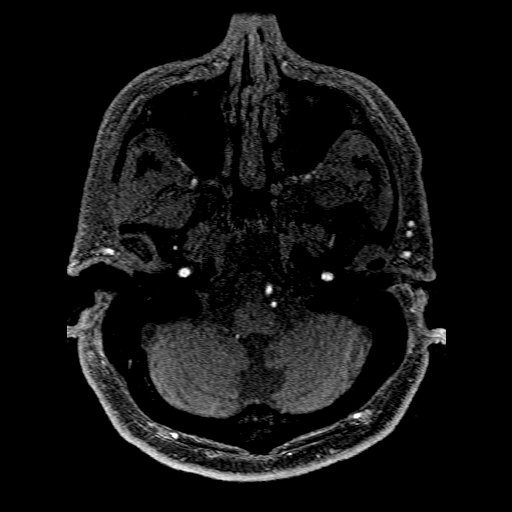
[im 28/176]
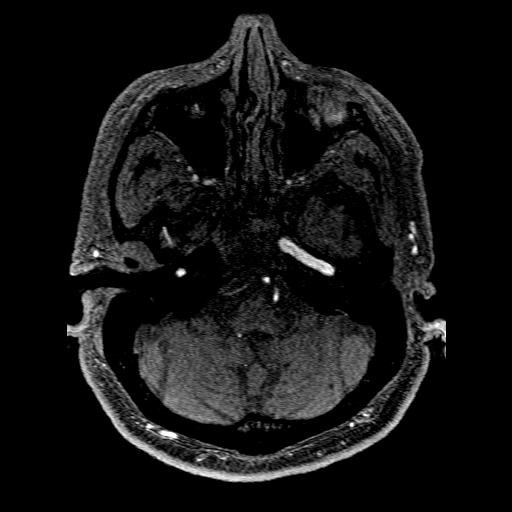
[im 32/176]
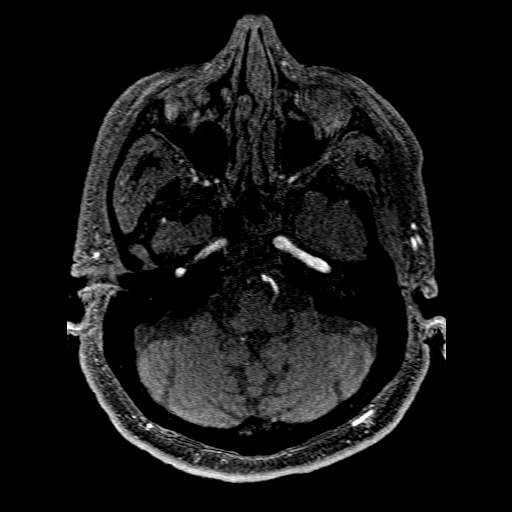
[im 55/176]
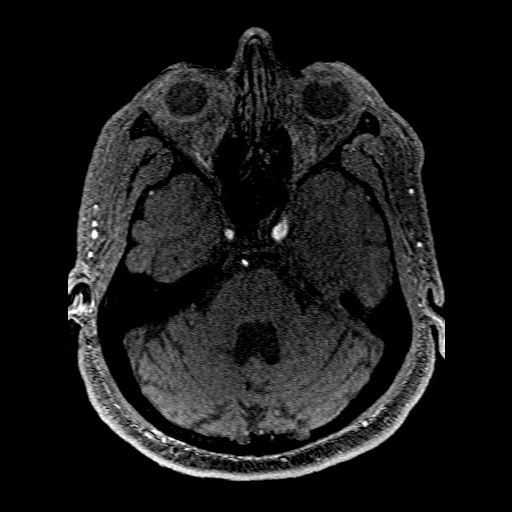
[im 78/176]
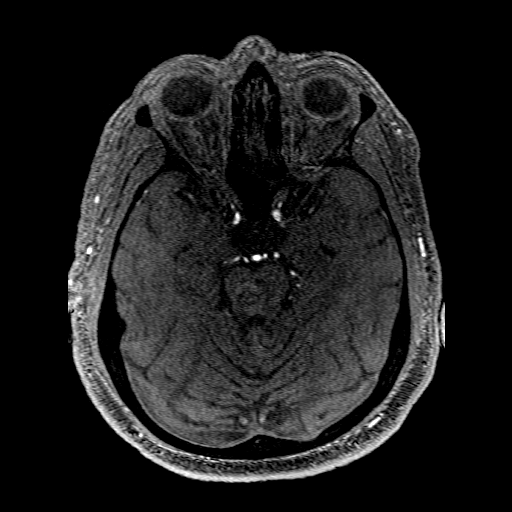
[im 90/176]
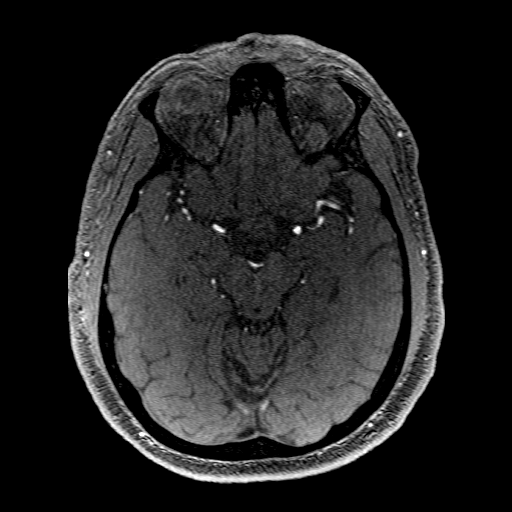
[im 98/176]
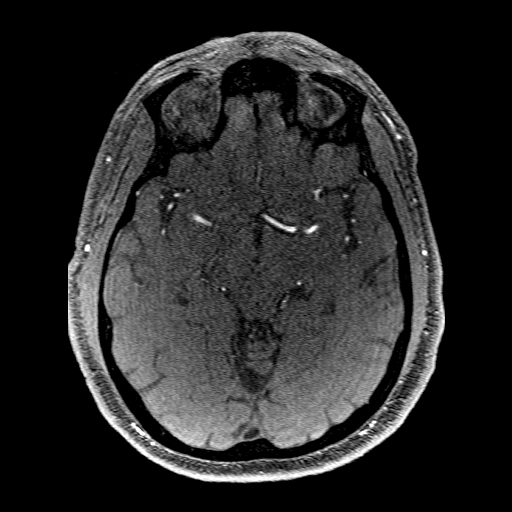
[im 121/176]
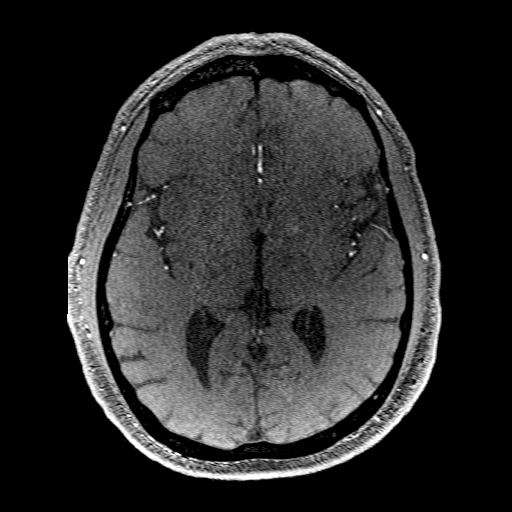
[im 144/176]
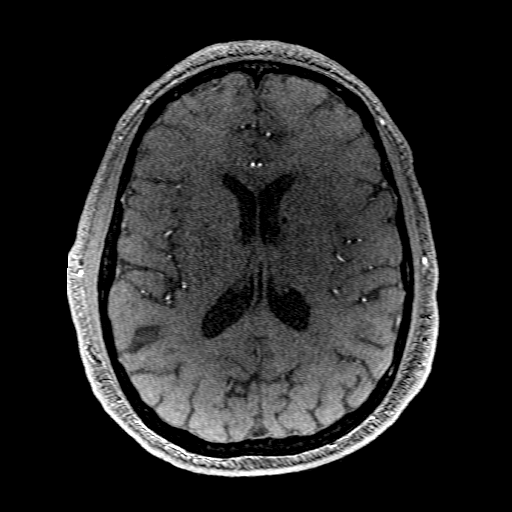
[im 148/176]
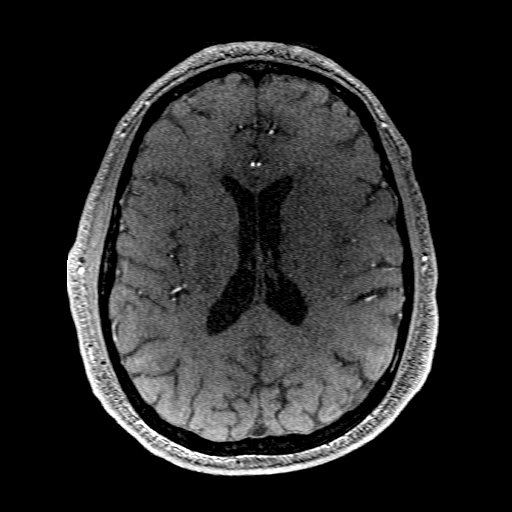
[im 168/176]
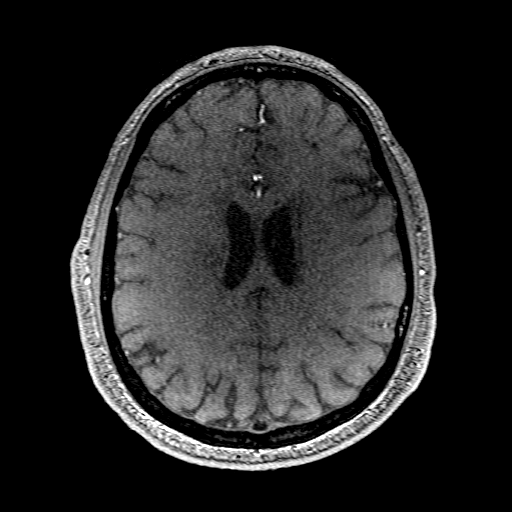

[Series 301: pjn:ax (id) · sagittal · 1.0mm · 0.43mm/px · 2 of 7 slices shown]
[im 1/7]
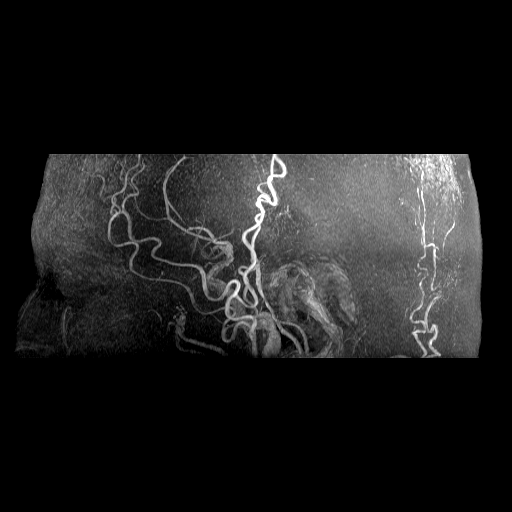
[im 7/7]
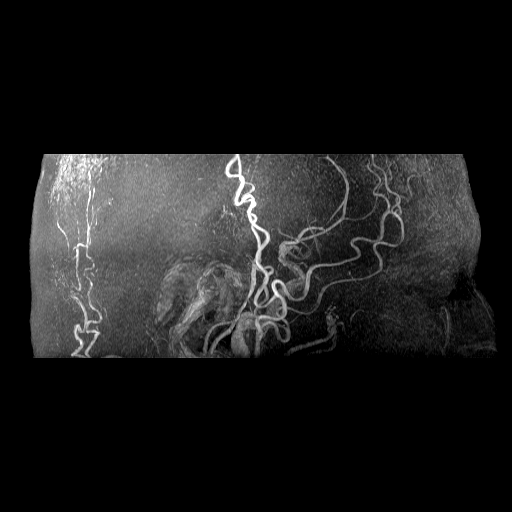

[18 of 48 positions shown; findings below may reference images not displayed]

FINDINGS: MRI HEAD FINDINGS

Brain: New cortically based infarct at the right temporal operculum.
Continued restricted diffusion at the genu of the corpus callosum,
at the right stratum and corona radiata, and weakly within the left
corona radiata. Punctate restricted diffusion at the left anterior
temporal cortex. The right corona radiata infarct is more confluent
than before but in the same distribution. Confluent chronic small
vessel ischemic gliosis in the periventricular white matter with
chronic lacunes in the right pons and bilateral deep gray nuclei.
Low cerebral volume for age, especially in the cerebellum. No
chronic blood products, acute hemorrhage, hydrocephalus, or
collection.

Vascular: Arterial findings below.  Normal dural venous flow voids.

Skull and upper cervical spine: Normal marrow signal

Sinuses/Orbits: No acute finding

MRA HEAD FINDINGS

The right ICA is smaller than the left in the setting of right A1
aplasia. Superimposed atheromatous irregularity on the right more
than left cavernous carotids with 50% narrowing at the right
paraclinoid segment. Negative for branch occlusion, beading, or
aneurysm. No proximal flow limiting stenosis.

Fairly symmetric vertebral arteries in the posterior circulation.
Atheromatous undulation of vertebral and basilar arteries with up to
50% narrowing at the proximal basilar. Advanced right P2 segment
stenosis with flow gap. Bilateral posterior cerebral branches show
high-grade narrowing. Negative for aneurysm.

MRA NECK FINDINGS

Unremarkable arch where covered. The left vertebral artery is either
early branching or arises from the aorta. Antegrade flow in the
carotid and vertebral arteries bilaterally. Mild plaque is likely at
the carotid bifurcations. Where not distorted by motion, the
vertebral arteries are smoothly contoured and widely patent.
IMPRESSION: Brain MRI:

1. Small acute cortical infarct at the right temporal operculum.
2. Re-identified pre-existing infarcts. T he right basal ganglia
infarct became more confluent but is in the same distribution.
3. Chronic small vessel ischemia with chronic lacunar infarcts.
Chronic small left frontal cortex infarct.

Intracranial MRA:

1. No interval or emergent findings.
2. Intracranial atherosclerosis with 50% narrowing of the right ICA
and proximal basilar.
3. Advanced bilateral PCA stenoses, more proximal in the right where
it affects the P2 segment.

Neck MRA:

Common artifact seen with noncontrast neck MRA. No evidence of
stenosis or interval change.

## 2023-12-21 ENCOUNTER — Encounter: Payer: Self-pay | Admitting: Physical Medicine & Rehabilitation

## 2024-01-02 ENCOUNTER — Encounter: Payer: Self-pay | Admitting: Physical Medicine & Rehabilitation

## 2024-01-02 ENCOUNTER — Encounter: Attending: Physical Medicine & Rehabilitation | Admitting: Physical Medicine & Rehabilitation

## 2024-01-02 VITALS — BP 160/99 | HR 64

## 2024-01-02 DIAGNOSIS — M25562 Pain in left knee: Secondary | ICD-10-CM | POA: Insufficient documentation

## 2024-01-02 DIAGNOSIS — R252 Cramp and spasm: Secondary | ICD-10-CM | POA: Diagnosis not present

## 2024-01-02 DIAGNOSIS — I6340 Cerebral infarction due to embolism of unspecified cerebral artery: Secondary | ICD-10-CM | POA: Diagnosis present

## 2024-01-02 DIAGNOSIS — M21372 Foot drop, left foot: Secondary | ICD-10-CM | POA: Insufficient documentation

## 2024-01-02 MED ORDER — GABAPENTIN 100 MG PO CAPS: 100.0000 mg | ORAL_CAPSULE | Freq: Three times a day (TID) | ORAL | 3 refills | Status: DC

## 2024-01-02 NOTE — Progress Notes (Signed)
 Subjective:    Patient ID: Jason Figueroa, male    DOB: 1966/03/17, 58 y.o.   MRN: [id number]  HPI HPI  Jason Figueroa is a 58 y.o. year old male  who  has a past medical history of AKI (acute kidney injury) (HCC), Hypertension, and Stroke (HCC).   They are presenting to PM&R clinic as a new patient for pain management evaluation. They were referred by Dr. Debby Blanch for treatment of muscle spasms.  Thank you for the referral of this very pleasant patient.  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  Appears this occurred in 2023 February.  Patient is accompanied by a male family member.  Patient reports he has been having a pain in left leg for the last couple years.  Pain is reported as pulling or cramping, however he also says it is difficult to really describe accurately.  He uses a percussion/vibration massage device that helps reduce his pain.  Patient reports that his pain is not associated with visible muscle spasms, muscle tightness or difficulty moving the joints of his legs.  Pain is worse at night makes it difficult for him to sleep.  Sometimes pain improves as he is ambulating with his walker.  Patient does have muscle tightness in his left arm greater than left leg.  Patient reports baclofen  pump is being considered.  Patient also drags his left foot sometimes when he walks.   Red flag symptoms: No red flags for back pain endorsed in Hx or ROS  Medications tried: Patient reports he has not tried many medications although it appears ropinirole  may have been used without reported benefit. Patient reports he had a reaction to Lyrica  in the past?  Other treatments: PT-denies for this condition Massage device is helpful   Prior UDS results:     Component Value Date/Time   LABOPIA NONE DETECTED 11/24/2022 0311   COCAINSCRNUR NONE DETECTED 11/24/2022 0311   LABBENZ NONE DETECTED 11/24/2022 0311    AMPHETMU NONE DETECTED 11/24/2022 0311   THCU NONE DETECTED 11/24/2022 0311   LABBARB NONE DETECTED 11/24/2022 0311      Pain Inventory Average Pain 8 Pain Right Now 0 My pain is sharp  LOCATION OF PAIN  Shoulder, leg  BOWEL Number of stools per week: 3 Oral laxative use Yes  Type of laxative stool softener Enema or suppository use No  History of colostomy No  Incontinent No   BLADDER Normal In and out cath, frequency No Able to self cath N/A Bladder incontinence No  Frequent urination No  Leakage with coughing No  Difficulty starting stream No  Incomplete bladder emptying No    Mobility use a cane how many minutes can you walk? 5 minutes ability to climb steps?  yes do you drive?  no use a wheelchair needs help with transfers  Function disabled: date disabled 07/10/2021  Neuro/Psych bowel control problems trouble walking spasms  Prior Studies Any changes since last visit?  no  Physicians involved in your care Primary care Debby Blanch, MD   Family History  Problem Relation Age of Onset   Diverticulitis Mother    Alcoholism Father    Alcoholism Sister    Social History   Socioeconomic History   Marital status: Unknown    Spouse name: Not on file   Number of children: Not on file   Years of education: Not on file   Highest education level:  Not on file  Occupational History   Not on file  Tobacco Use   Smoking status: Not on file   Smokeless tobacco: Never  Substance and Sexual Activity   Alcohol use: Not on file   Drug use: Not on file   Sexual activity: Not on file  Other Topics Concern   Not on file  Social History Narrative   ** Merged History Encounter **       Social Drivers of Health   Financial Resource Strain: Not on file  Food Insecurity: Low Risk  (01/12/2023)   Received from [facility]   Hunger Vital Sign    Within the past 12 months, you worried that your food would run out before you got money to buy more: Never  true    Within the past 12 months, the food you bought just didn't last and you didn't have money to get more. : Never true  Transportation Needs: Not on file (01/12/2023)  Recent Concern: Transportation Needs - Unmet Transportation Needs (11/28/2022)   PRAPARE - Administrator, Civil Service (Medical): Yes    Lack of Transportation (Non-Medical): Yes  Physical Activity: Not on file  Stress: Not on file  Social Connections: Not on file   Past Surgical History:  Procedure Laterality Date   BACK SURGERY  1991   lumbar surgery   BUBBLE STUDY  07/28/2021   Procedure: BUBBLE STUDY;  Surgeon: Raford Riggs, MD;  Location: [facility] ENDOSCOPY;  Service: Cardiovascular;;   TEE WITHOUT CARDIOVERSION N/A 07/28/2021   Procedure: TRANSESOPHAGEAL ECHOCARDIOGRAM (TEE);  Surgeon: Raford Riggs, MD;  Location: [facility] ENDOSCOPY;  Service: Cardiovascular;  Laterality: N/A;   Past Medical History:  Diagnosis Date   AKI (acute kidney injury) (HCC)    Hypertension    Stroke (HCC)    BP (!) 160/99 (BP Location: Right Arm, Patient Position: Sitting, Cuff Size: Large) Comment: Second BP  Pulse 64   SpO2 93%   Opioid Risk Score:   Fall Risk Score:  `1  Depression screen PHQ 2/9      No data to display            Review of Systems  Endocrine:       Hyperglycemic  Musculoskeletal:        Shoulder pain  Neurological:  Positive for weakness and numbness.       Leg spasms, left arm numbness  All other systems reviewed and are negative.      Objective:   Physical Exam  Gen: no distress, normal appearing HEENT: oral mucosa pink and moist, NCAT Chest: normal effort, normal rate of breathing Abd: soft, non-distended Ext: no edema Psych: pleasant, normal affect Skin: intact Neuro: Alert and awake, follows commands, cranial nerves II through XII grossly intact RUE: 5/5 Deltoid, 5/5 Biceps, 5/5 Triceps, 5/5 Wrist Ext, 5/5 Grip LUE: 1/5 Deltoid, 1/5 Biceps, 1/5 Triceps, 1/5 Wrist Ext,  2/5 Grip RLE: HF 5/5, KE 5/5, ADF 5/5, APF 5/5 LLE: HF 45, KE 4/5, ADF 1-2/5, APF 1-2/5 Sensory exam normal for light touch and pain in all 4 limbs.   No abnormal tone noted in right upper extremity or right lower extremity Hypertonia noted in left upper extremity greater than left lower extremity MAS 3 elbow extensors, 2-3 elbow flexors, 3 finger flexors MAS 1+ hip and knee extensors and flexors Musculoskeletal:  Sitting in wheelchair Fingers flexed at baseline left upper extremity No significant joint swelling or tenderness noted in his left lower extremity, no  significant pain with ROM     Assessment & Plan:   1) Multiple CVA hemorrhage with left hemiparesis, upper extremity affected more than lower extremity 2) Left upper extremity greater than left lower extremity hemiparesis 3) Left lower extremity pain 4) Left foot drop  - Patient with chronic left leg pain that occurs mostly at night.  Suspect this could be neuropathic pain but muscle spasms/spasticity could also be contributing -Will start with gabapentin  100 mg 3 times daily.  Suspect pain may have neuropathic component.  Chart review indicates gabapentin  listed as having potential anxiety/agitation with this medication.  Patient and family indicates he tolerated this outpatient when he took it a few years ago in the past.  He thinks anxiety may have happened when he was delirious in the hospital.  Will start at low-dose. -Could also consider trying muscle relaxer at a later time to see if this would help with his left leg pain.  This may be contributing to his pain also.  His spasticity appears to be primarily affecting his left upper extremity.  Could consider Botox of the left upper extremity however patient indicates currently spasticity is not causing him significant discomfort or any functional limitations. -Order for left AFO

## 2024-01-19 NOTE — Telephone Encounter (Signed)
 Spoke with pt's mom and she states  that patient's BP is 196/121 right now, has been running high for the past 3 days. States he is having spasms arms and legs, which is causing his left arm to keep drawing up for the past 3 days as well. Spoke with Dr. Magdaline and he recommends the pt to go to the ED. Pt and his mother were made of aware and they voiced understanding.

## 2024-01-25 NOTE — Progress Notes (Signed)
 Jason CHRISTELLA Blanch, MD, FAAFP   [facility]  - PRIMARY CARE [place] [facility] [address] [place] [address] Dept: [phone]  Dept Fax: [phone]  [name] DOB: 1966/03/02  Encounter Date: 01/25/2024    Chief Complaint  Patient presents with  . Follow-up  . Transitional Care Visit  . Hypertension  . Diabetes  . Cerebrovascular Accident  . Spasticity  . Medication Management    Assessment/Plan   1. Hypertension, essential   2. Elevated blood pressure reading in office with diagnosis of hypertension   3. 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 Type 2 diabetes mellitus with hyperglycemia, without long-term current use of insulin     (CMD)   5. Muscle spasticity   6. Nasal spray abuse (CMD)   7. Pharmacologic therapy   8. Variable compliance with medication therapy   9. Gait disturbance   10. Gastroesophageal reflux disease without esophagitis    PLAN: Patient Instructions  Continue physical medicine visit as scheduled in September. May need Botox injection. Obtain Ankle Foot Orthosis as planned  (AFO splint).  Office BP is elevated. Home BP evaluation is recommended. Continue current BP medication(s). Normal home BP at rest should be 130/80 or less.  Increase to hydralazine  50 mg three times daily.  Increase to metformin ER 500 mg twice daily prior to food.  Praised on stopping nasal spray abuse.  Lab evaluation today.  Diabetic eye, foot, and diet care recommended. Fasting sugar goal of 80-150 range is the goal.        Scheduled Future Appointments       Provider Department Dept Phone Center   04/10/2024 2:15 PM Jason Figueroa [facility]  - Primary Care [facility] [place] [facility] [phone] [facility] [address]       Subjective  Jason Figueroa is a 58 y.o. male who presents for  Chief Complaint  Patient presents with  .  Follow-up  . Transitional Care Visit  . Hypertension  . Diabetes  . Cerebrovascular Accident  . Spasticity  . Medication Management  .  Issues addressed by patient and physician today include: Transitional Care Visit  PROBLEM #1:  Accelerated HTN. Hospitalized with BP 220/115. NO new CVA found.   Increased to hydralazine  50 mg bid, lisinopril  10 mg qday, added amlodipine  5 mg.  Office BP remains elevated to 160/98.  Mother manages medications.  PROBLEM #2:  DM2.  GERD.  Nose Spray Abuse. Stopped Afrin nasal spray.  Stopped vaping. Glucose remains elevated after steroids given during hospitalization in setting of known contrast dye allergy.  Pre-hospital A1c was 7.4%.  Only taking metformin 500 mg ONCE daily. Taking famotidine 40 mg for pyrosis after meals.    PROBLEM #3:  Hemiparesis.  Spasticity. Muscle spasms persist in LUE/LLE.  Stopped gabapentin  due to lack of efficacy. Planning PMR visit in September.  May need Botox injection for spasticity in LLE/LUE after CVA.  Has not yet obtained AFO.   Review of Systems  Constitutional:  Negative for fatigue, fever and unexpected weight change.  HENT:  Negative for trouble swallowing.   Respiratory:  Negative for shortness of breath.   Cardiovascular:  Negative for chest pain.  Gastrointestinal:  Negative for abdominal distention (pyrosis after some foods) and abdominal pain.  Genitourinary:  Negative for dysuria.  Musculoskeletal:  Negative for gait problem.  Skin:  Negative for wound.  Neurological:  Positive  for weakness. Negative for tremors (endorses muscle spasm LUE/LLE) and seizures.  Psychiatric/Behavioral:  Negative for confusion and decreased concentration.    Objective  BP (!) 160/98 (BP Location: Left arm, Patient Position: Sitting)   Pulse 68   Temp 97.7 F (36.5 C) (Temporal)   Ht 1.753 m (5' 9)   SpO2 97%   BMI 31.22 kg/m  BP Readings from Last 3 Encounters:  01/25/24 (!) 160/98  12/13/23 138/78  09/29/23  139/89   Pulse Readings from Last 3 Encounters:  01/25/24 68  12/13/23 73  09/29/23 68   Wt Readings from Last 3 Encounters:  12/13/23 95.9 kg (211 lb 6.4 oz)  10/13/22 89.4 kg (197 lb)  08/09/22 90.3 kg (199 lb)   Ht Readings from Last 3 Encounters:  01/25/24 1.753 m (5' 9)  12/13/23 1.753 m (5' 9)  09/29/23 1.753 m (5' 9)   Body mass index is 31.22 kg/m. No LMP for male patient.  PHYSICAL EXAM: GENERAL APPEARANCE: Well appearing, well developed, overweight, NAD NECK: Supple without lymphadenopathy or thyromegaly LUNGS: Clear to auscultation bilaterally HEART: Regular rate and rhythm. EXTREMITIES: Without clubbing, cyanosis, or edema NEUROLOGIC: Alert and oriented x 3.  Answered questions appropriately.  Dense flaccid paresis LUE.  LLE with 50% weakness.  Using a wheelchair.  Brother accompanies.  Labs: No visits with results within 7 Day(s) from this visit.  Latest known visit with results is:  Office Visit on 09/29/2023  Component Date Value Ref Range Status  . Sodium 09/29/2023 140  136 - 145 mmol/L Final  . Potassium 09/29/2023 4.7  3.5 - 5.1 mmol/L Final   NO VISIBLE HEMOLYSIS  . Chloride 09/29/2023 104  98 - 107 mmol/L Final  . CO2 09/29/2023 28  21 - 31 mmol/L Final  . Anion Gap 09/29/2023 8  6 - 14 mmol/L Final  . Glucose, Random 09/29/2023 202 (H)  70 - 99 mg/dL Final  . Blood Urea Nitrogen (BUN) 09/29/2023 20  7 - 25 mg/dL Final  . Creatinine 95/89/7974 1.07  0.70 - 1.30 mg/dL Final  . eGFR 95/89/7974 80  >59 mL/min/1.52m2 Final   GFR estimated by CKD-EPI equations(NKF 2021).   Recommend confirmation of Cr-based eGFR by using Cys-based eGFR and other filtration markers (if applicable) in complex cases and clinical decision-making, as needed.  . Albumin 09/29/2023 4.4  3.5 - 5.7 g/dL Final  . Total Protein 09/29/2023 7.4  6.4 - 8.9 g/dL Final  . Bilirubin, Total 09/29/2023 0.7  0.3 - 1.0 mg/dL Final  . Alkaline Phosphatase (ALP) 09/29/2023 107 (H)  34  - 104 U/L Final  . Aspartate Aminotransferase (AST) 09/29/2023 20  13 - 39 U/L Final  . Alanine Aminotransferase (ALT) 09/29/2023 39  7 - 52 U/L Final  . Calcium  09/29/2023 9.8  8.6 - 10.3 mg/dL Final  . BUN/Creatinine Ratio 09/29/2023    Final   Creatinine is normal, ratio is not clinically indicated.   . Hemoglobin A1c 09/29/2023 7.4 (H)  <5.7 % Final   Normal A1c:             Less than 5.7%  Prediabetes range A1c:  5.7% to 6.4%  Diabetes range A1c:     Greater than 6.4%  . Estimated Average Glucose 09/29/2023 166  mg/dL Final   The ADA has supported the calculation of Average Glucose (eAG) based on HbA1c measurements. However, the eAG reflects the average glycemic level over 2-3 months and it would not necessarily match single glucose laboratory measurements,  nor reflect changes in the daily glucose concentration.  . Cholesterol, Total, Lipid Panel 09/29/2023 196  <200 mg/dL Final   NCEP III Guidelines   Total Cholesterol             Risk Classification                           <200 mg/dL                      Desirable    200-239 mg/dL                   Borderline High    >240 mg/dL                      High  . Triglycerides, Lipid Panel 09/29/2023 374 (H)  <150 mg/dL Final   NCEP-ATP III Guidelines    Triglyceride Value           Risk Classification                           <150 mg/dL                   Normal    150-199 mg/dL                Borderline High    200-499 mg/dL                High    >=500 mg/dL                  Very High   . HDL Cholesterol - Lipid Panel 09/29/2023 46 (L)  >=60 mg/dL Final   NCEP III Guidelines    HDLc                     Risk Classification                           >=60 mg/dL                  Optimal    >=40 mg/dL                  Desirable    <40 mg/dL                   Low      . LDL Cholesterol, Calculated 09/29/2023 100 (H)  <100 mg/dL Final   LDL Cholesterol is calculated using the Martin/Hopkins equation. Source:  JAMA 2013; 310:   2061-68. NCEP-ATP III Guidelines    LDLc                     Risk Classification                           <100 mg/dL                   Optimal    100-129 mg/dL                Desirable    130-159 mg/dL                Borderline High    160-189 mg/dL  High    >190 mg/dL                   Very High   . Non-HDL Cholesterol 09/29/2023 150  mg/dL Final  . TSH 95/89/7974 2.112  0.450 - 5.330 uIU/mL Final  . WBC 09/29/2023 7.60  4.40 - 11.00 10*3/uL Final  . RBC 09/29/2023 4.87  4.50 - 5.90 10*6/uL Final  . Hemoglobin 09/29/2023 14.7  14.0 - 17.5 g/dL Final  . Hematocrit 95/89/7974 43.7  41.5 - 50.4 % Final  . Mean Corpuscular Volume (MCV) 09/29/2023 89.8  80.0 - 96.0 fL Final  . Mean Corpuscular Hemoglobin (MCH) 09/29/2023 30.1  27.5 - 33.2 pg Final  . Mean Corpuscular Hemoglobin Conc (* 09/29/2023 33.6  33.0 - 37.0 g/dL Final  . Red Cell Distribution Width (RDW) 09/29/2023 14.2  12.3 - 17.0 % Final  . Platelet Count (PLT) 09/29/2023 218  150 - 450 10*3/uL Final  . Mean Platelet Volume (MPV) 09/29/2023 8.0  6.8 - 10.2 fL Final    Orders Placed This Encounter  Medications  . hydrALAZINE  (APRESOLINE ) 50 mg tablet    Sig: Take 1 tablet (50 mg total) by mouth 3 (three) times a day.    Dispense:  300 tablet    Refill:  3  . metFORMIN (GLUCOPHAGE-XR) 500 mg 24 hr tablet    Sig: Take 1 tablet (500 mg total) by mouth in the morning and 1 tablet (500 mg total) in the evening. Take before meals.    Dispense:  180 tablet    Refill:  3  . lisinopriL  (PRINIVIL ) 10 mg tablet    Sig: Take 1 tablet (10 mg total) by mouth nightly.    Dispense:  90 tablet    Refill:  3    Orders Placed This Encounter  Procedures  . Comprehensive Metabolic Panel  . CBC with Differential  . CBC with Differential    Requested Prescriptions   Signed Prescriptions Disp Refills  . hydrALAZINE  (APRESOLINE ) 50 mg tablet 300 tablet 3    Sig: Take 1 tablet (50 mg total) by mouth 3 (three) times a  day.  . metFORMIN (GLUCOPHAGE-XR) 500 mg 24 hr tablet 180 tablet 3    Sig: Take 1 tablet (500 mg total) by mouth in the morning and 1 tablet (500 mg total) in the evening. Take before meals.  . lisinopriL  (PRINIVIL ) 10 mg tablet 90 tablet 3    Sig: Take 1 tablet (10 mg total) by mouth nightly.     Medications Discontinued During This Encounter  Medication Reason  . aspirin  81 mg EC tablet Dose adjustment  . hydrALAZINE  (APRESOLINE ) 10 mg tablet Dose adjustment  . lisinopriL  (PRINIVIL ) 5 mg tablet Dose adjustment  . hydrALAZINE  (APRESOLINE ) 50 mg tablet Dose adjustment  . metFORMIN (GLUCOPHAGE-XR) 500 mg 24 hr tablet Dose adjustment  . lisinopriL  (PRINIVIL ) 10 mg tablet Reorder      Problem List[1]  Health Maintenance Status       Date Due Completion Dates   Diabetes:  Quantitative uACR for Kidney Evaluation Never done ---   Comprehensive Annual Visit Never done ---   Diabetes: Retinopathy Screening Combo Never done ---   HIV Screening Never done ---   Hepatitis C Screening Never done ---   Pneumococcal Vaccine for Ages 50+ (1 of 2 - PCV) Never done ---   DTaP/Tdap/Td Vaccines (1 - Tdap) Never done ---   Hepatitis B Vaccines (1 of 3 - 19+ 3-dose series) Never done ---  Colorectal Cancer Screening Never done ---   ZOSTER VACCINE (1 of 2) Never done ---   COVID-19 Vaccine (1 - 2024-25 season) Never done ---   Influenza Vaccine (1) 01/20/2024 04/18/2023, 05/03/2022   Depression Monitoring 06/13/2024 12/13/2023   Diabetes:  eGFR for Kidney Evaluation 09/28/2024 09/29/2023, 04/18/2023   Diabetes: Foot Exam 09/28/2024 09/29/2023, 09/29/2023   Diabetes: Hemoglobin A1C 09/28/2024 09/29/2023, 09/29/2023   Adult RSV (60+ Years or Pregnancy) (1 - 1-dose 75+ series) 06/21/2039 ---        Immunization History  Administered Date(s) Administered  . Influenza, Injectable, Quadrivalent, Preservative Free 05/03/2022  . Influenza,split virus, trivalent, PF 04/18/2023    Surgical  History[2]  Family History[3]  Tobacco Use History[4] Social History   Substance and Sexual Activity  Alcohol Use Not Currently   Social History   Substance and Sexual Activity  Drug Use Not Currently    Allergies[5]   Jason CHRISTELLA Blanch, MD, FAAFP  Encounter Date: 01/25/2024       [1] Patient Active Problem List Diagnosis  . Left-sided weakness  . Stroke (HCC)  . OSA (obstructive sleep apnea)  . Hypertension, essential  . Hemiparesis of left nondominant side as late effect of cerebrovascular disease (HCC)  . History of completed stroke  . Muscle spasm of left lower extremity  . Former heavy cigarette smoker (20-39 per day)  . Gait disturbance  . Anemia, normocytic normochromic  . Primary osteoarthritis of left knee  . Elevated blood pressure reading in office with diagnosis of hypertension  . Pharmacologic therapy  . Variable compliance with medication therapy  . Type 2 diabetes mellitus with hyperglycemia, without long-term current use of insulin     (CMD)  . Gastroesophageal reflux disease without esophagitis  . Class 1 obesity due to excess calories with serious comorbidity and body mass index (BMI) of 31.0 to 31.9 in adult  . Muscle spasticity  . Nasal spray abuse (CMD)  . Nicotine addiction  [2] Past Surgical History: Procedure Laterality Date  . BACK SURGERY  04/1990   Procedure: BACK SURGERY  . SKIN GRAFT  1989   Procedure: SKIN GRAFT  [3] Family History Problem Relation Name Age of Onset  . Hyperlipidemia Mother    . Hypertension Mother    . Cancer Maternal Grandmother         bone  . Hypertension Maternal Grandfather    . Stroke Maternal Grandfather    [4] Social History Tobacco Use  Smoking Status Former  Smokeless Tobacco Former  [5] Allergies Allergen Reactions  . Iodinated Contrast Media Other (See Comments)    Redness around neck

## 2024-02-27 ENCOUNTER — Encounter: Attending: Physical Medicine & Rehabilitation | Admitting: Physical Medicine & Rehabilitation

## 2024-02-27 ENCOUNTER — Encounter: Payer: Self-pay | Admitting: Physical Medicine & Rehabilitation

## 2024-02-27 VITALS — BP 162/83 | HR 63 | Ht 71.0 in

## 2024-02-27 DIAGNOSIS — R252 Cramp and spasm: Secondary | ICD-10-CM | POA: Diagnosis present

## 2024-02-27 DIAGNOSIS — I6340 Cerebral infarction due to embolism of unspecified cerebral artery: Secondary | ICD-10-CM | POA: Insufficient documentation

## 2024-02-27 MED ORDER — TIZANIDINE HCL 2 MG PO CAPS: 2.0000 mg | ORAL_CAPSULE | Freq: Three times a day (TID) | ORAL | 0 refills | Status: DC

## 2024-02-27 NOTE — Progress Notes (Signed)
 Subjective:    Patient ID: Jason Figueroa, male    DOB: 1966/04/16, 58 y.o.   MRN: [id number]  HPI   Jason Figueroa is a 58 y.o. year old male  who  has a past medical history of AKI (acute kidney injury) (HCC), Hypertension, and Stroke (HCC).   They are presenting to PM&R clinic as a new patient for pain management evaluation. They were referred by Dr. Debby Blanch for treatment of muscle spasms.  Thank you for the referral of this very pleasant patient.  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  Appears this occurred in 2023 February.  Patient is accompanied by a male family member.  Patient reports he has been having a pain in left leg for the last couple years.  Pain is reported as pulling or cramping, however he also says it is difficult to really describe accurately.  He uses a percussion/vibration massage device that helps reduce his pain.  Patient reports that his pain is not associated with visible muscle spasms, muscle tightness or difficulty moving the joints of his legs.  Pain is worse at night makes it difficult for him to sleep.  Sometimes pain improves as he is ambulating with his walker.  Patient does have muscle tightness in his left arm greater than left leg.  Patient reports baclofen  pump is being considered.  Patient also drags his left foot sometimes when he walks.   Red flag symptoms: No red flags for back pain endorsed in Hx or ROS  Medications tried: Patient reports he has not tried many medications although it appears ropinirole  may have been used without reported benefit. Patient reports he had a reaction to Lyrica  in the past?  Other treatments: PT-denies for this condition Massage device is helpful   Prior UDS results:     Component Value Date/Time   LABOPIA NONE DETECTED 11/24/2022 0311   COCAINSCRNUR NONE DETECTED 11/24/2022 0311   LABBENZ NONE DETECTED 11/24/2022 0311    AMPHETMU NONE DETECTED 11/24/2022 0311   THCU NONE DETECTED 11/24/2022 0311   LABBARB NONE DETECTED 11/24/2022 0311    Interval History 02/27/24 Reports nocturnal left lower extremity pains, described as a pulling and painful sensation. Spasms are severe enough to disrupt sleep, keeping him up all night. They typically resolve around 7 AM. During the day, pain is infrequent.    Pain/spasms may be associated visible foot dorsiflexion and toe curling.  Although patient says he does not see them so is not sure if this is truly going on.  Reports no benefit with gabapentin .  Pain is located in the calf, both anteriorly and posteriorly. The pain can also travel up the leg. Reports some pain episodes/spasms in the left shoulder as well, which can cause the arm to pull upwards. Getting up and walking, or using a massager, provides temporary relief. Restless legs are also reported but described as a different sensation. Denies any history of liver disease.  History of past stroke affecting the left side.  Pain Inventory Average Pain 3 Pain Right Now 0 My pain is sharp  In the last 24 hours, has pain interfered with the following? General activity 6 Relation with others 1 Enjoyment of life 1 What TIME of day is your pain at its worst? varies Sleep (in general) NA  Pain is worse with: sitting Pain improves with: therapy/exercise Relief from Meds: 0    Family History  Problem  Relation Age of Onset   Diverticulitis Mother    Alcoholism Father    Alcoholism Sister    Social History   Socioeconomic History   Marital status: Unknown    Spouse name: Not on file   Number of children: Not on file   Years of education: Not on file   Highest education level: Not on file  Occupational History   Not on file  Tobacco Use   Smoking status: Unknown   Smokeless tobacco: Never  Substance and Sexual Activity   Alcohol use: Not on file   Drug use: Not on file   Sexual activity: Not on file   Other Topics Concern   Not on file  Social History Narrative   ** Merged History Encounter **       Social Drivers of Health   Financial Resource Strain: Not on file  Food Insecurity: Low Risk  (01/12/2023)   Received from [facility]   Hunger Vital Sign    Within the past 12 months, you worried that your food would run out before you got money to buy more: Never true    Within the past 12 months, the food you bought just didn't last and you didn't have money to get more. : Never true  Transportation Needs: Not on file (01/12/2023)  Recent Concern: Transportation Needs - Unmet Transportation Needs (11/28/2022)   PRAPARE - Administrator, Civil Service (Medical): Yes    Lack of Transportation (Non-Medical): Yes  Physical Activity: Not on file  Stress: Not on file  Social Connections: Not on file   Past Surgical History:  Procedure Laterality Date   BACK SURGERY  1991   lumbar surgery   BUBBLE STUDY  07/28/2021   Procedure: BUBBLE STUDY;  Surgeon: Raford Riggs, MD;  Location: [facility] ENDOSCOPY;  Service: Cardiovascular;;   TEE WITHOUT CARDIOVERSION N/A 07/28/2021   Procedure: TRANSESOPHAGEAL ECHOCARDIOGRAM (TEE);  Surgeon: Raford Riggs, MD;  Location: [facility] ENDOSCOPY;  Service: Cardiovascular;  Laterality: N/A;   Past Medical History:  Diagnosis Date   AKI (acute kidney injury) (HCC)    Hypertension    Stroke (HCC)    BP (!) 193/95   Pulse 63   Ht 5' 11 (1.803 m)   SpO2 96%   BMI 26.11 kg/m   Opioid Risk Score:   Fall Risk Score:  `1  Depression screen [facility] 2/9     02/27/2024   10:52 AM 01/02/2024    1:43 PM  Depression screen PHQ 2/9  Decreased Interest 2 2  Down, Depressed, Hopeless 0 0  PHQ - 2 Score 2 2  Altered sleeping  3  Tired, decreased energy  3  Change in appetite  2  Trouble concentrating  0  Moving slowly or fidgety/restless  0  Suicidal thoughts  0  PHQ-9 Score  10  Difficult doing work/chores  Not difficult at all      Review of  Systems  Endocrine:       Hyperglycemic  Musculoskeletal:  Positive for gait problem.       Left leg pain  Neurological:  Positive for weakness and numbness.       Leg spasms, left arm numbness  All other systems reviewed and are negative.      Objective:   Physical Exam  Gen: no distress, normal appearing HEENT: oral mucosa pink and moist, NCAT Chest: normal effort, normal rate of breathing Abd: soft, non-distended Ext: no edema Psych: pleasant, normal affect Skin: intact  Neuro: Alert and awake, follows commands, cranial nerves II through XII grossly intact RUE: 5/5 Deltoid, 5/5 Biceps, 5/5 Triceps, 5/5 Wrist Ext, 5/5 Grip LUE: 1/5 Deltoid, 1/5 Biceps, 1/5 Triceps, 1/5 Wrist Ext, 2/5 Grip RLE: HF 5/5, KE 5/5, ADF 5/5, APF 5/5 LLE: HF 4/5, KE 4/5, ADF 1-2/5, APF 1-2/5 Sensory exam normal for light touch and pain in all 4 limbs.   Left upper extremity MAS 2-3 elbow extensors, 2-3 elbow flexors, 3 finger flexors MAS 1+ hip and 1-2 knee extensors and flexors MAS 2 ankle plantar flexors and dorsiflexors Musculoskeletal:  Sitting in wheelchair Fingers flexed at baseline left upper extremity No significant joint swelling or tenderness noted in his left lower extremity, no significant pain with ROM     Assessment & Plan:   1) Multiple CVA hemorrhage with left hemiparesis, upper extremity affected more than lower extremity 2) Left upper extremity greater than left lower extremity hemiparesis 3) Left lower extremity pain 4) Left foot drop  - Patient with chronic left leg pain that occurs mostly at night.  Likely related to muscle spasms versus neuropathic pain.  Patient has difficulty describing if there is an associated muscle contraction in addition to subjective pain. - Patient denied benefit with gabapentin  100 mg, he discontinued using the medication.   -Chart review indicates gabapentin  listed as having potential anxiety/agitation with this medication.  Patient and family  indicates he tolerated this outpatient when he took it a few years ago in the past.  He thinks anxiety may have happened when he was delirious in the hospital.   -Start tizanidine  2 mg. Take one capsule at night for three days. If tolerated, may increase to twice daily. If still tolerated, may increase to three times daily. Counseled that it is another muscle relaxer and can cause sleepiness. Prescription sent to [facility] on [address] -Will schedule for Botox left lower extremity.  Discussed this as a potential future treatment if tizanidine  is not effective or not tolerated. This would involve injections into the affected muscles every three months in the office. Encouraged to observe and note which specific muscles are spasming if possible to help guide future treatments  -Left AFO ordered prior visit

## 2024-04-09 ENCOUNTER — Encounter: Payer: Self-pay | Admitting: Physical Medicine & Rehabilitation

## 2024-04-09 ENCOUNTER — Encounter: Attending: Physical Medicine & Rehabilitation | Admitting: Physical Medicine & Rehabilitation

## 2024-04-09 VITALS — BP 167/97 | HR 71 | Ht 71.0 in

## 2024-04-09 DIAGNOSIS — R252 Cramp and spasm: Secondary | ICD-10-CM | POA: Diagnosis present

## 2024-04-09 DIAGNOSIS — M25562 Pain in left knee: Secondary | ICD-10-CM | POA: Insufficient documentation

## 2024-04-09 MED ORDER — DOXYCYCLINE HYCLATE 100 MG PO TABS: 100.0000 mg | ORAL_TABLET | Freq: Two times a day (BID) | ORAL | 0 refills | Status: DC

## 2024-04-09 MED ORDER — TIZANIDINE HCL 4 MG PO CAPS: 4.0000 mg | ORAL_CAPSULE | Freq: Three times a day (TID) | ORAL | 1 refills | Status: AC

## 2024-04-09 MED ORDER — SODIUM CHLORIDE (PF) 0.9 % IJ SOLN
2.0000 mL | Freq: Once | INTRAMUSCULAR | Status: AC
  Administered 2024-04-09: 2 mL

## 2024-04-09 MED ORDER — ONABOTULINUMTOXINA 100 UNITS IJ SOLR
100.0000 [IU] | Freq: Once | INTRAMUSCULAR | Status: AC
  Administered 2024-04-09: 100 [IU] via INTRAMUSCULAR

## 2024-04-09 NOTE — Progress Notes (Signed)
 Subjective:    Patient ID: Jason Figueroa, male    DOB: 1965/08/19, 58 y.o.   MRN: [id number]  HPI   Jason Figueroa is a 58 y.o. year old male  who  has a past medical history of AKI (acute kidney injury), Hypertension, and Stroke (HCC).   They are presenting to PM&R clinic as a new patient for pain management evaluation. They were referred by Dr. Debby Blanch for treatment of muscle spasms.  Thank you for the referral of this very pleasant patient.  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  Appears this occurred in 2023 February.  Patient is accompanied by a male family member.  Patient reports he has been having a pain in left leg for the last couple years.  Pain is reported as pulling or cramping, however he also says it is difficult to really describe accurately.  He uses a percussion/vibration massage device that helps reduce his pain.  Patient reports that his pain is not associated with visible muscle spasms, muscle tightness or difficulty moving the joints of his legs.  Pain is worse at night makes it difficult for him to sleep.  Sometimes pain improves as he is ambulating with his walker.  Patient does have muscle tightness in his left arm greater than left leg.  Patient reports baclofen  pump is being considered.  Patient also drags his left foot sometimes when he walks.   Red flag symptoms: No red flags for back pain endorsed in Hx or ROS  Medications tried: Patient reports he has not tried many medications although it appears ropinirole  may have been used without reported benefit. Patient reports he had a reaction to Lyrica  in the past?  Other treatments: PT-denies for this condition Massage device is helpful   Prior UDS results:     Component Value Date/Time   LABOPIA NONE DETECTED 11/24/2022 0311   COCAINSCRNUR NONE DETECTED 11/24/2022 0311   LABBENZ NONE DETECTED 11/24/2022 0311   AMPHETMU  NONE DETECTED 11/24/2022 0311   THCU NONE DETECTED 11/24/2022 0311   LABBARB NONE DETECTED 11/24/2022 0311    Interval History 02/27/24 Reports nocturnal left lower extremity pains, described as a pulling and painful sensation. Spasms are severe enough to disrupt sleep, keeping him up all night. They typically resolve around 7 AM. During the day, pain is infrequent.    Pain/spasms may be associated visible foot dorsiflexion and toe curling.  Although patient says he does not see them so is not sure if this is truly going on.  Reports no benefit with gabapentin .  Pain is located in the calf, both anteriorly and posteriorly. The pain can also travel up the leg. Reports some pain episodes/spasms in the left shoulder as well, which can cause the arm to pull upwards. Getting up and walking, or using a massager, provides temporary relief. Restless legs are also reported but described as a different sensation. Denies any history of liver disease.  History of past stroke affecting the left side.  Interval History 04/09/24 The patient presents for follow-up and consideration of botulinum toxin (Botox) injections for post-stroke spasticity. He reports stiffness and difficulty with walking, particularly upon waking. He will getting tightness in his toes and ankles. The calf is primary location of his pain.  He also has limited function of his left arm, with stiff fingers that tend to draw up. He previously tried tizanidine  with minimal benefit. He tired valium  and xanax (not prescribed for him). The valium did not the xanax did help his pain. He notes some redness and swelling on one foot, thinks may be due to bug bite. He has appointment with his primary care physician tomorrow.   Pain Inventory Average Pain 3 Pain Right Now 0 My pain is sharp  In the last 24 hours, has pain interfered with the following? General activity 6 Relation with others 1 Enjoyment of life 1 What TIME of day is your pain at  its worst? varies Sleep (in general) NA  Pain is worse with: sitting Pain improves with: therapy/exercise Relief from Meds: 0    Family History  Problem Relation Age of Onset   Diverticulitis Mother    Alcoholism Father    Alcoholism Sister    Social History   Socioeconomic History   Marital status: Unknown    Spouse name: Not on file   Number of children: Not on file   Years of education: Not on file   Highest education level: Not on file  Occupational History   Not on file  Tobacco Use   Smoking status: Unknown   Smokeless tobacco: Never  Substance and Sexual Activity   Alcohol use: Not on file   Drug use: Not on file   Sexual activity: Not on file  Other Topics Concern   Not on file  Social History Narrative   ** Merged History Encounter **       Social Drivers of Health   Financial Resource Strain: Not on file  Food Insecurity: Low Risk  (01/12/2023)   Received from [facility]   Hunger Vital Sign    Within the past 12 months, you worried that your food would run out before you got money to buy more: Never true    Within the past 12 months, the food you bought just didn't last and you didn't have money to get more. : Never true  Transportation Needs: Not on file (01/12/2023)  Recent Concern: Transportation Needs - Unmet Transportation Needs (11/28/2022)   PRAPARE - Administrator, Civil Service (Medical): Yes    Lack of Transportation (Non-Medical): Yes  Physical Activity: Not on file  Stress: Not on file  Social Connections: Not on file   Past Surgical History:  Procedure Laterality Date   BACK SURGERY  1991   lumbar surgery   BUBBLE STUDY  07/28/2021   Procedure: BUBBLE STUDY;  Surgeon: Raford Riggs, MD;  Location: [facility] ENDOSCOPY;  Service: Cardiovascular;;   TEE WITHOUT CARDIOVERSION N/A 07/28/2021   Procedure: TRANSESOPHAGEAL ECHOCARDIOGRAM (TEE);  Surgeon: Raford Riggs, MD;  Location: [facility] ENDOSCOPY;  Service: Cardiovascular;   Laterality: N/A;   Past Medical History:  Diagnosis Date   AKI (acute kidney injury)    Hypertension    Stroke (HCC)    BP (!) 167/97   Pulse 71   Ht 5' 11 (1.803 m)   SpO2 94%   BMI 26.11 kg/m   Opioid Risk Score:   Fall Risk Score:  `1  Depression screen [facility] 2/9     04/09/2024   11:03 AM 02/27/2024   10:52 AM 01/02/2024    1:43 PM  Depression screen PHQ 2/9  Decreased Interest 2 2 2   Down, Depressed, Hopeless 2 0 0  PHQ - 2 Score 4 2 2   Altered sleeping   3  Tired, decreased energy   3  Change in appetite   2  Trouble concentrating   0  Moving  slowly or fidgety/restless   0  Suicidal thoughts   0  PHQ-9 Score   10  Difficult doing work/chores   Not difficult at all      Review of Systems  Endocrine:       Hyperglycemic  Musculoskeletal:  Positive for gait problem.       Left leg pain  Neurological:  Positive for weakness and numbness.       Leg spasms, left arm numbness  All other systems reviewed and are negative.      Objective:   Physical Exam  Gen: no distress, normal appearing HEENT: oral mucosa pink and moist, NCAT Chest: normal effort, normal rate of breathing Abd: soft, non-distended Ext: trace LLE edema Psych: pleasant, normal affect Skin: Small area of redness dorsal L foot Neuro: Alert and awake, follows commands, cranial nerves II through XII grossly intact RUE: 5/5 Deltoid, 5/5 Biceps, 5/5 Triceps, 5/5 Wrist Ext, 5/5 Grip LUE: 1/5 Deltoid, 1/5 Biceps, 1/5 Triceps, 1/5 Wrist Ext, 2/5 Grip RLE: HF 5/5, KE 5/5, ADF 5/5, APF 5/5 LLE: HF 4/5, KE 4/5, ADF 1-2/5, APF 1-2/5 Sensory exam normal for light touch and pain in all 4 limbs.   Left upper extremity MAS 2-3 elbow extensors, 2-3 elbow flexors, 3 finger flexors MAS 1+ hip and 1-2 knee extensors and flexors MAS 2 ankle plantar flexors and dorsiflexors + L ankle clonus Musculoskeletal:  Sitting in wheelchair      Assessment & Plan:   1) Multiple CVA hemorrhage with left  hemiparesis, upper extremity affected more than lower extremity 2) Left upper extremity greater than left lower extremity hemiparesis 3) Left lower extremity pain 4) Left foot drop 5) Mild foot cellulitis  - Patient with chronic left leg pain that occurs mostly at night.  Likely related to muscle spasms versus neuropathic pain.  Patient has difficulty describing if there is an associated muscle contraction in addition to subjective pain. - Patient denied benefit with gabapentin  100 mg, he discontinued using the medication.   - Increase tizanidine  (Zanaflex ) dose to 4mg  TID PRN.  -Advised not using medications not prescribed to him. He reports xanax was helpful.  - Prescribed Doxycycline  100 mg twice a day for 5 days for the foot redness. Advised to follow up with his primary care physician tomorrow as planned to discuss further management. - Follow up in approximately 6 weeks to assess the effects of the injection and medication changes. -Start low-dose botulinum toxin injections for lower extremity spasticity. Counseled patient that it may take a few weeks to notice an effect and we can increase the dose in the future if helpful.  Botox Injection for spasticity using needle EMG guidance   Dilution: 50 Units/ml Indication: Severe spasticity which interferes with ADL,mobility and/or  hygiene and is unresponsive to medication management and other conservative care Informed consent was obtained after describing risks and benefits of the procedure with the patient. This includes bleeding, bruising, infection, excessive weakness, or medication side effects. A REMS form is on file and signed. Needle: 28g needle electrode Number of units per muscle     B/L gastrocenemius 60 total (30 medial, 30 lateral) Soleus 40 100units total   All injections were done after obtaining appropriate EMG activity and after negative drawback for blood. The patient tolerated the procedure well. Post procedure  instructions were given. A followup appointment was

## 2024-04-11 ENCOUNTER — Telehealth: Payer: Self-pay

## 2024-04-11 NOTE — Telephone Encounter (Signed)
 PA FOR TIZANIDINE  SUBMITTED IN COVER MY MED

## 2024-04-12 NOTE — Telephone Encounter (Signed)
 Denied on October 22 by [employer] Plan requirements for the approval of a non-formulary medication are: Diagnosis of a medically-accepted indication, AND; The patient has tried and failed at least one formulary alternative, OR; The prescriber can submit a supporting   The pharmacy switched the Rx to tablet form. Patient  already been informed. No PA needed.

## 2024-04-13 MED ORDER — TIZANIDINE HCL 4 MG PO TABS: 4.0000 mg | ORAL_TABLET | Freq: Three times a day (TID) | ORAL | 1 refills | Status: DC | PRN

## 2024-04-13 MED ORDER — TIZANIDINE HCL 4 MG PO TABS
4.0000 mg | ORAL_TABLET | Freq: Three times a day (TID) | ORAL | 0 refills | Status: DC | PRN
Start: 2024-04-13 — End: 2024-04-13

## 2024-04-13 NOTE — Addendum Note (Signed)
 Addended by: URBANO ALBRIGHT on: 04/13/2024 11:25 PM   Modules accepted: Orders

## 2024-05-21 ENCOUNTER — Encounter: Admitting: Physical Medicine & Rehabilitation

## 2024-07-02 ENCOUNTER — Encounter: Payer: Self-pay | Admitting: Physical Medicine & Rehabilitation

## 2024-07-02 ENCOUNTER — Encounter: Attending: Physical Medicine & Rehabilitation | Admitting: Physical Medicine & Rehabilitation

## 2024-07-02 VITALS — BP 135/80 | HR 66 | Ht 71.0 in | Wt 203.8 lb

## 2024-07-02 DIAGNOSIS — R252 Cramp and spasm: Secondary | ICD-10-CM | POA: Insufficient documentation

## 2024-07-02 DIAGNOSIS — M25562 Pain in left knee: Secondary | ICD-10-CM | POA: Diagnosis not present

## 2024-07-02 DIAGNOSIS — I6340 Cerebral infarction due to embolism of unspecified cerebral artery: Secondary | ICD-10-CM | POA: Insufficient documentation

## 2024-07-02 MED ORDER — TIZANIDINE HCL 4 MG PO TABS: 4.0000 mg | ORAL_TABLET | Freq: Three times a day (TID) | ORAL | 1 refills | Status: AC | PRN

## 2024-07-02 NOTE — Progress Notes (Signed)
 "  Subjective:    Patient ID: Jason Figueroa, male    DOB: 19-Oct-1965, 59 y.o.   MRN: [id number]  HPI   Jason Figueroa is a 59 y.o. year old male  who  has a past medical history of AKI (acute kidney injury), Hypertension, and Stroke (HCC).   They are presenting to PM&R clinic as a new patient for pain management evaluation. They were referred by Dr. Debby Blanch for treatment of muscle spasms.  Thank you for the referral of this very pleasant patient.  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  Appears this occurred in 2023 February.  Patient is accompanied by a male family member.  Patient reports he has been having a pain in left leg for the last couple years.  Pain is reported as pulling or cramping, however he also says it is difficult to really describe accurately.  He uses a percussion/vibration massage device that helps reduce his pain.  Patient reports that his pain is not associated with visible muscle spasms, muscle tightness or difficulty moving the joints of his legs.  Pain is worse at night makes it difficult for him to sleep.  Sometimes pain improves as he is ambulating with his walker.  Patient does have muscle tightness in his left arm greater than left leg.  Patient reports baclofen  pump is being considered.  Patient also drags his left foot sometimes when he walks.   Red flag symptoms: No red flags for back pain endorsed in Hx or ROS  Medications tried: Patient reports he has not tried many medications although it appears ropinirole  may have been used without reported benefit. Patient reports he had a reaction to Lyrica  in the past?  Other treatments: PT-denies for this condition Massage device is helpful   Prior UDS results:     Component Value Date/Time   LABOPIA NONE DETECTED 11/24/2022 0311   COCAINSCRNUR NONE DETECTED 11/24/2022 0311   LABBENZ NONE DETECTED 11/24/2022 0311   AMPHETMU  NONE DETECTED 11/24/2022 0311   THCU NONE DETECTED 11/24/2022 0311   LABBARB NONE DETECTED 11/24/2022 0311    Interval History 02/27/24 Reports nocturnal left lower extremity pains, described as a pulling and painful sensation. Spasms are severe enough to disrupt sleep, keeping him up all night. They typically resolve around 7 AM. During the day, pain is infrequent.    Pain/spasms may be associated visible foot dorsiflexion and toe curling.  Although patient says he does not see them so is not sure if this is truly going on.  Reports no benefit with gabapentin .  Pain is located in the calf, both anteriorly and posteriorly. The pain can also travel up the leg. Reports some pain episodes/spasms in the left shoulder as well, which can cause the arm to pull upwards. Getting up and walking, or using a massager, provides temporary relief. Restless legs are also reported but described as a different sensation. Denies any history of liver disease.  History of past stroke affecting the left side.  Interval History 04/09/24 The patient presents for follow-up and consideration of botulinum toxin (Botox ) injections for post-stroke spasticity. He reports stiffness and difficulty with walking, particularly upon waking. He will getting tightness in his toes and ankles. The calf is primary location of his pain.  He also has limited function of his left arm, with stiff fingers that tend to draw up. He previously tried tizanidine  with minimal benefit. He tired  valium and xanax (not prescribed for him). The valium did not the xanax did help his pain. He notes some redness and swelling on one foot, thinks may be due to bug bite. He has appointment with his primary care physician tomorrow.  Interval History 07/02/24 The patient presents for a follow-up visit to assess the efficacy of his last Botox  injection for muscle spasms, which was administered in October. He reports the injection provided good relief for  approximately three weeks before the effects faded. He is interested in receiving another injection.  The last tizanidine  increase last visit provided some additional benefit for his spasms. He also mentions trying natural remedies for cramps, including cayenne pepper.   Pain Inventory Average Pain 3 Pain Right Now 0 My pain is sharp  In the last 24 hours, has pain interfered with the following? General activity 6 Relation with others 1 Enjoyment of life 1 What TIME of day is your pain at its worst? varies Sleep (in general) NA  Pain is worse with: sitting Pain improves with: therapy/exercise Relief from Meds: 0    Family History  Problem Relation Age of Onset   Diverticulitis Mother    Alcoholism Father    Alcoholism Sister    Social History   Socioeconomic History   Marital status: Unknown    Spouse name: Not on file   Number of children: Not on file   Years of education: Not on file   Highest education level: Not on file  Occupational History   Not on file  Tobacco Use   Smoking status: Unknown   Smokeless tobacco: Never  Substance and Sexual Activity   Alcohol use: Not on file   Drug use: Not on file   Sexual activity: Not on file  Other Topics Concern   Not on file  Social History Narrative   ** Merged History Encounter **       Social Drivers of Health   Tobacco Use: Medium Risk (05/14/2024)   Received from [facility]   Patient History    Smoking Tobacco Use: Former    Smokeless Tobacco Use: Former    Passive Exposure: Not on Actuary Strain: Not on file  Food Insecurity: Low Risk (01/12/2023)   Received from [facility]   Epic    Within the past 12 months, you worried that your food would run out before you got money to buy more: Never true    Within the past 12 months, the food you bought just didn't last and you didn't have money to get more. : Never true  Transportation Needs: No Transportation Needs (01/12/2023)    Received from [facility]    In the past 12 months, has lack of reliable transportation kept you from medical appointments, meetings, work or from getting things needed for daily living? : No  Recent Concern: Transportation Needs - Unmet Transportation Needs (11/28/2022)   PRAPARE - Administrator, Civil Service (Medical): Yes    Lack of Transportation (Non-Medical): Yes  Physical Activity: Not on file  Stress: Not on file  Social Connections: Not on file  Depression (PHQ2-9): Low Risk (04/09/2024)   Depression (PHQ2-9)    PHQ-2 Score: 4  Alcohol Screen: Not on file  Housing: Low Risk (01/12/2023)   Received from [facility]   Epic    What is your living situation today?: I have a steady place to live    Think about the place you  live. Do you have problems with any of the following? Choose all that apply:: None/None on this list  Utilities: Unknown (01/12/2023)   Received from [facility]   Utilities    In the past 12 months has the electric, gas, oil, or water company threatened to shut off services in your home? : Patient unable to answer  Health Literacy: Not on file   Past Surgical History:  Procedure Laterality Date   BACK SURGERY  1991   lumbar surgery   BUBBLE STUDY  07/28/2021   Procedure: BUBBLE STUDY;  Surgeon: Raford Riggs, MD;  Location: [facility] ENDOSCOPY;  Service: Cardiovascular;;   TEE WITHOUT CARDIOVERSION N/A 07/28/2021   Procedure: TRANSESOPHAGEAL ECHOCARDIOGRAM (TEE);  Surgeon: Raford Riggs, MD;  Location[facility] ENDOSCOPY;  Service: Cardiovascular;  Laterality: N/A;   Past Medical History:  Diagnosis Date   AKI (acute kidney injury)    Hypertension    Stroke (HCC)    There were no vitals taken for this visit.  Opioid Risk Score:   Fall Risk Score:  `1  Depression screen [facility] 2/9     04/09/2024   11:03 AM 02/27/2024   10:52 AM 01/02/2024    1:43 PM  Depression screen PHQ 2/9  Decreased Interest 2 2 2   Down, Depressed,  Hopeless 2 0 0  PHQ - 2 Score 4 2 2   Altered sleeping   3  Tired, decreased energy   3  Change in appetite   2  Trouble concentrating   0  Moving slowly or fidgety/restless   0  Suicidal thoughts   0  PHQ-9 Score   10   Difficult doing work/chores   Not difficult at all     Data saved with a previous flowsheet row definition      Review of Systems  Endocrine:       Hyperglycemic  Musculoskeletal:  Positive for gait problem.       Left leg pain  Neurological:  Positive for weakness and numbness.       Leg spasms, left arm numbness  All other systems reviewed and are negative.      Objective:   Physical Exam  Gen: no distress, normal appearing HEENT: oral mucosa pink and moist, NCAT Chest: normal effort, normal rate of breathing Abd: soft, non-distended Ext: trace LLE edema Psych: pleasant, normal affect Skin: warm and dry Neuro: Alert and awake, follows commands, cranial nerves II through XII grossly intact RUE: 5/5 Deltoid, 5/5 Biceps, 5/5 Triceps, 5/5 Wrist Ext, 5/5 Grip LUE: 1/5 Deltoid, 1/5 Biceps, 1/5 Triceps, 1/5 Wrist Ext, 2/5 Grip RLE: HF 5/5, KE 5/5, ADF 5/5, APF 5/5 LLE: HF 4/5, KE 4/5, ADF 1-2/5, APF 1-2/5 Sensory exam normal for light touch in all 4 limbs.   Left upper extremity MAS 2 elbow extensors, 3 elbow flexors, 3 finger flexors MAS 1+ hip and 1-2 knee extensors and flexors MAS 2 ankle plantar flexors and dorsiflexors No L ankle clonus today Musculoskeletal:  Sitting in wheelchair      Assessment & Plan:   1) Multiple CVA hemorrhage with left hemiparesis, upper extremity affected more than lower extremity 2) Left upper extremity greater than left lower extremity hemiparesis 3) Left lower extremity pain 4) Left foot drop  - Patient with chronic left leg pain that occurs mostly at night.  Likely related to muscle spasms versus neuropathic pain.  Patient has difficulty describing if there is an associated muscle contraction in addition to  subjective pain. - Patient denied benefit  with gabapentin  100 mg,  Discontinued - Continue tizanidine  (Zanaflex )  4mg  TID PRN.  -Advised not using medications not prescribed to him. He reports xanax was helpful.  -Schedule for repeat botulinum toxin injections for lower extremity spasticity.  Will plan to increase dose with next treatment.  "

## 2024-07-26 ENCOUNTER — Encounter: Admitting: Physical Medicine & Rehabilitation
